# Patient Record
Sex: Female | Born: 1945 | Race: White | Hispanic: No | Marital: Single | Smoking: Former smoker
Health system: Southern US, Community
[De-identification: ages and names within clinical notes are randomized; demographics above are authoritative.]

## PROBLEM LIST (undated history)

## (undated) DIAGNOSIS — C569 Malignant neoplasm of unspecified ovary: Secondary | ICD-10-CM

## (undated) DIAGNOSIS — I82409 Acute embolism and thrombosis of unspecified deep veins of unspecified lower extremity: Secondary | ICD-10-CM

## (undated) DIAGNOSIS — N179 Acute kidney failure, unspecified: Secondary | ICD-10-CM

## (undated) HISTORY — PX: TONSILLECTOMY: SUR1361

## (undated) HISTORY — DX: Acute kidney failure, unspecified: N17.9

## (undated) HISTORY — PX: OTHER SURGICAL HISTORY: SHX169

## (undated) HISTORY — DX: Acute embolism and thrombosis of unspecified deep veins of unspecified lower extremity: I82.409

---

## 2004-12-30 HISTORY — PX: OTHER SURGICAL HISTORY: SHX169

## 2005-03-29 ENCOUNTER — Ambulatory Visit (HOSPITAL_BASED_OUTPATIENT_CLINIC_OR_DEPARTMENT_OTHER): Admission: RE | Admit: 2005-03-29 | Discharge: 2005-03-29 | Payer: Self-pay | Admitting: Orthopedic Surgery

## 2005-03-29 ENCOUNTER — Ambulatory Visit (HOSPITAL_COMMUNITY): Admission: RE | Admit: 2005-03-29 | Discharge: 2005-03-29 | Payer: Self-pay | Admitting: Orthopedic Surgery

## 2018-12-27 ENCOUNTER — Inpatient Hospital Stay (HOSPITAL_COMMUNITY)
Admission: EM | Admit: 2018-12-27 | Discharge: 2019-01-08 | DRG: 253 | Disposition: A | Discharge status: Skilled Nursing Facility | Payer: Medicare Other | Attending: Internal Medicine | Admitting: Internal Medicine

## 2018-12-27 ENCOUNTER — Emergency Department (HOSPITAL_BASED_OUTPATIENT_CLINIC_OR_DEPARTMENT_OTHER): Payer: Medicare Other

## 2018-12-27 ENCOUNTER — Other Ambulatory Visit: Payer: Self-pay

## 2018-12-27 ENCOUNTER — Emergency Department (HOSPITAL_COMMUNITY): Payer: Medicare Other

## 2018-12-27 ENCOUNTER — Encounter (HOSPITAL_COMMUNITY): Payer: Self-pay

## 2018-12-27 DIAGNOSIS — R180 Malignant ascites: Secondary | ICD-10-CM | POA: Diagnosis present

## 2018-12-27 DIAGNOSIS — D72829 Elevated white blood cell count, unspecified: Secondary | ICD-10-CM | POA: Diagnosis present

## 2018-12-27 DIAGNOSIS — I82409 Acute embolism and thrombosis of unspecified deep veins of unspecified lower extremity: Secondary | ICD-10-CM | POA: Diagnosis present

## 2018-12-27 DIAGNOSIS — R64 Cachexia: Secondary | ICD-10-CM | POA: Diagnosis present

## 2018-12-27 DIAGNOSIS — I82412 Acute embolism and thrombosis of left femoral vein: Secondary | ICD-10-CM | POA: Diagnosis present

## 2018-12-27 DIAGNOSIS — D638 Anemia in other chronic diseases classified elsewhere: Secondary | ICD-10-CM | POA: Diagnosis present

## 2018-12-27 DIAGNOSIS — I824Y9 Acute embolism and thrombosis of unspecified deep veins of unspecified proximal lower extremity: Secondary | ICD-10-CM

## 2018-12-27 DIAGNOSIS — Z23 Encounter for immunization: Secondary | ICD-10-CM | POA: Diagnosis not present

## 2018-12-27 DIAGNOSIS — R188 Other ascites: Secondary | ICD-10-CM

## 2018-12-27 DIAGNOSIS — R1907 Generalized intra-abdominal and pelvic swelling, mass and lump: Secondary | ICD-10-CM

## 2018-12-27 DIAGNOSIS — I824Y2 Acute embolism and thrombosis of unspecified deep veins of left proximal lower extremity: Secondary | ICD-10-CM

## 2018-12-27 DIAGNOSIS — R1900 Intra-abdominal and pelvic swelling, mass and lump, unspecified site: Secondary | ICD-10-CM

## 2018-12-27 DIAGNOSIS — K5900 Constipation, unspecified: Secondary | ICD-10-CM | POA: Diagnosis present

## 2018-12-27 DIAGNOSIS — E440 Moderate protein-calorie malnutrition: Secondary | ICD-10-CM | POA: Diagnosis present

## 2018-12-27 DIAGNOSIS — E860 Dehydration: Secondary | ICD-10-CM | POA: Diagnosis present

## 2018-12-27 DIAGNOSIS — Z87891 Personal history of nicotine dependence: Secondary | ICD-10-CM | POA: Diagnosis not present

## 2018-12-27 DIAGNOSIS — Z6827 Body mass index (BMI) 27.0-27.9, adult: Secondary | ICD-10-CM

## 2018-12-27 DIAGNOSIS — N838 Other noninflammatory disorders of ovary, fallopian tube and broad ligament: Secondary | ICD-10-CM

## 2018-12-27 DIAGNOSIS — C569 Malignant neoplasm of unspecified ovary: Secondary | ICD-10-CM | POA: Diagnosis present

## 2018-12-27 DIAGNOSIS — N179 Acute kidney failure, unspecified: Secondary | ICD-10-CM | POA: Diagnosis present

## 2018-12-27 DIAGNOSIS — R0602 Shortness of breath: Secondary | ICD-10-CM

## 2018-12-27 DIAGNOSIS — Z880 Allergy status to penicillin: Secondary | ICD-10-CM | POA: Diagnosis not present

## 2018-12-27 HISTORY — DX: Malignant neoplasm of unspecified ovary: C56.9

## 2018-12-27 LAB — CBC WITH DIFFERENTIAL/PLATELET
Abs Immature Granulocytes: 0.11 10*3/uL — ABNORMAL HIGH (ref 0.00–0.07)
Basophils Absolute: 0 10*3/uL (ref 0.0–0.1)
Basophils Relative: 0 %
Eosinophils Absolute: 0 10*3/uL (ref 0.0–0.5)
Eosinophils Relative: 0 %
HCT: 44.6 % (ref 36.0–46.0)
Hemoglobin: 14.2 g/dL (ref 12.0–15.0)
Immature Granulocytes: 1 %
LYMPHS ABS: 0.4 10*3/uL — AB (ref 0.7–4.0)
Lymphocytes Relative: 3 %
MCH: 29.1 pg (ref 26.0–34.0)
MCHC: 31.8 g/dL (ref 30.0–36.0)
MCV: 91.4 fL (ref 80.0–100.0)
Monocytes Absolute: 1.2 10*3/uL — ABNORMAL HIGH (ref 0.1–1.0)
Monocytes Relative: 9 %
NEUTROS PCT: 87 %
Neutro Abs: 11.2 10*3/uL — ABNORMAL HIGH (ref 1.7–7.7)
Platelets: 172 10*3/uL (ref 150–400)
RBC: 4.88 MIL/uL (ref 3.87–5.11)
RDW: 13.3 % (ref 11.5–15.5)
WBC: 13 10*3/uL — ABNORMAL HIGH (ref 4.0–10.5)
nRBC: 0 % (ref 0.0–0.2)

## 2018-12-27 LAB — COMPREHENSIVE METABOLIC PANEL
ALT: 21 U/L (ref 0–44)
AST: 32 U/L (ref 15–41)
Albumin: 3.6 g/dL (ref 3.5–5.0)
Alkaline Phosphatase: 84 U/L (ref 38–126)
Anion gap: 17 — ABNORMAL HIGH (ref 5–15)
BUN: 38 mg/dL — ABNORMAL HIGH (ref 8–23)
CO2: 20 mmol/L — AB (ref 22–32)
Calcium: 10.1 mg/dL (ref 8.9–10.3)
Chloride: 101 mmol/L (ref 98–111)
Creatinine, Ser: 1.87 mg/dL — ABNORMAL HIGH (ref 0.44–1.00)
GFR calc Af Amer: 30 mL/min — ABNORMAL LOW (ref >60)
GFR calc non Af Amer: 26 mL/min — ABNORMAL LOW (ref >60)
GLUCOSE: 133 mg/dL — AB (ref 70–99)
Potassium: 4.7 mmol/L (ref 3.5–5.1)
SODIUM: 138 mmol/L (ref 135–145)
Total Bilirubin: 0.9 mg/dL (ref 0.3–1.2)
Total Protein: 9.2 g/dL — ABNORMAL HIGH (ref 6.5–8.1)

## 2018-12-27 LAB — PROTIME-INR
INR: 1.22
Prothrombin Time: 15.3 seconds — ABNORMAL HIGH (ref 11.4–15.2)

## 2018-12-27 LAB — LIPASE, BLOOD: Lipase: 34 U/L (ref 11–51)

## 2018-12-27 MED ORDER — ONDANSETRON HCL 4 MG/2ML IJ SOLN
4.0000 mg | Freq: Four times a day (QID) | INTRAMUSCULAR | Status: DC | PRN
  Administered 2018-12-27 – 2018-12-28 (×2): 4 mg via INTRAVENOUS
  Filled 2018-12-27 (×2): qty 2

## 2018-12-27 MED ORDER — HEPARIN (PORCINE) 25000 UT/250ML-% IV SOLN
1150.0000 [IU]/h | INTRAVENOUS | Status: DC
  Administered 2018-12-27: 1150 [IU]/h via INTRAVENOUS
  Filled 2018-12-27: qty 250

## 2018-12-27 MED ORDER — PNEUMOCOCCAL VAC POLYVALENT 25 MCG/0.5ML IJ INJ
0.5000 mL | INJECTION | INTRAMUSCULAR | Status: DC
  Filled 2018-12-27: qty 0.5

## 2018-12-27 MED ORDER — HYDRALAZINE HCL 20 MG/ML IJ SOLN
10.0000 mg | Freq: Four times a day (QID) | INTRAMUSCULAR | Status: DC | PRN
  Administered 2018-12-27: 10 mg via INTRAVENOUS
  Filled 2018-12-27: qty 1

## 2018-12-27 MED ORDER — HEPARIN BOLUS VIA INFUSION
2000.0000 [IU] | Freq: Once | INTRAVENOUS | Status: AC
  Administered 2018-12-27: 2000 [IU] via INTRAVENOUS
  Filled 2018-12-27: qty 2000

## 2018-12-27 MED ORDER — SODIUM CHLORIDE 0.9 % IV SOLN
INTRAVENOUS | Status: DC
  Administered 2018-12-27 – 2018-12-31 (×9): via INTRAVENOUS

## 2018-12-27 MED ORDER — BISMUTH SUBSALICYLATE 262 MG/15ML PO SUSP: 30.0000 mL | ORAL | Status: DC | PRN

## 2018-12-27 MED ORDER — ACETAMINOPHEN 650 MG RE SUPP: 650.0000 mg | Freq: Four times a day (QID) | RECTAL | Status: DC | PRN

## 2018-12-27 MED ORDER — ONDANSETRON HCL 4 MG/2ML IJ SOLN
4.0000 mg | Freq: Once | INTRAMUSCULAR | Status: DC
  Filled 2018-12-27: qty 2

## 2018-12-27 MED ORDER — ONDANSETRON HCL 4 MG PO TABS: 4.0000 mg | ORAL_TABLET | Freq: Four times a day (QID) | ORAL | Status: DC | PRN

## 2018-12-27 MED ORDER — ACETAMINOPHEN 325 MG PO TABS
650.0000 mg | ORAL_TABLET | Freq: Four times a day (QID) | ORAL | Status: DC | PRN
  Filled 2018-12-27: qty 2

## 2018-12-27 NOTE — ED Notes (Signed)
Patient transported to CT 

## 2018-12-27 NOTE — Consult Note (Signed)
Reason for Consult: Newly diagnosed abdominal/pelvic mass Referring Physician: Lennice Sites, DO  Haley Palmer is an 72 y.o. female.  HPI:  She presented to the ED with increasing abdominal girth and LLE swelling for several months. She has associated DOE and decreased appetite. She endorses difficulty with AOD. She denies N/V, early satiety, bloating, weight loss, C/P.   Labs are suggestive of renal insufficiency--possible AKI/prerenal.  A CT scan showed a large abdominal/pelvic mass, 29 cm--felt to originate possibly from the right adnexa with large volume ascites, no lymphadenopathy.  Multiple pulmonary nodules were noted and were not felt to represent metastatic disease.  Duplex doppler study of the LE revealed a LLE DVT.  There is a family h/o colon and breast malignancies. History reviewed. No pertinent past medical history.  Past Surgical History:  Procedure Laterality Date  . CESAREAN SECTION    . Cyst removal on Left Wrist Left   . Surgery on Right Wrist Right 2006    No family history on file.  Social History:  reports that she has quit smoking. Her smoking use included cigarettes. She smoked 1.00 pack per day. She has never used smokeless tobacco. She reports that she does not drink alcohol or use drugs.  Allergies:  Allergies  Allergen Reactions  . Penicillins Rash    DID THE REACTION INVOLVE: Swelling of the face/tongue/throat, SOB, or low BP? No Sudden or severe rash/hives, skin peeling, or the inside of the mouth or nose? No Did it require medical treatment? No When did it last happen?childhood allergy If all above answers are "NO", may proceed with cephalosporin use.     Medications: I have reviewed the patient's current medications.  Results for orders placed or performed during the hospital encounter of 12/27/18 (from the past 48 hour(s))  Comprehensive metabolic panel     Status: Abnormal   Collection Time: 12/27/18 11:08 AM  Result Value Ref Range    Sodium 138 135 - 145 mmol/L   Potassium 4.7 3.5 - 5.1 mmol/L   Chloride 101 98 - 111 mmol/L   CO2 20 (L) 22 - 32 mmol/L   Glucose, Bld 133 (H) 70 - 99 mg/dL   BUN 38 (H) 8 - 23 mg/dL   Creatinine, Ser 1.87 (H) 0.44 - 1.00 mg/dL   Calcium 10.1 8.9 - 10.3 mg/dL   Total Protein 9.2 (H) 6.5 - 8.1 g/dL   Albumin 3.6 3.5 - 5.0 g/dL   AST 32 15 - 41 U/L   ALT 21 0 - 44 U/L   Alkaline Phosphatase 84 38 - 126 U/L   Total Bilirubin 0.9 0.3 - 1.2 mg/dL   GFR calc non Af Amer 26 (L) >60 mL/min   GFR calc Af Amer 30 (L) >60 mL/min   Anion gap 17 (H) 5 - 15    Comment: Performed at [facility], [place] [address], [place], [place] [address]  Lipase, blood     Status: None   Collection Time: 12/27/18 11:08 AM  Result Value Ref Range   Lipase 34 11 - 51 U/L    Comment: Performed at [facility], [place] [address] [place], [place] [address]  CBC with Diff     Status: Abnormal   Collection Time: 12/27/18 11:08 AM  Result Value Ref Range   WBC 13.0 (H) 4.0 - 10.5 K/uL   RBC 4.88 3.87 - 5.11 MIL/uL   Hemoglobin 14.2 12.0 - 15.0 g/dL   HCT 44.6 36.0 - 46.0 %  MCV 91.4 80.0 - 100.0 fL   MCH 29.1 26.0 - 34.0 pg   MCHC 31.8 30.0 - 36.0 g/dL   RDW 13.3 11.5 - 15.5 %   Platelets 172 150 - 400 K/uL   nRBC 0.0 0.0 - 0.2 %   Neutrophils Relative % 87 %   Neutro Abs 11.2 (H) 1.7 - 7.7 K/uL   Lymphocytes Relative 3 %   Lymphs Abs 0.4 (L) 0.7 - 4.0 K/uL   Monocytes Relative 9 %   Monocytes Absolute 1.2 (H) 0.1 - 1.0 K/uL   Eosinophils Relative 0 %   Eosinophils Absolute 0.0 0.0 - 0.5 K/uL   Basophils Relative 0 %   Basophils Absolute 0.0 0.0 - 0.1 K/uL   Immature Granulocytes 1 %   Abs Immature Granulocytes 0.11 (H) 0.00 - 0.07 K/uL    Comment: Performed at [facility], [place] [address], [place], [place] [address]  Protime-INR     Status: Abnormal   Collection Time: 12/27/18 11:08 AM  Result Value Ref Range   Prothrombin Time 15.3 (H) 11.4 -  15.2 seconds   INR 1.22     Comment: Performed at [facility], [place] [address], [place], [place] [address]    Ct Abdomen Pelvis Wo Contrast  Result Date: 12/27/2018 CLINICAL DATA:  72 year old female with history of abdominal distension. Evaluate for potential mass. EXAM: CT CHEST, ABDOMEN AND PELVIS WITHOUT CONTRAST TECHNIQUE: Multidetector CT imaging of the chest, abdomen and pelvis was performed following the standard protocol without IV contrast. COMPARISON:  None. FINDINGS: CT CHEST FINDINGS Cardiovascular: Heart size is normal. There is no significant pericardial fluid, thickening or pericardial calcification. Atherosclerotic calcifications in the left anterior descending coronary artery. Mediastinum/Nodes: No pathologically enlarged mediastinal or hilar lymph nodes. Moderate-sized hiatal hernia. No axillary lymphadenopathy. Lungs/Pleura: Mild diffuse bronchial wall thickening with mild centrilobular and paraseptal emphysema. Innumerable tiny 1-3 mm pulmonary nodules scattered throughout the periphery of the lungs bilaterally, nonspecific. No larger more suspicious appearing pulmonary nodules or masses are noted. No acute consolidative airspace disease. No pleural effusions. Areas of linear scarring noted in the right middle lobe, inferior segment of the lingula and anterior aspect of the left lower lobe. Musculoskeletal: There are no aggressive appearing lytic or blastic lesions noted in the visualized portions of the skeleton. CT ABDOMEN PELVIS FINDINGS Hepatobiliary: No definite cystic or solid hepatic lesions are confidently identified on today's noncontrast CT examination. Gallbladder is not confidently identified may be surgically absent, or may be obscured by adjacent ascites. Pancreas: No definite pancreatic mass noted on today's noncontrast CT examination. No definite surrounding peripancreatic inflammatory changes. Spleen: Unremarkable. Adrenals/Urinary Tract: Unenhanced  appearance of the kidneys and bilateral adrenal glands is normal. No hydroureteronephrosis. Urinary bladder is poorly demonstrated, but unremarkable in appearance. Stomach/Bowel: Normal appearance of the stomach. No pathologic dilatation of small bowel or colon. Appendix is not confidently identified. Vascular/Lymphatic: Aortic atherosclerosis. No lymphadenopathy identified in the abdomen or pelvis on today's noncontrast CT examination. Reproductive: Unenhanced appearance of the uterus is unremarkable. In the central abdomen and pelvis there is a very large centrally low-attenuation rim enhancing mass (axial image 85 of series 2 and sagittal image 92 of series 7) measuring 28.7 x 25.6 x 25.6 cm. This appears potentially related to the right adnexal region, although the origin of this lesion is uncertain. Left ovary is not confidently identified. Other: Large volume of ascites.  No pneumoperitoneum. Musculoskeletal: There are no aggressive appearing lytic or blastic lesions noted in  the visualized portions of the skeleton. IMPRESSION: 1. 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 This is associated with a very large volume of (presumably malignant) ascites. 2. Multiple tiny 1-3 mm pulmonary nodules scattered throughout the lungs bilaterally. These are nonspecific, but favored to represent benign areas of mucoid impaction within terminal bronchioles. However, close attention on follow-up studies is recommended to ensure the stability or resolution of this finding, as the possibility of metastatic disease is not excluded. 3. Aortic atherosclerosis, in addition to left anterior descending coronary artery disease. Assessment for potential risk factor modification, dietary therapy or pharmacologic therapy may be warranted, if clinically indicated. Electronically Signed   By: Vinnie Langton M.D.   On: 12/27/2018 13:41    Ct Chest Wo Contrast  Result Date: 12/27/2018 CLINICAL DATA:  72 year old female with history of abdominal distension. Evaluate for potential mass. EXAM: CT CHEST, ABDOMEN AND PELVIS WITHOUT CONTRAST TECHNIQUE: Multidetector CT imaging of the chest, abdomen and pelvis was performed following the standard protocol without IV contrast. COMPARISON:  None. FINDINGS: CT CHEST FINDINGS Cardiovascular: Heart size is normal. There is no significant pericardial fluid, thickening or pericardial calcification. Atherosclerotic calcifications in the left anterior descending coronary artery. Mediastinum/Nodes: No pathologically enlarged mediastinal or hilar lymph nodes. Moderate-sized hiatal hernia. No axillary lymphadenopathy. Lungs/Pleura: Mild diffuse bronchial wall thickening with mild centrilobular and paraseptal emphysema. Innumerable tiny 1-3 mm pulmonary nodules scattered throughout the periphery of the lungs bilaterally, nonspecific. No larger more suspicious appearing pulmonary nodules or masses are noted. No acute consolidative airspace disease. No pleural effusions. Areas of linear scarring noted in the right middle lobe, inferior segment of the lingula and anterior aspect of the left lower lobe. Musculoskeletal: There are no aggressive appearing lytic or blastic lesions noted in the visualized portions of the skeleton. CT ABDOMEN PELVIS FINDINGS Hepatobiliary: No definite cystic or solid hepatic lesions are confidently identified on today's noncontrast CT examination. Gallbladder is not confidently identified may be surgically absent, or may be obscured by adjacent ascites. Pancreas: No definite pancreatic mass noted on today's noncontrast CT examination. No definite surrounding peripancreatic inflammatory changes. Spleen: Unremarkable. Adrenals/Urinary Tract: Unenhanced appearance of the kidneys and bilateral adrenal glands is normal. No hydroureteronephrosis. Urinary bladder is poorly demonstrated, but  unremarkable in appearance. Stomach/Bowel: Normal appearance of the stomach. No pathologic dilatation of small bowel or colon. Appendix is not confidently identified. Vascular/Lymphatic: Aortic atherosclerosis. No lymphadenopathy identified in the abdomen or pelvis on today's noncontrast CT examination. Reproductive: Unenhanced appearance of the uterus is unremarkable. In the central abdomen and pelvis there is a very large centrally low-attenuation rim enhancing mass (axial image 85 of series 2 and sagittal image 92 of series 7) measuring 28.7 x 25.6 x 25.6 cm. This appears potentially related to the right adnexal region, although the origin of this lesion is uncertain. Left ovary is not confidently identified. Other: Large volume of ascites.  No pneumoperitoneum. Musculoskeletal: There are no aggressive appearing lytic or blastic lesions noted in the visualized portions of the skeleton. IMPRESSION: 1. 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 This is associated with a very large volume of (presumably malignant) ascites. 2. Multiple tiny 1-3 mm pulmonary nodules scattered throughout the lungs bilaterally. These are nonspecific, but favored to represent benign areas of mucoid impaction within terminal bronchioles.  However, close attention on follow-up studies is recommended to ensure the stability or resolution of this finding, as the possibility of metastatic disease is not excluded. 3. Aortic atherosclerosis, in addition to left anterior descending coronary artery disease. Assessment for potential risk factor modification, dietary therapy or pharmacologic therapy may be warranted, if clinically indicated. Electronically Signed   By: Vinnie Langton M.D.   On: 12/27/2018 13:41   Dg Chest Portable 1 View  Result Date: 12/27/2018 CLINICAL DATA:  Abdominal distension and leg swelling EXAM: PORTABLE CHEST 1 VIEW  COMPARISON:  None. FINDINGS: Monitoring leads overlie the patient. Normal cardiac and mediastinal contours. No consolidative pulmonary opacities. No pleural effusion or pneumothorax. Thoracic spine degenerative changes. IMPRESSION: No active disease. Electronically Signed   By: Lovey Newcomer M.D.   On: 12/27/2018 11:48   Vas [place] Lower Extremity Venous (dvt) (only [facility])  Result Date: 12/27/2018  Lower Venous Study Indications: Pain, Swelling, and Edema.  Limitations: Body habitus and tightenedness due to edema. Performing Technologist: Lorina Rabon  Examination Guidelines: A complete evaluation includes B-mode imaging, spectral Doppler, color Doppler, and power Doppler as needed of all accessible portions of each vessel. Bilateral testing is considered an integral part of a complete examination. Limited examinations for reoccurring indications may be performed as noted.  Right Venous Findings: +---+---------------+---------+-----------+----------+-------+    CompressibilityPhasicitySpontaneityPropertiesSummary +---+---------------+---------+-----------+----------+-------+ CFVFull           Yes      Yes                          +---+---------------+---------+-----------+----------+-------+  Left Venous Findings: +---------+---------------+---------+-----------+----------+-----------------+          CompressibilityPhasicitySpontaneityPropertiesSummary           +---------+---------------+---------+-----------+----------+-----------------+ CFV      None           No       No                   Acute             +---------+---------------+---------+-----------+----------+-----------------+ SFJ      None                                         Acute             +---------+---------------+---------+-----------+----------+-----------------+ FV Prox  None                                         Acute              +---------+---------------+---------+-----------+----------+-----------------+ FV Mid   None                                         Acute             +---------+---------------+---------+-----------+----------+-----------------+ FV DistalNone                                         Acute             +---------+---------------+---------+-----------+----------+-----------------+ PFV  None                                         Age Indeterminate +---------+---------------+---------+-----------+----------+-----------------+ POP      None                                         Age Indeterminate +---------+---------------+---------+-----------+----------+-----------------+ PTV      None                                         Age Indeterminate +---------+---------------+---------+-----------+----------+-----------------+ PERO     None                                         Age Indeterminate +---------+---------------+---------+-----------+----------+-----------------+ GSV      None                                         Acute             +---------+---------------+---------+-----------+----------+-----------------+    Summary: Right: No evidence of common femoral vein obstruction. Left: Findings consistent with acute deep vein thrombosis involving the left common femoral vein, and left femoral vein. Findings consistent with acute superficial vein thrombosis involving the left great saphenous vein. Findings consistent with age indeterminate deep vein thrombosis involving the left proximal profunda vein, left popliteal vein, left posterior tibial vein, and left peroneal vein. Notice: Left external iliac vein also thrombosed . IVC cannot well visualized. No cystic structure found in the popliteal fossa.  *See table(s) above for measurements and observations.    Preliminary     Review of Systems  Constitutional: Positive for malaise/fatigue. Negative for weight  loss.  Respiratory: Positive for shortness of breath. Negative for cough and hemoptysis.   Cardiovascular: Negative for chest pain.  Gastrointestinal: Positive for abdominal pain. Negative for constipation, nausea and vomiting.  Musculoskeletal:       LLE swelling   Blood pressure (!) 181/81, pulse (!) 108, temperature (!) 97.3 F (36.3 C), temperature source Oral, resp. rate (!) 22, height 5\' 7"  (1.702 m), weight 79.4 kg, SpO2 99 %. Physical Exam  Constitutional: She is oriented to person, place, and time. No distress.  Thin  HENT:  Head: Normocephalic and atraumatic.  Neck: Neck supple.  Cardiovascular: Regular rhythm.  Respiratory: Breath sounds normal.  GI: There is no abdominal tenderness.  Marked distension  Musculoskeletal:     Comments: Marked LLE edema pedal--thigh  Lymphadenopathy:       Right: No inguinal adenopathy present.       Left: No inguinal adenopathy present.  Neurological: She is alert and oriented to person, place, and time.    Assessment/Plan: Constellation of signs, symptoms and imaging worrisome for an EOC w/a secondary LLE DVT.  AKI. Tachycardic--?2/2 relative intravascular volume depletion; consider a possible PE in light of the DVT  >Admit, supportive care >Pharmacy has been consulted for anticoagulation therapy/dosing in light of the renal insufficiency.  (UFH) is preferred, although some experts administer renally-dosed enoxaparin- with  monitoring of antifactor Xa levels.  >recommend paracentesis by IR--therapeutic/diagnostic for cytologic examination > Likely a candidate for either up-front primary cytoreductive surgery with placement of an IVC filter prior to surgery to prevent PE or ii) neoadjuvant chemotherapy followed by interval debulking surgery.  The approach will be determined by our team >consider a nutritionist consult >review the CA-125 result >Will follow with you  Lahoma Crocker 12/27/2018, 4:12 PM

## 2018-12-27 NOTE — ED Notes (Signed)
ED TO INPATIENT HANDOFF REPORT  Name/Age/Gender Haley Palmer 72 y.o. female  Code Status   Home/SNF/Other Home  Chief Complaint Abdominal Pain; Left Leg Adema  Level of Care/Admitting Diagnosis ED Disposition    ED Disposition Condition Comment   Admit  Hospital Area: [facility] [[id number]]  Level of Care: Med-Surg [16]  Diagnosis: Acute DVT (deep venous thrombosis) [facility]) [[id number]]  Admitting Physician: Patrecia Pour [phone]  Attending Physician: Patrecia Pour [phone]  Estimated length of stay: past midnight tomorrow  Certification:: I certify this patient will need inpatient services for at least 2 midnights  PT Class (Do Not Modify): Inpatient [101]  PT Acc Code (Do Not Modify): Private [1]       Medical History History reviewed. No pertinent past medical history.  Allergies Allergies  Allergen Reactions  . Penicillins Rash    DID THE REACTION INVOLVE: Swelling of the face/tongue/throat, SOB, or low BP? No Sudden or severe rash/hives, skin peeling, or the inside of the mouth or nose? No Did it require medical treatment? No When did it last happen?childhood allergy If all above answers are "NO", may proceed with cephalosporin use.     IV Location/Drains/Wounds Patient Lines/Drains/Airways Status   Active Line/Drains/Airways    Name:   Placement date:   Placement time:   Site:   Days:   Peripheral IV 12/27/18 Left Antecubital   12/27/18    1137    Antecubital   less than 1          Labs/Imaging Results for orders placed or performed during the hospital encounter of 12/27/18 (from the past 48 hour(s))  Comprehensive metabolic panel     Status: Abnormal   Collection Time: 12/27/18 11:08 AM  Result Value Ref Range   Sodium 138 135 - 145 mmol/L   Potassium 4.7 3.5 - 5.1 mmol/L   Chloride 101 98 - 111 mmol/L   CO2 20 (L) 22 - 32 mmol/L   Glucose, Bld 133 (H) 70 - 99 mg/dL   BUN 38 (H) 8 - 23 mg/dL   Creatinine, Ser 1.87 (H) 0.44 -  1.00 mg/dL   Calcium 10.1 8.9 - 10.3 mg/dL   Total Protein 9.2 (H) 6.5 - 8.1 g/dL   Albumin 3.6 3.5 - 5.0 g/dL   AST 32 15 - 41 U/L   ALT 21 0 - 44 U/L   Alkaline Phosphatase 84 38 - 126 U/L   Total Bilirubin 0.9 0.3 - 1.2 mg/dL   GFR calc non Af Amer 26 (L) >60 mL/min   GFR calc Af Amer 30 (L) >60 mL/min   Anion gap 17 (H) 5 - 15    Comment: Performed at [facility], [place] [address] [place], [place] [address]  Lipase, blood     Status: None   Collection Time: 12/27/18 11:08 AM  Result Value Ref Range   Lipase 34 11 - 51 U/L    Comment: Performed at [facility], [place] [address] [place], [place] [address]  CBC with Diff     Status: Abnormal   Collection Time: 12/27/18 11:08 AM  Result Value Ref Range   WBC 13.0 (H) 4.0 - 10.5 K/uL   RBC 4.88 3.87 - 5.11 MIL/uL   Hemoglobin 14.2 12.0 - 15.0 g/dL   HCT 44.6 36.0 - 46.0 %   MCV 91.4 80.0 - 100.0 fL   MCH 29.1 26.0 - 34.0 pg   MCHC 31.8 30.0 - 36.0 g/dL  RDW 13.3 11.5 - 15.5 %   Platelets 172 150 - 400 K/uL   nRBC 0.0 0.0 - 0.2 %   Neutrophils Relative % 87 %   Neutro Abs 11.2 (H) 1.7 - 7.7 K/uL   Lymphocytes Relative 3 %   Lymphs Abs 0.4 (L) 0.7 - 4.0 K/uL   Monocytes Relative 9 %   Monocytes Absolute 1.2 (H) 0.1 - 1.0 K/uL   Eosinophils Relative 0 %   Eosinophils Absolute 0.0 0.0 - 0.5 K/uL   Basophils Relative 0 %   Basophils Absolute 0.0 0.0 - 0.1 K/uL   Immature Granulocytes 1 %   Abs Immature Granulocytes 0.11 (H) 0.00 - 0.07 K/uL    Comment: Performed at [facility], [place] [address], [place], [place] [address]  Protime-INR     Status: Abnormal   Collection Time: 12/27/18 11:08 AM  Result Value Ref Range   Prothrombin Time 15.3 (H) 11.4 - 15.2 seconds   INR 1.22     Comment: Performed at [facility], [place] [address], [place], [place] [address]   Ct Abdomen Pelvis Wo Contrast  Result Date: 12/27/2018 CLINICAL DATA:  72 year old female  with history of abdominal distension. Evaluate for potential mass. EXAM: CT CHEST, ABDOMEN AND PELVIS WITHOUT CONTRAST TECHNIQUE: Multidetector CT imaging of the chest, abdomen and pelvis was performed following the standard protocol without IV contrast. COMPARISON:  None. FINDINGS: CT CHEST FINDINGS Cardiovascular: Heart size is normal. There is no significant pericardial fluid, thickening or pericardial calcification. Atherosclerotic calcifications in the left anterior descending coronary artery. Mediastinum/Nodes: No pathologically enlarged mediastinal or hilar lymph nodes. Moderate-sized hiatal hernia. No axillary lymphadenopathy. Lungs/Pleura: Mild diffuse bronchial wall thickening with mild centrilobular and paraseptal emphysema. Innumerable tiny 1-3 mm pulmonary nodules scattered throughout the periphery of the lungs bilaterally, nonspecific. No larger more suspicious appearing pulmonary nodules or masses are noted. No acute consolidative airspace disease. No pleural effusions. Areas of linear scarring noted in the right middle lobe, inferior segment of the lingula and anterior aspect of the left lower lobe. Musculoskeletal: There are no aggressive appearing lytic or blastic lesions noted in the visualized portions of the skeleton. CT ABDOMEN PELVIS FINDINGS Hepatobiliary: No definite cystic or solid hepatic lesions are confidently identified on today's noncontrast CT examination. Gallbladder is not confidently identified may be surgically absent, or may be obscured by adjacent ascites. Pancreas: No definite pancreatic mass noted on today's noncontrast CT examination. No definite surrounding peripancreatic inflammatory changes. Spleen: Unremarkable. Adrenals/Urinary Tract: Unenhanced appearance of the kidneys and bilateral adrenal glands is normal. No hydroureteronephrosis. Urinary bladder is poorly demonstrated, but unremarkable in appearance. Stomach/Bowel: Normal appearance of the stomach. No pathologic  dilatation of small bowel or colon. Appendix is not confidently identified. Vascular/Lymphatic: Aortic atherosclerosis. No lymphadenopathy identified in the abdomen or pelvis on today's noncontrast CT examination. Reproductive: Unenhanced appearance of the uterus is unremarkable. In the central abdomen and pelvis there is a very large centrally low-attenuation rim enhancing mass (axial image 85 of series 2 and sagittal image 92 of series 7) measuring 28.7 x 25.6 x 25.6 cm. This appears potentially related to the right adnexal region, although the origin of this lesion is uncertain. Left ovary is not confidently identified. Other: Large volume of ascites.  No pneumoperitoneum. Musculoskeletal: There are no aggressive appearing lytic or blastic lesions noted in the visualized portions of the skeleton. IMPRESSION: 1. 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 This is associated with a very large volume of (presumably malignant) ascites. 2. Multiple tiny 1-3 mm pulmonary nodules scattered throughout the lungs bilaterally. These are nonspecific, but favored to represent benign areas of mucoid impaction within terminal bronchioles. However, close attention on follow-up studies is recommended to ensure the stability or resolution of this finding, as the possibility of metastatic disease is not excluded. 3. Aortic atherosclerosis, in addition to left anterior descending coronary artery disease. Assessment for potential risk factor modification, dietary therapy or pharmacologic therapy may be warranted, if clinically indicated. Electronically Signed   By: Vinnie Langton M.D.   On: 12/27/2018 13:41   Ct Chest Wo Contrast  Result Date: 12/27/2018 CLINICAL DATA:  72 year old female with history of abdominal distension. Evaluate for potential mass. EXAM: CT CHEST, ABDOMEN AND PELVIS WITHOUT CONTRAST TECHNIQUE:  Multidetector CT imaging of the chest, abdomen and pelvis was performed following the standard protocol without IV contrast. COMPARISON:  None. FINDINGS: CT CHEST FINDINGS Cardiovascular: Heart size is normal. There is no significant pericardial fluid, thickening or pericardial calcification. Atherosclerotic calcifications in the left anterior descending coronary artery. Mediastinum/Nodes: No pathologically enlarged mediastinal or hilar lymph nodes. Moderate-sized hiatal hernia. No axillary lymphadenopathy. Lungs/Pleura: Mild diffuse bronchial wall thickening with mild centrilobular and paraseptal emphysema. Innumerable tiny 1-3 mm pulmonary nodules scattered throughout the periphery of the lungs bilaterally, nonspecific. No larger more suspicious appearing pulmonary nodules or masses are noted. No acute consolidative airspace disease. No pleural effusions. Areas of linear scarring noted in the right middle lobe, inferior segment of the lingula and anterior aspect of the left lower lobe. Musculoskeletal: There are no aggressive appearing lytic or blastic lesions noted in the visualized portions of the skeleton. CT ABDOMEN PELVIS FINDINGS Hepatobiliary: No definite cystic or solid hepatic lesions are confidently identified on today's noncontrast CT examination. Gallbladder is not confidently identified may be surgically absent, or may be obscured by adjacent ascites. Pancreas: No definite pancreatic mass noted on today's noncontrast CT examination. No definite surrounding peripancreatic inflammatory changes. Spleen: Unremarkable. Adrenals/Urinary Tract: Unenhanced appearance of the kidneys and bilateral adrenal glands is normal. No hydroureteronephrosis. Urinary bladder is poorly demonstrated, but unremarkable in appearance. Stomach/Bowel: Normal appearance of the stomach. No pathologic dilatation of small bowel or colon. Appendix is not confidently identified. Vascular/Lymphatic: Aortic atherosclerosis. No  lymphadenopathy identified in the abdomen or pelvis on today's noncontrast CT examination. Reproductive: Unenhanced appearance of the uterus is unremarkable. In the central abdomen and pelvis there is a very large centrally low-attenuation rim enhancing mass (axial image 85 of series 2 and sagittal image 92 of series 7) measuring 28.7 x 25.6 x 25.6 cm. This appears potentially related to the right adnexal region, although the origin of this lesion is uncertain. Left ovary is not confidently identified. Other: Large volume of ascites.  No pneumoperitoneum. Musculoskeletal: There are no aggressive appearing lytic or blastic lesions noted in the visualized portions of the skeleton. IMPRESSION: 1. 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 This is associated with a very large volume of (presumably malignant) ascites. 2. Multiple tiny 1-3 mm pulmonary nodules scattered throughout the lungs bilaterally. These are nonspecific, but favored to represent benign areas of mucoid impaction within terminal bronchioles. However, close attention on follow-up studies is recommended to ensure the stability or resolution of this finding, as the possibility of metastatic disease is not  excluded. 3. Aortic atherosclerosis, in addition to left anterior descending coronary artery disease. Assessment for potential risk factor modification, dietary therapy or pharmacologic therapy may be warranted, if clinically indicated. Electronically Signed   By: Vinnie Langton M.D.   On: 12/27/2018 13:41   Dg Chest Portable 1 View  Result Date: 12/27/2018 CLINICAL DATA:  Abdominal distension and leg swelling EXAM: PORTABLE CHEST 1 VIEW COMPARISON:  None. FINDINGS: Monitoring leads overlie the patient. Normal cardiac and mediastinal contours. No consolidative pulmonary opacities. No pleural effusion or pneumothorax. Thoracic spine degenerative  changes. IMPRESSION: No active disease. Electronically Signed   By: Lovey Newcomer M.D.   On: 12/27/2018 11:48   Vas [place] Lower Extremity Venous (dvt) (only [facility])  Result Date: 12/27/2018  Lower Venous Study Indications: Pain, Swelling, and Edema.  Limitations: Body habitus and tightenedness due to edema. Performing Technologist: Lorina Rabon  Examination Guidelines: A complete evaluation includes B-mode imaging, spectral Doppler, color Doppler, and power Doppler as needed of all accessible portions of each vessel. Bilateral testing is considered an integral part of a complete examination. Limited examinations for reoccurring indications may be performed as noted.  Right Venous Findings: +---+---------------+---------+-----------+----------+-------+    CompressibilityPhasicitySpontaneityPropertiesSummary +---+---------------+---------+-----------+----------+-------+ CFVFull           Yes      Yes                          +---+---------------+---------+-----------+----------+-------+  Left Venous Findings: +---------+---------------+---------+-----------+----------+-----------------+          CompressibilityPhasicitySpontaneityPropertiesSummary           +---------+---------------+---------+-----------+----------+-----------------+ CFV      None           No       No                   Acute             +---------+---------------+---------+-----------+----------+-----------------+ SFJ      None                                         Acute             +---------+---------------+---------+-----------+----------+-----------------+ FV Prox  None                                         Acute             +---------+---------------+---------+-----------+----------+-----------------+ FV Mid   None                                         Acute             +---------+---------------+---------+-----------+----------+-----------------+ FV DistalNone                                          Acute             +---------+---------------+---------+-----------+----------+-----------------+ PFV      None  Age Indeterminate +---------+---------------+---------+-----------+----------+-----------------+ POP      None                                         Age Indeterminate +---------+---------------+---------+-----------+----------+-----------------+ PTV      None                                         Age Indeterminate +---------+---------------+---------+-----------+----------+-----------------+ PERO     None                                         Age Indeterminate +---------+---------------+---------+-----------+----------+-----------------+ GSV      None                                         Acute             +---------+---------------+---------+-----------+----------+-----------------+    Summary: Right: No evidence of common femoral vein obstruction. Left: Findings consistent with acute deep vein thrombosis involving the left common femoral vein, and left femoral vein. Findings consistent with acute superficial vein thrombosis involving the left great saphenous vein. Findings consistent with age indeterminate deep vein thrombosis involving the left proximal profunda vein, left popliteal vein, left posterior tibial vein, and left peroneal vein. Notice: Left external iliac vein also thrombosed . IVC cannot well visualized. No cystic structure found in the popliteal fossa.  *See table(s) above for measurements and observations.    Preliminary    None  Pending Labs Unresulted Labs (From admission, onward)    Start     Ordered   12/28/18 0500  CBC  Daily,   R     12/27/18 1522   12/28/18 0000  Heparin level (unfractionated)  Once-Timed,   R     12/27/18 1522   12/27/18 1402  CA 125  Once,   R     12/27/18 1401   12/27/18 1108  Urinalysis, Routine w reflex microscopic  ONCE - STAT,   STAT      12/27/18 1109          Vitals/Pain Today's Vitals   12/27/18 1454 12/27/18 1500 12/27/18 1530 12/27/18 1533  BP: 140/74 (!) 152/93 (!) 157/101 (!) 157/101  Pulse: (!) 115 (!) 110 (!) 113 (!) 115  Resp: 18 (!) 23 15 18   Temp:      TempSrc:      SpO2: 97% 98% 96% 98%  Weight:      Height:      PainSc:        Isolation Precautions No active isolations  Medications Medications  ondansetron (ZOFRAN) injection 4 mg (has no administration in time range)  heparin bolus via infusion 2,000 Units (has no administration in time range)  heparin ADULT infusion 100 units/mL (25000 units/273mL sodium chloride 0.45%) (has no administration in time range)    Mobility walks

## 2018-12-27 NOTE — ED Notes (Signed)
Pt cannot use restroom at this time, aware urine specimen is needed.  

## 2018-12-27 NOTE — ED Notes (Signed)
Hospitalist at bedside with patient.

## 2018-12-27 NOTE — ED Notes (Signed)
Report given to [facility] for [name], Room [phone].

## 2018-12-27 NOTE — ED Provider Notes (Signed)
[facility] DEPT Provider Note   CSN: [id number] Arrival date & time: 12/27/18  1035     History   Chief Complaint Chief Complaint  Patient presents with  . Abdominal Distention    HPI Haley Palmer is a 72 y.o. female.  The history is provided by the patient.  Abdominal Cramping  This is a new problem. The current episode started more than 1 week ago. The problem occurs daily. The problem has been gradually worsening. Pertinent negatives include no chest pain, no abdominal pain, no headaches and no shortness of breath. Associated symptoms comments: Patient with increasing abdominal distension and LLE swelling for the last several months. Nothing aggravates the symptoms. Nothing relieves the symptoms. She has tried nothing for the symptoms. The treatment provided no relief.    History reviewed. No pertinent past medical history.  Patient Active Problem List   Diagnosis Date Noted  . Acute DVT (deep venous thrombosis) ([place]) 12/27/2018    Past Surgical History:  Procedure Laterality Date  . CESAREAN SECTION    . Cyst removal on Left Wrist Left   . Surgery on Right Wrist Right 2006     OB History   No obstetric history on file.      Home Medications    Prior to Admission medications   Medication Sig Start Date End Date Taking? Authorizing Provider  bismuth subsalicylate (PEPTO BISMOL) 262 MG/15ML suspension Take 30 mLs by mouth every 4 (four) hours as needed for indigestion.   Yes [provider]    Family History No family history on file.  Social History Social History   Tobacco Use  . Smoking status: Former Smoker    Packs/day: 1.00    Types: Cigarettes  . Smokeless tobacco: Never Used  . Tobacco comment: Has not smoked in about 1 week as of 2018/12/27  Substance Use Topics  . Alcohol use: Never    Frequency: Never  . Drug use: Never     Allergies   Penicillins   Review of Systems Review of Systems    Constitutional: Negative for chills and fever.  HENT: Negative for ear pain and sore throat.   Eyes: Negative for pain and visual disturbance.  Respiratory: Negative for cough and shortness of breath.   Cardiovascular: Positive for leg swelling. Negative for chest pain and palpitations.  Gastrointestinal: Positive for abdominal distention. Negative for abdominal pain, anal bleeding, blood in stool, constipation, diarrhea, nausea, rectal pain and vomiting.  Genitourinary: Negative for decreased urine volume, dysuria, hematuria, pelvic pain, urgency, vaginal bleeding, vaginal discharge and vaginal pain.  Musculoskeletal: Negative for arthralgias and back pain.  Skin: Negative for color change and rash.  Neurological: Negative for seizures, syncope and headaches.  All other systems reviewed and are negative.    Physical Exam Updated Vital Signs  ED Triage Vitals  Enc Vitals Group     BP 12/27/18 1046 (!) 135/107     Pulse Rate 12/27/18 1046 (!) 114     Resp 12/27/18 1046 18     Temp 12/27/18 1048 (!) 97.3 F (36.3 C)     Temp Source 12/27/18 1048 Oral     SpO2 12/27/18 1046 99 %     Weight 12/27/18 1050 175 lb (79.4 kg)     Height 12/27/18 1050 5\' 7"  (1.702 m)     Head Circumference --      Peak Flow --      Pain Score 12/27/18 1050 0     Pain  Loc --      Pain Edu? --      Excl. in [place]? --     Physical Exam Vitals signs and nursing note reviewed.  Constitutional:      General: She is not in acute distress.    Appearance: She is well-developed. She is not ill-appearing.  HENT:     Head: Normocephalic and atraumatic.     Right Ear: Tympanic membrane normal.     Nose: Nose normal.     Mouth/Throat:     Mouth: Mucous membranes are moist.  Eyes:     Extraocular Movements: Extraocular movements intact.     Conjunctiva/sclera: Conjunctivae normal.     Pupils: Pupils are equal, round, and reactive to light.  Neck:     Musculoskeletal: Neck supple.  Cardiovascular:     Rate  and Rhythm: Normal rate and regular rhythm.     Pulses: Normal pulses.     Heart sounds: Normal heart sounds. No murmur.  Pulmonary:     Effort: Pulmonary effort is normal. No respiratory distress.     Breath sounds: Normal breath sounds.  Abdominal:     General: There is distension (extreme swelling and distension to abdomen).     Palpations: Abdomen is soft. There is no mass.     Tenderness: There is no abdominal tenderness. There is no guarding or rebound.  Musculoskeletal:     Right lower leg: No edema.     Left lower leg: Edema present.  Skin:    General: Skin is warm and dry.     Capillary Refill: Capillary refill takes less than 2 seconds.  Neurological:     Mental Status: She is alert.  Psychiatric:        Mood and Affect: Mood normal.      ED Treatments / Results  Labs (all labs ordered are listed, but only abnormal results are displayed) Labs Reviewed  COMPREHENSIVE METABOLIC PANEL - Abnormal; Notable for the following components:      Result Value   CO2 20 (*)    Glucose, Bld 133 (*)    BUN 38 (*)    Creatinine, Ser 1.87 (*)    Total Protein 9.2 (*)    GFR calc non Af Amer 26 (*)    GFR calc Af Amer 30 (*)    Anion gap 17 (*)    All other components within normal limits  CBC WITH DIFFERENTIAL/PLATELET - Abnormal; Notable for the following components:   WBC 13.0 (*)    Neutro Abs 11.2 (*)    Lymphs Abs 0.4 (*)    Monocytes Absolute 1.2 (*)    Abs Immature Granulocytes 0.11 (*)    All other components within normal limits  PROTIME-INR - Abnormal; Notable for the following components:   Prothrombin Time 15.3 (*)    All other components within normal limits  LIPASE, BLOOD  URINALYSIS, ROUTINE W REFLEX MICROSCOPIC  CA 125  HEPARIN LEVEL (UNFRACTIONATED)    EKG None  Radiology Ct Abdomen Pelvis Wo Contrast  Result Date: 12/27/2018 CLINICAL DATA:  72 year old female with history of abdominal distension. Evaluate for potential mass. EXAM: CT CHEST,  ABDOMEN AND PELVIS WITHOUT CONTRAST TECHNIQUE: Multidetector CT imaging of the chest, abdomen and pelvis was performed following the standard protocol without IV contrast. COMPARISON:  None. FINDINGS: CT CHEST FINDINGS Cardiovascular: Heart size is normal. There is no significant pericardial fluid, thickening or pericardial calcification. Atherosclerotic calcifications in the left anterior descending coronary artery. Mediastinum/Nodes: No pathologically  enlarged mediastinal or hilar lymph nodes. Moderate-sized hiatal hernia. No axillary lymphadenopathy. Lungs/Pleura: Mild diffuse bronchial wall thickening with mild centrilobular and paraseptal emphysema. Innumerable tiny 1-3 mm pulmonary nodules scattered throughout the periphery of the lungs bilaterally, nonspecific. No larger more suspicious appearing pulmonary nodules or masses are noted. No acute consolidative airspace disease. No pleural effusions. Areas of linear scarring noted in the right middle lobe, inferior segment of the lingula and anterior aspect of the left lower lobe. Musculoskeletal: There are no aggressive appearing lytic or blastic lesions noted in the visualized portions of the skeleton. CT ABDOMEN PELVIS FINDINGS Hepatobiliary: No definite cystic or solid hepatic lesions are confidently identified on today's noncontrast CT examination. Gallbladder is not confidently identified may be surgically absent, or may be obscured by adjacent ascites. Pancreas: No definite pancreatic mass noted on today's noncontrast CT examination. No definite surrounding peripancreatic inflammatory changes. Spleen: Unremarkable. Adrenals/Urinary Tract: Unenhanced appearance of the kidneys and bilateral adrenal glands is normal. No hydroureteronephrosis. Urinary bladder is poorly demonstrated, but unremarkable in appearance. Stomach/Bowel: Normal appearance of the stomach. No pathologic dilatation of small bowel or colon. Appendix is not confidently identified.  Vascular/Lymphatic: Aortic atherosclerosis. No lymphadenopathy identified in the abdomen or pelvis on today's noncontrast CT examination. Reproductive: Unenhanced appearance of the uterus is unremarkable. In the central abdomen and pelvis there is a very large centrally low-attenuation rim enhancing mass (axial image 85 of series 2 and sagittal image 92 of series 7) measuring 28.7 x 25.6 x 25.6 cm. This appears potentially related to the right adnexal region, although the origin of this lesion is uncertain. Left ovary is not confidently identified. Other: Large volume of ascites.  No pneumoperitoneum. Musculoskeletal: There are no aggressive appearing lytic or blastic lesions noted in the visualized portions of the skeleton. IMPRESSION: 1. 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 This is associated with a very large volume of (presumably malignant) ascites. 2. Multiple tiny 1-3 mm pulmonary nodules scattered throughout the lungs bilaterally. These are nonspecific, but favored to represent benign areas of mucoid impaction within terminal bronchioles. However, close attention on follow-up studies is recommended to ensure the stability or resolution of this finding, as the possibility of metastatic disease is not excluded. 3. Aortic atherosclerosis, in addition to left anterior descending coronary artery disease. Assessment for potential risk factor modification, dietary therapy or pharmacologic therapy may be warranted, if clinically indicated. Electronically Signed   By: Vinnie Langton M.D.   On: 12/27/2018 13:41   Ct Chest Wo Contrast  Result Date: 12/27/2018 CLINICAL DATA:  72 year old female with history of abdominal distension. Evaluate for potential mass. EXAM: CT CHEST, ABDOMEN AND PELVIS WITHOUT CONTRAST TECHNIQUE: Multidetector CT imaging of the chest, abdomen and pelvis was performed following the  standard protocol without IV contrast. COMPARISON:  None. FINDINGS: CT CHEST FINDINGS Cardiovascular: Heart size is normal. There is no significant pericardial fluid, thickening or pericardial calcification. Atherosclerotic calcifications in the left anterior descending coronary artery. Mediastinum/Nodes: No pathologically enlarged mediastinal or hilar lymph nodes. Moderate-sized hiatal hernia. No axillary lymphadenopathy. Lungs/Pleura: Mild diffuse bronchial wall thickening with mild centrilobular and paraseptal emphysema. Innumerable tiny 1-3 mm pulmonary nodules scattered throughout the periphery of the lungs bilaterally, nonspecific. No larger more suspicious appearing pulmonary nodules or masses are noted. No acute consolidative airspace disease. No pleural effusions. Areas of linear scarring noted in the right middle lobe, inferior segment of the  lingula and anterior aspect of the left lower lobe. Musculoskeletal: There are no aggressive appearing lytic or blastic lesions noted in the visualized portions of the skeleton. CT ABDOMEN PELVIS FINDINGS Hepatobiliary: No definite cystic or solid hepatic lesions are confidently identified on today's noncontrast CT examination. Gallbladder is not confidently identified may be surgically absent, or may be obscured by adjacent ascites. Pancreas: No definite pancreatic mass noted on today's noncontrast CT examination. No definite surrounding peripancreatic inflammatory changes. Spleen: Unremarkable. Adrenals/Urinary Tract: Unenhanced appearance of the kidneys and bilateral adrenal glands is normal. No hydroureteronephrosis. Urinary bladder is poorly demonstrated, but unremarkable in appearance. Stomach/Bowel: Normal appearance of the stomach. No pathologic dilatation of small bowel or colon. Appendix is not confidently identified. Vascular/Lymphatic: Aortic atherosclerosis. No lymphadenopathy identified in the abdomen or pelvis on today's noncontrast CT examination.  Reproductive: Unenhanced appearance of the uterus is unremarkable. In the central abdomen and pelvis there is a very large centrally low-attenuation rim enhancing mass (axial image 85 of series 2 and sagittal image 92 of series 7) measuring 28.7 x 25.6 x 25.6 cm. This appears potentially related to the right adnexal region, although the origin of this lesion is uncertain. Left ovary is not confidently identified. Other: Large volume of ascites.  No pneumoperitoneum. Musculoskeletal: There are no aggressive appearing lytic or blastic lesions noted in the visualized portions of the skeleton. IMPRESSION: 1. 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 This is associated with a very large volume of (presumably malignant) ascites. 2. Multiple tiny 1-3 mm pulmonary nodules scattered throughout the lungs bilaterally. These are nonspecific, but favored to represent benign areas of mucoid impaction within terminal bronchioles. However, close attention on follow-up studies is recommended to ensure the stability or resolution of this finding, as the possibility of metastatic disease is not excluded. 3. Aortic atherosclerosis, in addition to left anterior descending coronary artery disease. Assessment for potential risk factor modification, dietary therapy or pharmacologic therapy may be warranted, if clinically indicated. Electronically Signed   By: Vinnie Langton M.D.   On: 12/27/2018 13:41   Dg Chest Portable 1 View  Result Date: 12/27/2018 CLINICAL DATA:  Abdominal distension and leg swelling EXAM: PORTABLE CHEST 1 VIEW COMPARISON:  None. FINDINGS: Monitoring leads overlie the patient. Normal cardiac and mediastinal contours. No consolidative pulmonary opacities. No pleural effusion or pneumothorax. Thoracic spine degenerative changes. IMPRESSION: No active disease. Electronically Signed   By: Lovey Newcomer M.D.   On:  12/27/2018 11:48   Vas [place] Lower Extremity Venous (dvt) (only [facility])  Result Date: 12/27/2018  Lower Venous Study Indications: Pain, Swelling, and Edema.  Limitations: Body habitus and tightenedness due to edema. Performing Technologist: Lorina Rabon  Examination Guidelines: A complete evaluation includes B-mode imaging, spectral Doppler, color Doppler, and power Doppler as needed of all accessible portions of each vessel. Bilateral testing is considered an integral part of a complete examination. Limited examinations for reoccurring indications may be performed as noted.  Right Venous Findings: +---+---------------+---------+-----------+----------+-------+    CompressibilityPhasicitySpontaneityPropertiesSummary +---+---------------+---------+-----------+----------+-------+ CFVFull           Yes      Yes                          +---+---------------+---------+-----------+----------+-------+  Left Venous Findings: +---------+---------------+---------+-----------+----------+-----------------+          CompressibilityPhasicitySpontaneityPropertiesSummary           +---------+---------------+---------+-----------+----------+-----------------+  CFV      None           No       No                   Acute             +---------+---------------+---------+-----------+----------+-----------------+ SFJ      None                                         Acute             +---------+---------------+---------+-----------+----------+-----------------+ FV Prox  None                                         Acute             +---------+---------------+---------+-----------+----------+-----------------+ FV Mid   None                                         Acute             +---------+---------------+---------+-----------+----------+-----------------+ FV DistalNone                                         Acute              +---------+---------------+---------+-----------+----------+-----------------+ PFV      None                                         Age Indeterminate +---------+---------------+---------+-----------+----------+-----------------+ POP      None                                         Age Indeterminate +---------+---------------+---------+-----------+----------+-----------------+ PTV      None                                         Age Indeterminate +---------+---------------+---------+-----------+----------+-----------------+ PERO     None                                         Age Indeterminate +---------+---------------+---------+-----------+----------+-----------------+ GSV      None                                         Acute             +---------+---------------+---------+-----------+----------+-----------------+    Summary: Right: No evidence of common femoral vein obstruction. Left: Findings consistent with acute deep vein thrombosis involving the left common femoral vein, and left femoral vein. Findings consistent with acute superficial vein thrombosis involving the left great saphenous vein. Findings consistent with age indeterminate  deep vein thrombosis involving the left proximal profunda vein, left popliteal vein, left posterior tibial vein, and left peroneal vein. Notice: Left external iliac vein also thrombosed . IVC cannot well visualized. No cystic structure found in the popliteal fossa.  *See table(s) above for measurements and observations.    Preliminary     Procedures .Critical Care Performed by: Lennice Sites, DO Authorized by: Lennice Sites, DO   Critical care provider statement:    Critical care time (minutes):  45   Critical care was necessary to treat or prevent imminent or life-threatening deterioration of the following conditions:  Circulatory failure (DVT )   Critical care was time spent personally by me on the following activities:   Discussions with consultants, discussions with primary provider, development of treatment plan with patient or surrogate, examination of patient, obtaining history from patient or surrogate, ordering and performing treatments and interventions, ordering and review of laboratory studies, ordering and review of radiographic studies, pulse oximetry, re-evaluation of patient's condition and review of old charts   I assumed direction of critical care for this patient from another provider in my specialty: no     (including critical care time)  Medications Ordered in ED Medications  ondansetron (ZOFRAN) injection 4 mg (has no administration in time range)  heparin bolus via infusion 2,000 Units (has no administration in time range)  heparin ADULT infusion 100 units/mL (25000 units/249mL sodium chloride 0.45%) (has no administration in time range)     Initial Impression / Assessment and Plan / ED Course  I have reviewed the triage vital signs and the nursing notes.  Pertinent labs & imaging results that were available during my care of the patient were reviewed by me and considered in my medical decision making (see chart for details).     Memori Sammon is a 72 year old female with no significant medical history who presents to the ED with abdominal distention.  Patient with mild tachycardia upon arrival.  Otherwise normal vitals.  No fever.  Patient with abdominal swelling for the last several months.  It is gotten to the point where she is unable to function at home.  Unable to walk around without getting short of breath.  Unable to take care of herself.  Patient lives at home alone.  She has significant swelling to the left side of her leg as well.  Overall concern for abdominal mass.  Patient does not abuse alcohol.  No jaundice.  Suspect likely ovarian mass.  Will get CT scan.  We will get lab work, urinalysis.  Patient given IV Zofran.  Patient with 28 x 25 x 28 cm mass in the central abdomen  and pelvis likely concerning for ovarian cancer.  There is likely malignant ascites as well.  Pulmonary nodules as well.  Patient had DVT study ordered as well that showed extensive left lower extremity DVT including the femoral vein, iliac.  Gynecology oncology Dr. Laurance Flatten was consulted and states that she will see the patient in their office tomorrow or tomorrow in the hospital if she is admitted.  Talked with Dr. Donnetta Hutching with vascular surgery and no need for any thrombolytics or lysis at this time.  After discussion we will start the patient on IV heparin instead.  Patient with discomfort, unable to take care of herself at home with multiple medical problems.  To admit the patient to hospitalist for extensive DVT and IV heparin.  This will allow her to get oncology plan, likely paracentesis with IR.  Remained hemodynamically  stable throughout my care.  Patient does have acute kidney injury as well.  May benefit from fluids as well as she is likely dehydrated.  Patient remained hemodynamically stable throughout my care and was admitted to hospitalist service in stable condition.  This chart was dictated using voice recognition software.  Despite best efforts to proofread,  errors can occur which can change the documentation meaning.   Final Clinical Impressions(s) / ED Diagnoses   Final diagnoses:  Generalized abdominal mass  Acute deep vein thrombosis (DVT) of femoral vein of left lower extremity (HCC)  AKI (acute kidney injury) [facility])    ED Discharge Orders    None       Lennice Sites, DO 12/27/18 1553

## 2018-12-27 NOTE — H&P (Signed)
History and Physical   Haley Palmer FGH:[id number] DOB: [birth year] DOA: 12/27/2018  Referring MD/NP/PA: Dr. Ronnald Nian, [place] PCP: None Outpatient Specialists: None  Patient coming from: Home  Chief Complaint: Abdominal swelling, left leg swelling and pain  HPI: Haley Palmer is a 72 y.o. female who presented to the ED with several months of abdominal enlargement/unintentional weight gain and 2 days of painful left leg swelling. She began to be unable to fit into jeans a couple months ago due to abdominal enlargement which has remained constant, worsening despite a low carb/fat, and high protein diet. This has made her feel more weak, but she only sought medical attention now because she woke up with a painful swollen left leg 2 days ago and her friend who lives in [place] called and told her to go to the ED.   ED Course: Distended abdomen, afebrile with mild leukocytosis. CT abdomen demonstrates an abdominal mass ~25 x 25 28cm and large ascites. U/S of the left leg demonstrates extensive DVT.  Review of Systems: Denies fever, chills, weight loss, changes in vision or hearing, headache, cough, sore throat, chest pain, palpitations, shortness of breath, vomiting, changes in bowel habits, blood in stool, change in bladder habits, myalgias, arthralgias, and rash, and per HPI. All others reviewed and are negative.   History reviewed. No pertinent past medical history. Past Surgical History:  Procedure Laterality Date  . CESAREAN SECTION    . Cyst removal on Left Wrist Left   . Surgery on Right Wrist Right 2006   - Remote former smoker, no EtOH, lives alone.  reports that she has quit smoking. Her smoking use included cigarettes. She smoked 1.00 pack per day. She has never used smokeless tobacco. She reports that she does not drink alcohol or use drugs. Allergies  Allergen Reactions  . Penicillins Rash    DID THE REACTION INVOLVE: Swelling of the face/tongue/throat, SOB, or low BP?  No Sudden or severe rash/hives, skin peeling, or the inside of the mouth or nose? No Did it require medical treatment? No When did it last happen?childhood allergy If all above answers are "NO", may proceed with cephalosporin use.    No family history on file. - Family history otherwise reviewed and not pertinent.  Prior to Admission medications   Medication Sig Start Date End Date Taking? Authorizing Provider  bismuth subsalicylate (PEPTO BISMOL) 262 MG/15ML suspension Take 30 mLs by mouth every 4 (four) hours as needed for indigestion.   Yes [provider]    Physical Exam: Vitals:   12/27/18 1530 12/27/18 1533 12/27/18 1607 12/27/18 1642  BP: (!) 157/101 (!) 157/101 (!) 181/81 (!) 179/91  Pulse: (!) 113 (!) 115 (!) 108 (!) 118  Resp: 15 18 (!) 22 20  Temp:    97.6 F (36.4 C)  TempSrc:      SpO2: 96% 98% 99% 99%  Weight:      Height:       Constitutional: Pleasant female in no distress, calm demeanor Eyes: Lids and conjunctivae normal, PERRL ENMT: Mucous membranes are moist. Posterior pharynx clear of any exudate or lesions. Fair dentition.  Neck: normal, supple, no masses, no thyromegaly Respiratory: Non-labored breathing room air, mildly tachypneic without accessory muscle use. Clear breath sounds to auscultation bilaterally Cardiovascular: Regular tachycardia, no murmurs, rubs, or gallops. No carotid bruits. No JVD. 3+ tender LLE edema. Palpable pedal pulses. Abdomen: Distended, tense, minimally diffusely tender  GU: No indwelling catheter Musculoskeletal: No clubbing / cyanosis. No joint deformity upper and  lower extremities. Good ROM, no contractures. Normal muscle tone.  Skin: Warm, dry. No jaundice, rashes, wounds, ulcers. Neurologic: CN II-XII grossly intact. No asterixis. Speech normal. No focal deficits in motor strength or sensation in all extremities.  Psychiatric: Alert and oriented x3. Normal judgment and insight. Mood euthymic with congruent  affect.   Labs on Admission: I have personally reviewed following labs and imaging studies  CBC: Recent Labs  Lab 12/27/18 1108  WBC 13.0*  NEUTROABS 11.2*  HGB 14.2  HCT 44.6  MCV 91.4  PLT 425   Basic Metabolic Panel: Recent Labs  Lab 12/27/18 1108  NA 138  K 4.7  CL 101  CO2 20*  GLUCOSE 133*  BUN 38*  CREATININE 1.87*  CALCIUM 10.1   GFR: Estimated Creatinine Clearance: 29.1 mL/min (A) (by C-G formula based on SCr of 1.87 mg/dL (H)). Liver Function Tests: Recent Labs  Lab 12/27/18 1108  AST 32  ALT 21  ALKPHOS 84  BILITOT 0.9  PROT 9.2*  ALBUMIN 3.6   Recent Labs  Lab 12/27/18 1108  LIPASE 34   Coagulation Profile: Recent Labs  Lab 12/27/18 1108  INR 1.22   Radiological Exams on Admission: Ct Abdomen Pelvis Wo Contrast  Result Date: 12/27/2018 CLINICAL DATA:  72 year old female with history of abdominal distension. Evaluate for potential mass. EXAM: CT CHEST, ABDOMEN AND PELVIS WITHOUT CONTRAST TECHNIQUE: Multidetector CT imaging of the chest, abdomen and pelvis was performed following the standard protocol without IV contrast. COMPARISON:  None. FINDINGS: CT CHEST FINDINGS Cardiovascular: Heart size is normal. There is no significant pericardial fluid, thickening or pericardial calcification. Atherosclerotic calcifications in the left anterior descending coronary artery. Mediastinum/Nodes: No pathologically enlarged mediastinal or hilar lymph nodes. Moderate-sized hiatal hernia. No axillary lymphadenopathy. Lungs/Pleura: Mild diffuse bronchial wall thickening with mild centrilobular and paraseptal emphysema. Innumerable tiny 1-3 mm pulmonary nodules scattered throughout the periphery of the lungs bilaterally, nonspecific. No larger more suspicious appearing pulmonary nodules or masses are noted. No acute consolidative airspace disease. No pleural effusions. Areas of linear scarring noted in the right middle lobe, inferior segment of the lingula and  anterior aspect of the left lower lobe. Musculoskeletal: There are no aggressive appearing lytic or blastic lesions noted in the visualized portions of the skeleton. CT ABDOMEN PELVIS FINDINGS Hepatobiliary: No definite cystic or solid hepatic lesions are confidently identified on today's noncontrast CT examination. Gallbladder is not confidently identified may be surgically absent, or may be obscured by adjacent ascites. Pancreas: No definite pancreatic mass noted on today's noncontrast CT examination. No definite surrounding peripancreatic inflammatory changes. Spleen: Unremarkable. Adrenals/Urinary Tract: Unenhanced appearance of the kidneys and bilateral adrenal glands is normal. No hydroureteronephrosis. Urinary bladder is poorly demonstrated, but unremarkable in appearance. Stomach/Bowel: Normal appearance of the stomach. No pathologic dilatation of small bowel or colon. Appendix is not confidently identified. Vascular/Lymphatic: Aortic atherosclerosis. No lymphadenopathy identified in the abdomen or pelvis on today's noncontrast CT examination. Reproductive: Unenhanced appearance of the uterus is unremarkable. In the central abdomen and pelvis there is a very large centrally low-attenuation rim enhancing mass (axial image 85 of series 2 and sagittal image 92 of series 7) measuring 28.7 x 25.6 x 25.6 cm. This appears potentially related to the right adnexal region, although the origin of this lesion is uncertain. Left ovary is not confidently identified. Other: Large volume of ascites.  No pneumoperitoneum. Musculoskeletal: There are no aggressive appearing lytic or blastic lesions noted in the visualized portions of the skeleton. IMPRESSION: 1. 28.7 x  25.6 x 25.6 cm mass in the central abdomen and pelvis, strongly favored to be of ovarian origin, highly concerning for ovarian neoplasm. This is associated with a very large volume of (presumably malignant) ascites. 2. Multiple tiny 1-3 mm pulmonary nodules  scattered throughout the lungs bilaterally. These are nonspecific, but favored to represent benign areas of mucoid impaction within terminal bronchioles. However, close attention on follow-up studies is recommended to ensure the stability or resolution of this finding, as the possibility of metastatic disease is not excluded. 3. Aortic atherosclerosis, in addition to left anterior descending coronary artery disease. Assessment for potential risk factor modification, dietary therapy or pharmacologic therapy may be warranted, if clinically indicated. Electronically Signed   By: Vinnie Langton M.D.   On: 12/27/2018 13:41   Ct Chest Wo Contrast  Result Date: 12/27/2018 CLINICAL DATA:  72 year old female with history of abdominal distension. Evaluate for potential mass. EXAM: CT CHEST, ABDOMEN AND PELVIS WITHOUT CONTRAST TECHNIQUE: Multidetector CT imaging of the chest, abdomen and pelvis was performed following the standard protocol without IV contrast. COMPARISON:  None. FINDINGS: CT CHEST FINDINGS Cardiovascular: Heart size is normal. There is no significant pericardial fluid, thickening or pericardial calcification. Atherosclerotic calcifications in the left anterior descending coronary artery. Mediastinum/Nodes: No pathologically enlarged mediastinal or hilar lymph nodes. Moderate-sized hiatal hernia. No axillary lymphadenopathy. Lungs/Pleura: Mild diffuse bronchial wall thickening with mild centrilobular and paraseptal emphysema. Innumerable tiny 1-3 mm pulmonary nodules scattered throughout the periphery of the lungs bilaterally, nonspecific. No larger more suspicious appearing pulmonary nodules or masses are noted. No acute consolidative airspace disease. No pleural effusions. Areas of linear scarring noted in the right middle lobe, inferior segment of the lingula and anterior aspect of the left lower lobe. Musculoskeletal: There are no aggressive appearing lytic or blastic lesions noted in the visualized  portions of the skeleton. CT ABDOMEN PELVIS FINDINGS Hepatobiliary: No definite cystic or solid hepatic lesions are confidently identified on today's noncontrast CT examination. Gallbladder is not confidently identified may be surgically absent, or may be obscured by adjacent ascites. Pancreas: No definite pancreatic mass noted on today's noncontrast CT examination. No definite surrounding peripancreatic inflammatory changes. Spleen: Unremarkable. Adrenals/Urinary Tract: Unenhanced appearance of the kidneys and bilateral adrenal glands is normal. No hydroureteronephrosis. Urinary bladder is poorly demonstrated, but unremarkable in appearance. Stomach/Bowel: Normal appearance of the stomach. No pathologic dilatation of small bowel or colon. Appendix is not confidently identified. Vascular/Lymphatic: Aortic atherosclerosis. No lymphadenopathy identified in the abdomen or pelvis on today's noncontrast CT examination. Reproductive: Unenhanced appearance of the uterus is unremarkable. In the central abdomen and pelvis there is a very large centrally low-attenuation rim enhancing mass (axial image 85 of series 2 and sagittal image 92 of series 7) measuring 28.7 x 25.6 x 25.6 cm. This appears potentially related to the right adnexal region, although the origin of this lesion is uncertain. Left ovary is not confidently identified. Other: Large volume of ascites.  No pneumoperitoneum. Musculoskeletal: There are no aggressive appearing lytic or blastic lesions noted in the visualized portions of the skeleton. IMPRESSION: 1. 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 This is associated with a very large volume of (presumably malignant) ascites. 2. Multiple tiny 1-3 mm pulmonary nodules scattered throughout the lungs bilaterally. These are nonspecific, but favored to represent benign areas of mucoid impaction within terminal  bronchioles. However, close attention on follow-up studies is recommended to  ensure the stability or resolution of this finding, as the possibility of metastatic disease is not excluded. 3. Aortic atherosclerosis, in addition to left anterior descending coronary artery disease. Assessment for potential risk factor modification, dietary therapy or pharmacologic therapy may be warranted, if clinically indicated. Electronically Signed   By: Vinnie Langton M.D.   On: 12/27/2018 13:41   Dg Chest Portable 1 View  Result Date: 12/27/2018 CLINICAL DATA:  Abdominal distension and leg swelling EXAM: PORTABLE CHEST 1 VIEW COMPARISON:  None. FINDINGS: Monitoring leads overlie the patient. Normal cardiac and mediastinal contours. No consolidative pulmonary opacities. No pleural effusion or pneumothorax. Thoracic spine degenerative changes. IMPRESSION: No active disease. Electronically Signed   By: Lovey Newcomer M.D.   On: 12/27/2018 11:48   Vas [place] Lower Extremity Venous (dvt) (only [facility])  Result Date: 12/27/2018  Lower Venous Study Indications: Pain, Swelling, and Edema.  Limitations: Body habitus and tightenedness due to edema. Performing Technologist: Lorina Rabon  Examination Guidelines: A complete evaluation includes B-mode imaging, spectral Doppler, color Doppler, and power Doppler as needed of all accessible portions of each vessel. Bilateral testing is considered an integral part of a complete examination. Limited examinations for reoccurring indications may be performed as noted.  Right Venous Findings: +---+---------------+---------+-----------+----------+-------+    CompressibilityPhasicitySpontaneityPropertiesSummary +---+---------------+---------+-----------+----------+-------+ CFVFull           Yes      Yes                          +---+---------------+---------+-----------+----------+-------+  Left Venous Findings:  +---------+---------------+---------+-----------+----------+-----------------+          CompressibilityPhasicitySpontaneityPropertiesSummary           +---------+---------------+---------+-----------+----------+-----------------+ CFV      None           No       No                   Acute             +---------+---------------+---------+-----------+----------+-----------------+ SFJ      None                                         Acute             +---------+---------------+---------+-----------+----------+-----------------+ FV Prox  None                                         Acute             +---------+---------------+---------+-----------+----------+-----------------+ FV Mid   None                                         Acute             +---------+---------------+---------+-----------+----------+-----------------+ FV DistalNone                                         Acute             +---------+---------------+---------+-----------+----------+-----------------+ PFV      None  Age Indeterminate +---------+---------------+---------+-----------+----------+-----------------+ POP      None                                         Age Indeterminate +---------+---------------+---------+-----------+----------+-----------------+ PTV      None                                         Age Indeterminate +---------+---------------+---------+-----------+----------+-----------------+ PERO     None                                         Age Indeterminate +---------+---------------+---------+-----------+----------+-----------------+ GSV      None                                         Acute             +---------+---------------+---------+-----------+----------+-----------------+    Summary: Right: No evidence of common femoral vein obstruction. Left: Findings consistent with acute deep vein thrombosis  involving the left common femoral vein, and left femoral vein. Findings consistent with acute superficial vein thrombosis involving the left great saphenous vein. Findings consistent with age indeterminate deep vein thrombosis involving the left proximal profunda vein, left popliteal vein, left posterior tibial vein, and left peroneal vein. Notice: Left external iliac vein also thrombosed . IVC cannot well visualized. No cystic structure found in the popliteal fossa.  *See table(s) above for measurements and observations.    Preliminary     Assessment/Plan Active Problems:   Acute DVT (deep venous thrombosis) (HCC)   Acute left leg DVT: U/S showed findings consistent with acute DVT in left CFV, femoral vein, SVT in saphenous vein, and age indeterminate thrombus in proximal profunda, popliteal, posterior tibial and peroneal veins. - Heparin gtt - Vascular surgery, Dr. Donnetta Hutching, consulted by EDP.   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 Delsa Sale consulted - Check cancer antigen  Weakness:  - PT/OT evaluations, pt may not be able to return home due to weakness.   Leukocytosis: Possibly due to DVT and/or malignancy.  - Urinalysis pending  Dehydration, suspected AKI (though not certain of baseline): Elevated anion gap and mildly decreased bicarbonate consistent with mild lactate elevation. - IV fluids to continue. Albumin within normal limits.  DVT prophylaxis: Heparin gtt  Code Status: Full  Family Communication: None at bedside Disposition Plan: Uncertain Consults called: EDP called vascular surgery, Dr. Donnetta Hutching. I spoke with Dr. Delsa Sale, GynOnc who will evaluate the patient either today or tomorrow.  Admission status: Inpatient. Due to severity of symptoms, degree of weakness and need for further work up, it is reasonable to expect an inpatient stay of 2 midnights.    Patrecia Pour, MD [facility] [email or web address] Password [email or web address] 12/27/2018, 5:50 PM

## 2018-12-27 NOTE — Progress Notes (Signed)
ANTICOAGULATION CONSULT NOTE - Initial Consult  Pharmacy Consult for IV heparin Indication: DVT  Allergies  Allergen Reactions  . Penicillins Rash    DID THE REACTION INVOLVE: Swelling of the face/tongue/throat, SOB, or low BP? No Sudden or severe rash/hives, skin peeling, or the inside of the mouth or nose? No Did it require medical treatment? No When did it last happen?childhood allergy If all above answers are "NO", may proceed with cephalosporin use.     Patient Measurements: Height: 5\' 7"  (170.2 cm) Weight: 175 lb (79.4 kg) IBW/kg (Calculated) : 61.6 Heparin Dosing Weight: 77  Vital Signs: Temp: 97.3 F (36.3 C) (12/29 1048) Temp Source: Oral (12/29 1048) BP: 152/93 (12/29 1500) Pulse Rate: 110 (12/29 1500)  Labs: Recent Labs    12/27/18 1108  HGB 14.2  HCT 44.6  PLT 172  LABPROT 15.3*  INR 1.22  CREATININE 1.87*    Estimated Creatinine Clearance: 29.1 mL/min (A) (by C-G formula based on SCr of 1.87 mg/dL (H)).   Medical History: History reviewed. No pertinent past medical history.  Medications:  Scheduled:  . ondansetron (ZOFRAN) IV  4 mg Intravenous Once   Infusions:    Assessment: 72 yo female with DVT to start IV heparin per Rx dosing. Baseline labs drawn.   Goal of Therapy:  Heparin level 0.3-0.7 units/ml Monitor platelets by anticoagulation protocol: Yes   Plan:  1) IV heparin 2000 unit bolus then 2) IV heparin 1150 units/hr 3) Check heparin level 8 hours after start of IV heparin 4) Daily heparin level and CBC   Kara Mead 12/27/2018,3:19 PM

## 2018-12-27 NOTE — ED Notes (Signed)
ED Provider at bedside. 

## 2018-12-27 NOTE — ED Triage Notes (Signed)
Patient comes from home. Patient arrived via [facility]. Patient called 911 due to increased Abdominal Distention and left leg swelling. Patient is in no pain and has no other complaints. Patient is AOx4 and Ambulatory.

## 2018-12-27 NOTE — Progress Notes (Addendum)
Left lower extremity venous duplex exam completed.     Positive for DVT .Findings consistent with Acute deep vein thrombosis involving the left common femoral vein, and left femoral vein. Findings consistent with acute superficial vein thrombosis involving the left great saphenous vein. Findings consistent with age indeterminate deep vein thrombosis involving the left proximal profunda vein, left popliteal vein, left posterior tibial vein, and left peroneal vein.     Notice: Left external iliac vein also thrombosed . IVC cannot well visualized.  Result discussed with Dr. Ronnald Nian in ED.  Please see preliminary notes on CV PROC under chart review. Haley Palmer H Haley Palmer(RDMS RVT) 12/27/18 3:27 PM

## 2018-12-27 NOTE — ED Notes (Signed)
Bed: WA20 Expected date:  Expected time:  Means of arrival:  Comments: abd distention

## 2018-12-28 ENCOUNTER — Inpatient Hospital Stay (HOSPITAL_COMMUNITY): Payer: Medicare Other

## 2018-12-28 DIAGNOSIS — R188 Other ascites: Secondary | ICD-10-CM

## 2018-12-28 DIAGNOSIS — I82412 Acute embolism and thrombosis of left femoral vein: Principal | ICD-10-CM

## 2018-12-28 DIAGNOSIS — N179 Acute kidney failure, unspecified: Secondary | ICD-10-CM

## 2018-12-28 DIAGNOSIS — R1907 Generalized intra-abdominal and pelvic swelling, mass and lump: Secondary | ICD-10-CM

## 2018-12-28 LAB — BASIC METABOLIC PANEL
Anion gap: 15 (ref 5–15)
BUN: 49 mg/dL — ABNORMAL HIGH (ref 8–23)
CO2: 18 mmol/L — ABNORMAL LOW (ref 22–32)
Calcium: 9.2 mg/dL (ref 8.9–10.3)
Chloride: 104 mmol/L (ref 98–111)
Creatinine, Ser: 2.38 mg/dL — ABNORMAL HIGH (ref 0.44–1.00)
GFR calc Af Amer: 23 mL/min — ABNORMAL LOW (ref >60)
GFR calc non Af Amer: 20 mL/min — ABNORMAL LOW (ref >60)
Glucose, Bld: 123 mg/dL — ABNORMAL HIGH (ref 70–99)
Potassium: 4.7 mmol/L (ref 3.5–5.1)
Sodium: 137 mmol/L (ref 135–145)

## 2018-12-28 LAB — URINALYSIS, ROUTINE W REFLEX MICROSCOPIC
Bilirubin Urine: NEGATIVE
Glucose, UA: NEGATIVE mg/dL
Ketones, ur: 5 mg/dL — AB
Nitrite: NEGATIVE
Protein, ur: 30 mg/dL — AB
Specific Gravity, Urine: 1.019 (ref 1.005–1.030)
pH: 5 (ref 5.0–8.0)

## 2018-12-28 LAB — BODY FLUID CELL COUNT WITH DIFFERENTIAL
Lymphs, Fluid: 14 %
Monocyte-Macrophage-Serous Fluid: 82 % (ref 50–90)
Neutrophil Count, Fluid: 4 % (ref 0–25)
Total Nucleated Cell Count, Fluid: 359 cu mm (ref 0–1000)

## 2018-12-28 LAB — CBC
HCT: 40.4 % (ref 36.0–46.0)
HEMOGLOBIN: 12.9 g/dL (ref 12.0–15.0)
MCH: 29.1 pg (ref 26.0–34.0)
MCHC: 31.9 g/dL (ref 30.0–36.0)
MCV: 91.2 fL (ref 80.0–100.0)
Platelets: 186 10*3/uL (ref 150–400)
RBC: 4.43 MIL/uL (ref 3.87–5.11)
RDW: 13.2 % (ref 11.5–15.5)
WBC: 16.8 10*3/uL — ABNORMAL HIGH (ref 4.0–10.5)
nRBC: 0 % (ref 0.0–0.2)

## 2018-12-28 LAB — HEPARIN LEVEL (UNFRACTIONATED)
HEPARIN UNFRACTIONATED: 0.26 [IU]/mL — AB (ref 0.30–0.70)
Heparin Unfractionated: 0.75 IU/mL — ABNORMAL HIGH (ref 0.30–0.70)
Heparin Unfractionated: 0.67 IU/mL (ref 0.30–0.70)

## 2018-12-28 LAB — ALBUMIN, PLEURAL OR PERITONEAL FLUID: Albumin, Fluid: 2 g/dL

## 2018-12-28 LAB — LACTATE DEHYDROGENASE, PLEURAL OR PERITONEAL FLUID: LD, Fluid: 710 U/L — ABNORMAL HIGH (ref 3–23)

## 2018-12-28 LAB — TSH: TSH: 1.162 u[IU]/mL (ref 0.350–4.500)

## 2018-12-28 MED ORDER — HEPARIN (PORCINE) 25000 UT/250ML-% IV SOLN
1150.0000 [IU]/h | INTRAVENOUS | Status: DC
  Administered 2018-12-29 – 2018-12-31 (×3): 1150 [IU]/h via INTRAVENOUS
  Filled 2018-12-28 (×4): qty 250

## 2018-12-28 MED ORDER — HEPARIN (PORCINE) 25000 UT/250ML-% IV SOLN
1050.0000 [IU]/h | INTRAVENOUS | Status: DC
  Administered 2018-12-28 (×2): 1050 [IU]/h via INTRAVENOUS
  Filled 2018-12-28 (×3): qty 250

## 2018-12-28 MED ORDER — LIDOCAINE HCL 1 % IJ SOLN
INTRAMUSCULAR | Status: AC
  Filled 2018-12-28: qty 10

## 2018-12-28 NOTE — Progress Notes (Signed)
ANTICOAGULATION CONSULT NOTE -  Consult  Pharmacy Consult for IV heparin Indication: DVT  Allergies  Allergen Reactions  . Penicillins Rash    DID THE REACTION INVOLVE: Swelling of the face/tongue/throat, SOB, or low BP? No Sudden or severe rash/hives, skin peeling, or the inside of the mouth or nose? No Did it require medical treatment? No When did it last happen?childhood allergy If all above answers are "NO", may proceed with cephalosporin use.    Patient Measurements: Height: 5\' 7"  (170.2 cm) Weight: 175 lb (79.4 kg) IBW/kg (Calculated) : 61.6 Heparin Dosing Weight: 77  Vital Signs: Temp: 98 F (36.7 C) (12/30 1340) Temp Source: Oral (12/30 1340) BP: 127/70 (12/30 1527) Pulse Rate: 99 (12/30 1340)  Labs: Recent Labs    12/27/18 1108 12/28/18 0206 12/28/18 1101 12/28/18 1902  HGB 14.2 12.9  --   --   HCT 44.6 40.4  --   --   PLT 172 186  --   --   LABPROT 15.3*  --   --   --   INR 1.22  --   --   --   HEPARINUNFRC  --  0.75* 0.67 0.26*  CREATININE 1.87* 2.38*  --   --     Estimated Creatinine Clearance: 22.8 mL/min (A) (by C-G formula based on SCr of 2.38 mg/dL (H)).   Medical History: History reviewed. No pertinent past medical history.  Assessment: 72 yo female with acute extensive left DVT to start IV heparin per Rx dosing. Baseline labs drawn.  GYN-[facility] rec IR for  Paracentesis and cytology of ascites.  Today, 12/30 0206 HL= 0.75 units/ml slightly above goal, no infusion issues and x1 episode of dark emesis per RN > decreased Heparin level to 1050 units/hr  H/H=12.9/40.4, plts = 186, Scr 1.87>2.38 1100 HL = 0.67 - therapeutic after heparin drip rate decreased to 1050 units/hr. No more reports of dark emesis. > Heparin rate was continued at 1050 units/hr 1900 HL = 0.26 units/ml, subtherapeutic. No problems with infusion per RN.  Previous levels may have been higher due to bolus and poor renal function. No emesis reported  Goal of Therapy:   Heparin level 0.3-0.7 units/ml Monitor platelets by anticoagulation protocol: Yes   Plan:   Increase Heparin rate to 1200 units/hr  Next Heparin level in am, Daily level  Daily Heparin level  Minda Ditto PharmD Pager [phone] 12/28/2018, 7:56 PM

## 2018-12-28 NOTE — Procedures (Signed)
Ultrasound-guided diagnostic and therapeutic paracentesis performed yielding 5 liters (maximum ordered) of slightly hazy, yellow fluid. No immediate complications.  A portion of the fluid was submitted to the lab for preordered studies. EBL none.

## 2018-12-28 NOTE — Progress Notes (Signed)
Patient has not voided today; NT bladder scanned patient and result was 28ml. Dr Lonny Prude on unit was was made aware. Donne Hazel, RN

## 2018-12-28 NOTE — Progress Notes (Signed)
ANTICOAGULATION CONSULT NOTE -  Consult  Pharmacy Consult for IV heparin Indication: DVT  Allergies  Allergen Reactions  . Penicillins Rash    DID THE REACTION INVOLVE: Swelling of the face/tongue/throat, SOB, or low BP? No Sudden or severe rash/hives, skin peeling, or the inside of the mouth or nose? No Did it require medical treatment? No When did it last happen?childhood allergy If all above answers are "NO", may proceed with cephalosporin use.     Patient Measurements: Height: 5\' 7"  (170.2 cm) Weight: 175 lb (79.4 kg) IBW/kg (Calculated) : 61.6 Heparin Dosing Weight: 77  Vital Signs: Temp: 98.1 F (36.7 C) (12/29 2113) Temp Source: Oral (12/29 2113) BP: 163/71 (12/29 2113) Pulse Rate: 112 (12/29 2113)  Labs: Recent Labs    12/27/18 1108 12/28/18 0206  HGB 14.2 12.9  HCT 44.6 40.4  PLT 172 186  LABPROT 15.3*  --   INR 1.22  --   HEPARINUNFRC  --  0.75*  CREATININE 1.87* 2.38*    Estimated Creatinine Clearance: 22.8 mL/min (A) (by C-G formula based on SCr of 2.38 mg/dL (H)).   Medical History: History reviewed. No pertinent past medical history.  Medications:  Scheduled:  . ondansetron (ZOFRAN) IV  4 mg Intravenous Once  . pneumococcal 23 valent vaccine  0.5 mL Intramuscular Tomorrow-1000   Infusions:  . sodium chloride Stopped (12/27/18 1840)  . heparin      Assessment: 72 yo female with DVT to start IV heparin per Rx dosing. Baseline labs drawn.   Today, 12/30 0215 HL= 0.75 units/ml slightly above goal, no infusion issues and x1 episode of dark emesis per RN. H/H=12.9/40.4, plts = 186 Scr 1.87>2.38  Goal of Therapy:  Heparin level 0.3-0.7 units/ml Monitor platelets by anticoagulation protocol: Yes   Plan:  Will decrease heparin drip to 1050 units/hr Recheck HL in 8 hours Daily HL/CBC  Lawana Pai R 12/28/2018,2:59 AM

## 2018-12-28 NOTE — Progress Notes (Signed)
Brief review of chart/discussion with Dr. Delsa Sale.  Would recommend IR for paracentesis and cytology on ascites. Continue to workup AKI.  Will see patient at end of today.  Precious Haws, MD Gyn Onc

## 2018-12-28 NOTE — Progress Notes (Signed)
PROGRESS NOTE  Haley Palmer RFF:[id number] DOB: 1946/01/08 DOA: 12/27/2018 PCP: Patient, No Pcp Per  HPI/Brief Narrative  Haley Palmer is a 72 y.o. year old female with no significant medical history who presented on 12/27/2018 with unintentional weight gain, enlarging abdomen, dyspnea on exertion for several months and new left leg swelling and pain over 2 days and was found to have ovarian mass with associated ascites and acute left leg DVT.  Subjective Denies any abdominal pain, no cough, no chest pain.  Assessment/Plan:  #Acute left leg DVT.  DVT ultrasound confirmed thrombosis of left common femoral vein and left femoral vein.  Currently on IV heparin drip, continue to monitor CBC.  Could be related to likely malignancy or decreased mobilization related to significant ascites  #Ascites, concern for malignant.  Status post 5 L removed by paracentesis.  Pending diagnostic ascitic fluid analysis.  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  Awaiting GYN oncology recommendations, pending CA-125 result  #Leukocytosis.  Remains afebrile, no localizing signs or symptoms of infection.  May be stress-induced related to significant ascites/DVT/possible malignancy.  Low threshold to initiate antibiotics, obtain cultures if has fever.  #AKI, initially suspected prerenal given diminished p.o. intake, buthas not improved with IV fluids.  Possible obstruction/hydronephrosis also possible, CT abdomen showed no hydronephrosis.  Patient has had minimal output despite IV fluids.  UA shows many bacteria but trace leukocytes and 0-5 WBC with no urinary symptoms.  Will insert urine catheter and monitor output, serial BMP, avoid nephrotoxins  #Scattered pulmonary nodules throughout lungs bilaterally.  Nonspecific, could be benign, will need follow-up imaging to ensure not metastatic disease   Cultures:  None    DVT  prophylaxis: Consultants: Gyne/onc, IR  Procedures:  Ultrasound-guided diagnostic and therapeutic paracentesis, 12/30, 5 L removed  Antimicrobials:  Code Status: Full code  Family Communication: No family at bedside  Disposition Plan: Monitor on IV heparin, further management of ovarian mass/diagnostic/treatment, pending recommendations, monitoring kidney function.        Objective: Vitals:   12/28/18 1439 12/28/18 1500 12/28/18 1513 12/28/18 1527  BP: 139/79 (!) 146/69 104/61 127/70  Pulse:      Resp:      Temp:      TempSrc:      SpO2:      Weight:      Height:        Intake/Output Summary (Last 24 hours) at 12/28/2018 1758 Last data filed at 12/28/2018 1756 Gross per 24 hour  Intake 2246.09 ml  Output 80 ml  Net 2166.09 ml   Filed Weights   12/27/18 1050  Weight: 79.4 kg    Exam:  Constitutional: Thin in upper extremities, elderly female in no distress Eyes: EOMI, anicteric, normal conjunctivae ENMT: Oropharynx with moist mucous membranes, Neck: FROM Cardiovascular: RRR no MRGs, with 2+ pitting edema of entire left leg Respiratory: Normal respiratory effort on room air, clear breath sounds  Abdomen: Taut, nontender to palpation, minimal bowel sounds, no fluid wave, significantly distended abdomen Skin: No rash ulcers, or lesions. Without skin tenting  Neurologic: Grossly no focal neuro deficit. Psychiatric:Appropriate affect, and mood. Mental status AAOx3  Data Reviewed: CBC: Recent Labs  Lab 12/27/18 1108 12/28/18 0206  WBC 13.0* 16.8*  NEUTROABS 11.2*  --   HGB 14.2 12.9  HCT 44.6 40.4  MCV 91.4 91.2  PLT 172 357   Basic Metabolic Panel: Recent Labs  Lab 12/27/18 1108 12/28/18 0206  NA  138 137  K 4.7 4.7  CL 101 104  CO2 20* 18*  GLUCOSE 133* 123*  BUN 38* 49*  CREATININE 1.87* 2.38*  CALCIUM 10.1 9.2   GFR: Estimated Creatinine Clearance: 22.8 mL/min (A) (by C-G formula based on SCr of 2.38 mg/dL (H)). Liver Function  Tests: Recent Labs  Lab 12/27/18 1108  AST 32  ALT 21  ALKPHOS 84  BILITOT 0.9  PROT 9.2*  ALBUMIN 3.6   Recent Labs  Lab 12/27/18 1108  LIPASE 34   No results for input(s): AMMONIA in the last 168 hours. Coagulation Profile: Recent Labs  Lab 12/27/18 1108  INR 1.22   Cardiac Enzymes: No results for input(s): CKTOTAL, CKMB, CKMBINDEX, TROPONINI in the last 168 hours. BNP (last 3 results) No results for input(s): PROBNP in the last 8760 hours. HbA1C: No results for input(s): HGBA1C in the last 72 hours. CBG: No results for input(s): GLUCAP in the last 168 hours. Lipid Profile: No results for input(s): CHOL, HDL, LDLCALC, TRIG, CHOLHDL, LDLDIRECT in the last 72 hours. Thyroid Function Tests: Recent Labs    12/28/18 0206  TSH 1.162   Anemia Panel: No results for input(s): VITAMINB12, FOLATE, FERRITIN, TIBC, IRON, RETICCTPCT in the last 72 hours. Urine analysis:    Component Value Date/Time   COLORURINE AMBER (A) 12/28/2018 0527   APPEARANCEUR CLOUDY (A) 12/28/2018 0527   LABSPEC 1.019 12/28/2018 0527   PHURINE 5.0 12/28/2018 0527   GLUCOSEU NEGATIVE 12/28/2018 0527   HGBUR MODERATE (A) 12/28/2018 0527   BILIRUBINUR NEGATIVE 12/28/2018 0527   KETONESUR 5 (A) 12/28/2018 0527   PROTEINUR 30 (A) 12/28/2018 0527   NITRITE NEGATIVE 12/28/2018 0527   LEUKOCYTESUR TRACE (A) 12/28/2018 0527   Sepsis Labs: @LABRCNTIP (procalcitonin:4,lacticidven:4)  )No results found for this or any previous visit (from the past 240 hour(s)).    Studies: US Paracentesis  Result Date: 12/28/2018 INDICATION: Patient with history of lower extremity DVT, abdomino-pelvic mass, renal insufficiency, ascites ; request made for diagnostic and therapeutic paracentesis up to 5 liters. EXAM: ULTRASOUND GUIDED DIAGNOSTIC AND THERAPEUTIC PARACENTESIS MEDICATIONS: None COMPLICATIONS: None immediate. PROCEDURE: Informed written consent was obtained from the patient after a discussion of the risks,  benefits and alternatives to treatment. A timeout was performed prior to the initiation of the procedure. Initial ultrasound scanning demonstrates a large amount of ascites within the left mid to lower abdominal quadrant. The left mid to lower abdomen was prepped and draped in the usual sterile fashion. 1% lidocaine was used for local anesthesia. Following this, a 19 gauge, 7-cm, Yueh catheter was introduced. An ultrasound image was saved for documentation purposes. The paracentesis was performed. The catheter was removed and a dressing was applied. The patient tolerated the procedure well without immediate post procedural complication. FINDINGS: A total of approximately 5 liters of slightly hazy, yellow fluid was removed. Samples were sent to the laboratory as requested by the clinical team. IMPRESSION: Successful ultrasound-guided diagnostic and therapeutic paracentesis yielding 5 liters of peritoneal fluid. Read by: Rowe Robert, PA-C Electronically Signed   By: Sandi Mariscal M.D.   On: 12/28/2018 15:45    Scheduled Meds: . lidocaine      . ondansetron (ZOFRAN) IV  4 mg Intravenous Once  . pneumococcal 23 valent vaccine  0.5 mL Intramuscular Tomorrow-1000    Continuous Infusions: . sodium chloride 100 mL/hr at 12/28/18 1717  . heparin 1,050 Units/hr (12/28/18 1716)     LOS: 1 day     Desiree Hane, MD [facility]  Pager [phone]  If 7PM-7AM, please contact night-coverage [email or web address] Password [facility] 12/28/2018, 5:58 PM

## 2018-12-28 NOTE — Progress Notes (Signed)
OT Cancellation Note  Patient Details Name: Haley Palmer MRN: [id number] DOB: June 03, 1946   Cancelled Treatment:    Reason Eval/Treat Not Completed: Patient not medically ready. Pt with new LE DVT and started Heparin 12/29 @ 1756. Will await clearance from MD prior to mobilizing. Thanks.   Ramond Dial, OT/L   Acute OT Clinical Specialist Acute Rehabilitation Services Pager [phone] Office [phone]  12/28/2018, 8:53 AM

## 2018-12-28 NOTE — Progress Notes (Signed)
ANTICOAGULATION CONSULT NOTE -  Consult  Pharmacy Consult for IV heparin Indication: DVT  Allergies  Allergen Reactions  . Penicillins Rash    DID THE REACTION INVOLVE: Swelling of the face/tongue/throat, SOB, or low BP? No Sudden or severe rash/hives, skin peeling, or the inside of the mouth or nose? No Did it require medical treatment? No When did it last happen?childhood allergy If all above answers are "NO", may proceed with cephalosporin use.     Patient Measurements: Height: 5\' 7"  (170.2 cm) Weight: 175 lb (79.4 kg) IBW/kg (Calculated) : 61.6 Heparin Dosing Weight: 77  Vital Signs: Temp: 97.7 F (36.5 C) (12/30 0528) Temp Source: Oral (12/30 0528) BP: 154/84 (12/30 0528) Pulse Rate: 111 (12/30 0528)  Labs: Recent Labs    12/27/18 1108 12/28/18 0206 12/28/18 1101  HGB 14.2 12.9  --   HCT 44.6 40.4  --   PLT 172 186  --   LABPROT 15.3*  --   --   INR 1.22  --   --   HEPARINUNFRC  --  0.75* 0.67  CREATININE 1.87* 2.38*  --     Estimated Creatinine Clearance: 22.8 mL/min (A) (by C-G formula based on SCr of 2.38 mg/dL (H)).   Medical History: History reviewed. No pertinent past medical history.  Assessment: 72 yo female with acute extensive left DVT to start IV heparin per Rx dosing. Baseline labs drawn.   Today, 12/30 0215 HL= 0.75 units/ml slightly above goal, no infusion issues and x1 episode of dark emesis per RN. H/H=12.9/40.4, plts = 186 Scr 1.87>2.38 11 am HL = 0.67 - therapeutic after heparin drip rate decreased to 1050 units/hr. No more reports of dark emesis. GYN-ONC rec IR for  Paracentesis and cytology of ascites.    Goal of Therapy:  Heparin level 0.3-0.7 units/ml Monitor platelets by anticoagulation protocol: Yes   Plan:  continue heparin drip at 1050 units/hr confirmatory HL in 8 hours Daily HL/CBC  Eudelia Bunch, Pharm.D [phone] 12/28/2018 11:39 AM

## 2018-12-28 NOTE — Progress Notes (Signed)
PT Cancellation Note  Patient Details Name: Haley Palmer MRN: [id number] DOB: [birth year]   Cancelled Treatment:    Reason Eval/Treat Not Completed: Medical issues which prohibited therapy, new DVT, Heparin started. Eval 12/31.   Claretha Cooper 12/28/2018, 9:02 AM [place] Pager [phone] Office [phone]

## 2018-12-29 LAB — CBC
HCT: 33.5 % — ABNORMAL LOW (ref 36.0–46.0)
Hemoglobin: 10.9 g/dL — ABNORMAL LOW (ref 12.0–15.0)
MCH: 29.4 pg (ref 26.0–34.0)
MCHC: 32.5 g/dL (ref 30.0–36.0)
MCV: 90.3 fL (ref 80.0–100.0)
PLATELETS: 184 10*3/uL (ref 150–400)
RBC: 3.71 MIL/uL — ABNORMAL LOW (ref 3.87–5.11)
RDW: 13.3 % (ref 11.5–15.5)
WBC: 13.2 10*3/uL — ABNORMAL HIGH (ref 4.0–10.5)
nRBC: 0 % (ref 0.0–0.2)

## 2018-12-29 LAB — BASIC METABOLIC PANEL
Anion gap: 8 (ref 5–15)
BUN: 60 mg/dL — ABNORMAL HIGH (ref 8–23)
CO2: 21 mmol/L — ABNORMAL LOW (ref 22–32)
Calcium: 8.8 mg/dL — ABNORMAL LOW (ref 8.9–10.3)
Chloride: 108 mmol/L (ref 98–111)
Creatinine, Ser: 2.11 mg/dL — ABNORMAL HIGH (ref 0.44–1.00)
GFR calc Af Amer: 26 mL/min — ABNORMAL LOW (ref >60)
GFR, EST NON AFRICAN AMERICAN: 23 mL/min — AB (ref >60)
Glucose, Bld: 111 mg/dL — ABNORMAL HIGH (ref 70–99)
POTASSIUM: 4.3 mmol/L (ref 3.5–5.1)
Sodium: 137 mmol/L (ref 135–145)

## 2018-12-29 LAB — PREALBUMIN: Prealbumin: 9.4 mg/dL — ABNORMAL LOW (ref 18–38)

## 2018-12-29 LAB — HEPARIN LEVEL (UNFRACTIONATED)
Heparin Unfractionated: 0.69 IU/mL (ref 0.30–0.70)
Heparin Unfractionated: 0.54 IU/mL (ref 0.30–0.70)

## 2018-12-29 LAB — GRAM STAIN

## 2018-12-29 LAB — CA 125
CANCER ANTIGEN (CA) 125: 40.2 U/mL — AB (ref 0.0–38.1)
Cancer Antigen (CA) 125: 31.9 U/mL (ref 0.0–38.1)

## 2018-12-29 NOTE — Progress Notes (Signed)
ANTICOAGULATION CONSULT NOTE - Follow Up Consult  Pharmacy Consult for Heparin Indication: DVT  Allergies  Allergen Reactions  . Penicillins Rash    DID THE REACTION INVOLVE: Swelling of the face/tongue/throat, SOB, or low BP? No Sudden or severe rash/hives, skin peeling, or the inside of the mouth or nose? No Did it require medical treatment? No When did it last happen?childhood allergy If all above answers are "NO", may proceed with cephalosporin use.     Patient Measurements: Height: 5\' 7"  (170.2 cm) Weight: 175 lb (79.4 kg) IBW/kg (Calculated) : 61.6 Heparin Dosing Weight:   Vital Signs: Temp: 98.4 F (36.9 C) (12/30 2141) Temp Source: Oral (12/30 2141) BP: 138/68 (12/30 2141) Pulse Rate: 95 (12/30 2141)  Labs: Recent Labs    12/27/18 1108  12/28/18 0206 12/28/18 1101 12/28/18 1902 12/29/18 0417  HGB 14.2  --  12.9  --   --  10.9*  HCT 44.6  --  40.4  --   --  33.5*  PLT 172  --  186  --   --  184  LABPROT 15.3*  --   --   --   --   --   INR 1.22  --   --   --   --   --   HEPARINUNFRC  --    < > 0.75* 0.67 0.26* 0.69  CREATININE 1.87*  --  2.38*  --   --   --    < > = values in this interval not displayed.    Estimated Creatinine Clearance: 22.8 mL/min (A) (by C-G formula based on SCr of 2.38 mg/dL (H)).   Medications:  Infusions:  . sodium chloride 100 mL/hr at 12/29/18 0153  . heparin 1,200 Units/hr (12/28/18 2057)    Assessment: Patient with heparin level at goal.  No heparin issues per RN.  While level is at goal, it is at highest level and will therefore reduce the drip to maintain within goal range.  Goal of Therapy:  Heparin level 0.3-0.7 units/ml Monitor platelets by anticoagulation protocol: Yes   Plan:  Decrease heparin to 1150 units/hr Recheck level at [address] [place] 12/29/2018,4:58 AM

## 2018-12-29 NOTE — Evaluation (Signed)
Physical Therapy Evaluation Patient Details Name: Haley Palmer MRN: [id number] DOB: May 31, 1946 Today's Date: 12/29/2018   History of Present Illness  72 year old female was admitted for abdominal and LLE swelling and pain.  Found to have LLE DVT and ovarian mass  Clinical Impression  The patient reports being very  Uncomfortable in abdomen and LLE. Patient assisted to to recliner with 2 assist. Pt admitted with above diagnosis. Pt currently with functional limitations due to the deficits listed below (see PT Problem List).  Pt will benefit from skilled PT to increase their independence and safety with mobility to allow discharge to the venue listed below.         Follow Up Recommendations SNF    Equipment Recommendations  (TBD)    Recommendations for Other Services       Precautions / Restrictions Precautions Precautions: Fall Precaution Comments: distented abdomen; DVT LLE Restrictions Weight Bearing Restrictions: No      Mobility  Bed Mobility Overal bed mobility: Needs Assistance Bed Mobility: Rolling;Sidelying to Sit Rolling: Min assist Sidelying to sit: Mod assist;+2 for physical assistance       General bed mobility comments: cues for sequence; light assistance for legs; more assist for trunk  Transfers Overall transfer level: Needs assistance Equipment used: Rolling walker (2 wheeled) Transfers: Sit to/from [facility] Sit to Stand: Min assist;+2 safety/equipment;+2 physical assistance;From elevated surface Stand pivot transfers: Min assist;+2 physical assistance;+2 safety/equipment       General transfer comment: cues for UE placement  Ambulation/Gait                Stairs            Wheelchair Mobility    Modified Rankin (Stroke Patients Only)       Balance Overall balance assessment: Needs assistance Sitting-balance support: Bilateral upper extremity supported Sitting balance-Leahy Scale: Fair Sitting  balance - Comments: initially leaning back due to abdomen, gradually  able to support self                                     Pertinent Vitals/Pain Pain Assessment: Faces Faces Pain Scale: Hurts little more Pain Location: L knee Pain Descriptors / Indicators: Aching Pain Intervention(s): Limited activity within patient's tolerance;Monitored during session;Repositioned(declines meds)    Home Living Family/patient expects to be discharged to:: Unsure Living Arrangements: Alone               Additional Comments: lives alone with cat.  Has standard toilet, tub-which she likes to get to bottom of    Prior Function Level of Independence: Independent               Hand Dominance        Extremity/Trunk Assessment   Upper Extremity Assessment Upper Extremity Assessment: Generalized weakness    Lower Extremity Assessment Lower Extremity Assessment: Generalized weakness       Communication   Communication: No difficulties  Cognition Arousal/Alertness: Awake/alert Behavior During Therapy: WFL for tasks assessed/performed Overall Cognitive Status: Within Functional Limits for tasks assessed                                        General Comments      Exercises     Assessment/Plan    PT Assessment Patient needs continued PT services  PT Problem List Decreased strength;Decreased activity tolerance;Decreased mobility;Decreased knowledge of precautions;Decreased safety awareness;Decreased knowledge of use of DME;Decreased cognition       PT Treatment Interventions DME instruction;Therapeutic exercise;Functional mobility training;Therapeutic activities;Patient/family education;Gait training    PT Goals (Current goals can be found in the Care Plan section)  Acute Rehab PT Goals Patient Stated Goal: return to independence; decreased pain/swelling PT Goal Formulation: With patient Time For Goal Achievement: 01/12/19 Potential to  Achieve Goals: Fair    Frequency Min 2X/week   Barriers to discharge Decreased caregiver support      Co-evaluation PT/OT/SLP Co-Evaluation/Treatment: Yes Reason for Co-Treatment: For patient/therapist safety PT goals addressed during session: Mobility/safety with mobility OT goals addressed during session: ADL's and self-care       AM-PAC PT "6 Clicks" Mobility  Outcome Measure Help needed turning from your back to your side while in a flat bed without using bedrails?: A Lot Help needed moving from lying on your back to sitting on the side of a flat bed without using bedrails?: A Lot Help needed moving to and from a bed to a chair (including a wheelchair)?: A Lot Help needed standing up from a chair using your arms (e.g., wheelchair or bedside chair)?: Total Help needed to walk in hospital room?: Total Help needed climbing 3-5 steps with a railing? : Total 6 Click Score: 9    End of Session   Activity Tolerance: Patient tolerated treatment well Patient left: in chair;with call bell/phone within reach Nurse Communication: Mobility status PT Visit Diagnosis: Unsteadiness on feet (R26.81)    Time: 0953-1010 PT Time Calculation (min) (ACUTE ONLY): 17 min   Charges:   PT Evaluation $PT Eval Low Complexity: 1 Low          [place] Pager [phone] Office [phone]   Claretha Cooper 12/29/2018, 1:53 PM

## 2018-12-29 NOTE — Progress Notes (Signed)
1528/1528-01  History reviewed. No pertinent past medical history.  HPI: She presented to the ED with increasing abdominal girth and LLE swelling for several months. She has associated DOE and decreased appetite. She endorses difficulty with AOD. She denies N/V, early satiety, bloating, weight loss, C/P.   Labs are suggestive of renal insufficiency--possible AKI/prerenal.  A CT scan showed a large abdominal/pelvic mass, 29 cm--felt to originate possibly from the right adnexa with large volume ascites, no lymphadenopathy.  Multiple pulmonary nodules were noted and were not felt to represent metastatic disease.  Duplex doppler study of the LE revealed a LLE DVT.  There is a family h/o colon and breast malignancies.  Has had no GYN care since birth of her last child. Denies bleeding.  Subjective: 12/28/18 - states feels much better post-paracentesis. Was able to eat more tonight for dinner. Has had no appetite lately. Has been eating small amounts only to avoid fullness. Intermittent episodes of emesis but with small meals she was able to control. 12/29/18 - eating more. Still feels relief post paracentesis. Having BM and flatus. No nausea.  Objective: Vital signs in last 24 hours: Temp:  [98.3 F (36.8 C)-98.6 F (37 C)] 98.6 F (37 C) (12/31 1338) Pulse Rate:  [74-80] 74 (12/31 1338) Resp:  [14-16] 16 (12/31 1338) BP: (122-127)/(63-69) 122/63 (12/31 1338) SpO2:  [96 %-99 %] 99 % (12/31 1338) Last BM Date: 12/25/18  Intake/Output from previous day: 12/30 0701 - 12/31 0700 In: 3424.2 [P.O.:1020; I.V.:2404.2] Out: 400 [Urine:400] No data found.   Physical Examination: General: alert, cooperative and no distress GI: soft, distended with mass. NT Extremities: edema left > right  Labs: WBC/Hgb/Hct/Plts:  13.2/10.9/33.5/184 (12/31 0417) BUN/Cr/glu/ALT/AST/amyl/lip:  60/2.11/--/--/--/--/-- (12/31 0417)  CBC Latest Ref Rng & Units 12/29/2018 12/28/2018 12/27/2018  WBC 4.0 -  10.5 K/uL 13.2(H) 16.8(H) 13.0(H)  Hemoglobin 12.0 - 15.0 g/dL 10.9(L) 12.9 14.2  Hematocrit 36.0 - 46.0 % 33.5(L) 40.4 44.6  Platelets 150 - 400 K/uL 184 186 172   BMP Latest Ref Rng & Units 12/29/2018 12/28/2018 12/27/2018  Glucose 70 - 99 mg/dL 111(H) 123(H) 133(H)  BUN 8 - 23 mg/dL 60(H) 49(H) 38(H)  Creatinine 0.44 - 1.00 mg/dL 2.11(H) 2.38(H) 1.87(H)  Sodium 135 - 145 mmol/L 137 137 138  Potassium 3.5 - 5.1 mmol/L 4.3 4.7 4.7  Chloride 98 - 111 mmol/L 108 104 101  CO2 22 - 32 mmol/L 21(L) 18(L) 20(L)  Calcium 8.9 - 10.3 mg/dL 8.8(L) 9.2 10.1      Assessment/Plan:  72 y.o. 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 At time of my visit with her today cytology was still pending. Now returns with adenoca. No immunostains to clarify further. 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 Will ultimately need laparotomy to resect and we'll need medical clearance prior IR has assessed for IVC filter  Pulmonary - lesions felt not to be c/w metastatic disease. Plan would be to monitor closely.  AKI - per primary team It would be ideal to see improvement in her renal function during this admission Fluctuations in creatinine. Agree with continued IVF hydration. Per primary team  ADL - She is cachectic appearing and I suspect malnourished, however albumin WNL. Will check prealbumin. I worry she is deconditioned and may do poorly with any large upfront debulking, however imaging does not show obvious extraovarian disease.  Plan for surgery would be mass excision in hopes this will improve her  performance status prior to likely chemo  DVT - per primary team Consider IVC filter if primary team thinks will be reasonable to plan surgery in the near future    LOS: 2 days    Isabel Caprice 12/29/2018, 10:06 PM

## 2018-12-29 NOTE — Consult Note (Signed)
Reason for Consult: Renal failure Referring Physician:  Dr. Oretha Milch  Chief Complaint:  Left leg swelling and pain  Assessment/Plan: 1. AKI on CKD? Unclear at this time given no labwork in 35 years and only time will tell where her true baseline is. Her urine microscopy is not significant for any active disease and by history she certainly has e/o volume depletion.  - Will check a UPC but not likely to be very high; if high then I will order a SPEP to evaluate for a paraprotein. - Renal function already slowly improving as a result of the isotonic fluid administration. There is no hydronephrosis or e/o obstruction on CT noncontrast. - Renal dose meds for a GFR 30-60 pending daily labs which are dynamic and already show signs of improvement. - Continue isotonic fluid at current rate; no e/o dyspnea. 2. 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 3. Acute left leg DVT on heparin gtt. 4. Ascites s/p 5L LVP and going again tomorrow.   HPI: Haley Palmer is an 72 y.o. female woman with a h/o enlarging abdomen which she think started before Thanksgiving. Before that she thought she was gaining weight and intentionally went on a high protein diet earlier in 2019. She thought her abd had increased in size bec she fell off the wagon with her diet and also noted that her clothes no longer fit. What brings her into the hospital is a painful swollen left leg which started 2 days prior to this current admission. She denies any long travel history recently, NSAID use, rashes, dysuria, decreased UOP, anorexia. She also denies any nephrolithiasis, frequent urinary tract infections or obstructive uropathy. She also denies f/c/n/v/dyspnea but has had mild more shallow breaths bec of the size of the abdomen. No oral ulcers, cough, photosensitivity, hemoptysis or blood tinged urine. She's never been told she has renal issues and has not had blood drawn since she gave  birth to her son 33 years ago.  ROS Pertinent items are noted in HPI.  Chemistry and CBC: Creatinine, Ser  Date/Time Value Ref Range Status  12/29/2018 04:17 AM 2.11 (H) 0.44 - 1.00 mg/dL Final  12/28/2018 02:06 AM 2.38 (H) 0.44 - 1.00 mg/dL Final  12/27/2018 11:08 AM 1.87 (H) 0.44 - 1.00 mg/dL Final   Recent Labs  Lab 12/27/18 1108 12/28/18 0206 12/29/18 0417  NA 138 137 137  K 4.7 4.7 4.3  CL 101 104 108  CO2 20* 18* 21*  GLUCOSE 133* 123* 111*  BUN 38* 49* 60*  CREATININE 1.87* 2.38* 2.11*  CALCIUM 10.1 9.2 8.8*   Recent Labs  Lab 12/27/18 1108 12/28/18 0206 12/29/18 0417  WBC 13.0* 16.8* 13.2*  NEUTROABS 11.2*  --   --   HGB 14.2 12.9 10.9*  HCT 44.6 40.4 33.5*  MCV 91.4 91.2 90.3  PLT 172 186 184   Liver Function Tests: Recent Labs  Lab 12/27/18 1108  AST 32  ALT 21  ALKPHOS 84  BILITOT 0.9  PROT 9.2*  ALBUMIN 3.6   Recent Labs  Lab 12/27/18 1108  LIPASE 34   No results for input(s): AMMONIA in the last 168 hours. Cardiac Enzymes: No results for input(s): CKTOTAL, CKMB, CKMBINDEX, TROPONINI in the last 168 hours. Iron Studies: No results for input(s): IRON, TIBC, TRANSFERRIN, FERRITIN in the last 72 hours. PT/INR: @LABRCNTIP (inr:5)  Xrays/Other Studies: ) Results for orders placed or performed during the hospital encounter of 12/27/18 (from the past 48 hour(s))  CBC  Status: Abnormal   Collection Time: 12/28/18  2:06 AM  Result Value Ref Range   WBC 16.8 (H) 4.0 - 10.5 K/uL   RBC 4.43 3.87 - 5.11 MIL/uL   Hemoglobin 12.9 12.0 - 15.0 g/dL   HCT 40.4 36.0 - 46.0 %   MCV 91.2 80.0 - 100.0 fL   MCH 29.1 26.0 - 34.0 pg   MCHC 31.9 30.0 - 36.0 g/dL   RDW 13.2 11.5 - 15.5 %   Platelets 186 150 - 400 K/uL   nRBC 0.0 0.0 - 0.2 %    Comment: Performed at [facility], [place] [address], [place], [place] [address]  Basic metabolic panel     Status: Abnormal   Collection Time: 12/28/18  2:06 AM  Result Value Ref Range    Sodium 137 135 - 145 mmol/L   Potassium 4.7 3.5 - 5.1 mmol/L   Chloride 104 98 - 111 mmol/L   CO2 18 (L) 22 - 32 mmol/L   Glucose, Bld 123 (H) 70 - 99 mg/dL   BUN 49 (H) 8 - 23 mg/dL   Creatinine, Ser 2.38 (H) 0.44 - 1.00 mg/dL   Calcium 9.2 8.9 - 10.3 mg/dL   GFR calc non Af Amer 20 (L) >60 mL/min   GFR calc Af Amer 23 (L) >60 mL/min   Anion gap 15 5 - 15    Comment: Performed at [place] [address] [place], [place] [address]  TSH     Status: None   Collection Time: 12/28/18  2:06 AM  Result Value Ref Range   TSH 1.162 0.350 - 4.500 uIU/mL    Comment: Performed by a 3rd Generation assay with a functional sensitivity of <=0.01 uIU/mL. Performed at [facility], [place] [address] [place], [place] [address]   CA 125     Status: None   Collection Time: 12/28/18  2:06 AM  Result Value Ref Range   Cancer Antigen (CA) 125 31.9 0.0 - 38.1 U/mL    Comment: (NOTE) Roche [employer]ostics Electrochemiluminescence Immunoassay (ECLIA) Values obtained with different assay methods or kits cannot be used interchangeably.  Results cannot be interpreted as absolute evidence of the presence or absence of malignant disease. Performed At: [facility] [place], [place] [id number] Rush Farmer MD AV:[id number]   Heparin level (unfractionated)     Status: Abnormal   Collection Time: 12/28/18  2:06 AM  Result Value Ref Range   Heparin Unfractionated 0.75 (H) 0.30 - 0.70 IU/mL    Comment: (NOTE) If heparin results are below expected values, and patient dosage has  been confirmed, suggest follow up testing of antithrombin III levels. Performed at [facility], [place] [address] [place], [place] [address]   Urinalysis, Routine w reflex microscopic     Status: Abnormal   Collection Time: 12/28/18  5:27 AM  Result Value Ref Range   Color, Urine AMBER (A) YELLOW    Comment: BIOCHEMICALS MAY BE AFFECTED BY COLOR    APPearance CLOUDY (A) CLEAR   Specific Gravity, Urine 1.019 1.005 - 1.030   pH 5.0 5.0 - 8.0   Glucose, UA NEGATIVE NEGATIVE mg/dL   Hgb urine dipstick MODERATE (A) NEGATIVE   Bilirubin Urine NEGATIVE NEGATIVE   Ketones, ur 5 (A) NEGATIVE mg/dL   Protein, ur 30 (A) NEGATIVE mg/dL   Nitrite NEGATIVE NEGATIVE   Leukocytes, UA TRACE (A) NEGATIVE   RBC / HPF 0-5 0 - 5 RBC/hpf  WBC, UA 0-5 0 - 5 WBC/hpf   Bacteria, UA MANY (A) NONE SEEN   Squamous Epithelial / LPF 0-5 0 - 5   Mucus PRESENT    Hyaline Casts, UA PRESENT     Comment: Performed at [facility], [place] [address], [place], [place] [address]  Heparin level (unfractionated)     Status: None   Collection Time: 12/28/18 11:01 AM  Result Value Ref Range   Heparin Unfractionated 0.67 0.30 - 0.70 IU/mL    Comment: (NOTE) If heparin results are below expected values, and patient dosage has  been confirmed, suggest follow up testing of antithrombin III levels. Performed at [facility], [place] [address] [place], [place] [address]   Lactate dehydrogenase (pleural or peritoneal fluid)     Status: Abnormal   Collection Time: 12/28/18  3:05 PM  Result Value Ref Range   LD, Fluid 710 (H) 3 - 23 U/L    Comment: (NOTE) Results should be evaluated in conjunction with serum values    Fluid Type-FLDH PERITONEAL     Comment: Performed at [facility], [place] [address], [place], [place] [address] CORRECTED ON 12/30 AT 1556: PREVIOUSLY REPORTED AS Peritoneal   Body fluid cell count with differential     Status: Abnormal   Collection Time: 12/28/18  3:05 PM  Result Value Ref Range   Fluid Type-FCT PERITONEAL     Comment: CORRECTED ON 12/30 AT 1556: PREVIOUSLY REPORTED AS Peritoneal   Color, Fluid YELLOW YELLOW   Appearance, Fluid CLEAR (A) CLEAR   WBC, Fluid 359 0 - 1,000 cu mm   Neutrophil Count, Fluid 4 0 - 25 %   Lymphs, Fluid 14 %   Monocyte-Macrophage-Serous Fluid 82 50 - 90 %    Other Cells, Fluid OTHER CELLS IDENTIFIED AS MESOTHELIAL CELLS %    Comment: CORRELATE WITH CYTOLOGY. Performed at [facility], [place] [address] [place], [place] [address]   Albumin, pleural or peritoneal fluid     Status: None   Collection Time: 12/28/18  3:05 PM  Result Value Ref Range   Albumin, Fluid 2.0 g/dL    Comment: (NOTE) No normal range established for this test Results should be evaluated in conjunction with serum values    Fluid Type-FALB PERITONEAL     Comment: Performed at [facility], [place] [address], [place], [place] [address] CORRECTED ON 12/30 AT 1556: PREVIOUSLY REPORTED AS Peritoneal   Culture, body fluid-bottle     Status: None (Preliminary result)   Collection Time: 12/28/18  3:05 PM  Result Value Ref Range   Specimen Description PERITONEAL    Special Requests NONE    Culture      NO GROWTH < 24 HOURS Performed at [facility], [place] [address] [place], [place] [address]    Report Status PENDING   Gram stain     Status: None   Collection Time: 12/28/18  3:05 PM  Result Value Ref Range   Specimen Description PERITONEAL    Special Requests NONE    Gram Stain      RARE WBC PRESENT, PREDOMINANTLY MONONUCLEAR NO ORGANISMS SEEN Performed at [facility], [place] [address], [place], [place] [address]    Report Status 12/29/2018 FINAL   Heparin level (unfractionated)     Status: Abnormal   Collection Time: 12/28/18  7:02 PM  Result Value Ref Range   Heparin Unfractionated 0.26 (L) 0.30 - 0.70 IU/mL  Comment: (NOTE) If heparin results are below expected values, and patient dosage has  been confirmed, suggest follow up testing of antithrombin III levels. Performed at [facility], [place] [address], [place], [place] [address]   CBC     Status: Abnormal   Collection Time: 12/29/18  4:17 AM  Result Value Ref Range   WBC 13.2 (H) 4.0 - 10.5 K/uL   RBC 3.71 (L) 3.87 - 5.11 MIL/uL    Hemoglobin 10.9 (L) 12.0 - 15.0 g/dL   HCT 33.5 (L) 36.0 - 46.0 %   MCV 90.3 80.0 - 100.0 fL   MCH 29.4 26.0 - 34.0 pg   MCHC 32.5 30.0 - 36.0 g/dL   RDW 13.3 11.5 - 15.5 %   Platelets 184 150 - 400 K/uL   nRBC 0.0 0.0 - 0.2 %    Comment: Performed at [facility], [place] [address] [place], [place] [address]  Basic metabolic panel     Status: Abnormal   Collection Time: 12/29/18  4:17 AM  Result Value Ref Range   Sodium 137 135 - 145 mmol/L   Potassium 4.3 3.5 - 5.1 mmol/L   Chloride 108 98 - 111 mmol/L   CO2 21 (L) 22 - 32 mmol/L   Glucose, Bld 111 (H) 70 - 99 mg/dL   BUN 60 (H) 8 - 23 mg/dL   Creatinine, Ser 2.11 (H) 0.44 - 1.00 mg/dL   Calcium 8.8 (L) 8.9 - 10.3 mg/dL   GFR calc non Af Amer 23 (L) >60 mL/min   GFR calc Af Amer 26 (L) >60 mL/min   Anion gap 8 5 - 15    Comment: Performed at [facility], [place] [address] [place], [place] [address]  Heparin level (unfractionated)     Status: None   Collection Time: 12/29/18  4:17 AM  Result Value Ref Range   Heparin Unfractionated 0.69 0.30 - 0.70 IU/mL    Comment: (NOTE) If heparin results are below expected values, and patient dosage has  been confirmed, suggest follow up testing of antithrombin III levels. Performed at [facility], [place] [address] [place], [place] [address]   Heparin level (unfractionated)     Status: None   Collection Time: 12/29/18  1:15 PM  Result Value Ref Range   Heparin Unfractionated 0.54 0.30 - 0.70 IU/mL    Comment: (NOTE) If heparin results are below expected values, and patient dosage has  been confirmed, suggest follow up testing of antithrombin III levels. Performed at [facility], [place] [address], [place], [place] [address]    US Paracentesis  Result Date: 12/28/2018 INDICATION: Patient with history of lower extremity DVT, abdomino-pelvic mass, renal insufficiency, ascites ; request made for diagnostic and  therapeutic paracentesis up to 5 liters. EXAM: ULTRASOUND GUIDED DIAGNOSTIC AND THERAPEUTIC PARACENTESIS MEDICATIONS: None COMPLICATIONS: None immediate. PROCEDURE: Informed written consent was obtained from the patient after a discussion of the risks, benefits and alternatives to treatment. A timeout was performed prior to the initiation of the procedure. Initial ultrasound scanning demonstrates a large amount of ascites within the left mid to lower abdominal quadrant. The left mid to lower abdomen was prepped and draped in the usual sterile fashion. 1% lidocaine was used for local anesthesia. Following this, a 19 gauge, 7-cm, Yueh catheter was introduced. An ultrasound image was saved for documentation purposes. The paracentesis was performed. The catheter was removed and a dressing was applied. The patient tolerated the procedure well  without immediate post procedural complication. FINDINGS: A total of approximately 5 liters of slightly hazy, yellow fluid was removed. Samples were sent to the laboratory as requested by the clinical team. IMPRESSION: Successful ultrasound-guided diagnostic and therapeutic paracentesis yielding 5 liters of peritoneal fluid. Read by: Rowe Robert, PA-C Electronically Signed   By: Sandi Mariscal M.D.   On: 12/28/2018 15:45    PMH:  History reviewed. No pertinent past medical history.  PSH:   Past Surgical History:  Procedure Laterality Date  . CESAREAN SECTION    . Cyst removal on Left Wrist Left   . Surgery on Right Wrist Right 2006    Allergies:  Allergies  Allergen Reactions  . Penicillins Rash    DID THE REACTION INVOLVE: Swelling of the face/tongue/throat, SOB, or low BP? No Sudden or severe rash/hives, skin peeling, or the inside of the mouth or nose? No Did it require medical treatment? No When did it last happen?childhood allergy If all above answers are "NO", may proceed with cephalosporin use.     Medications:   Prior to Admission medications    Medication Sig Start Date End Date Taking? Authorizing Provider  bismuth subsalicylate (PEPTO BISMOL) 262 MG/15ML suspension Take 30 mLs by mouth every 4 (four) hours as needed for indigestion.   Yes [provider]    Discontinued Meds:   Medications Discontinued During This Encounter  Medication Reason  . heparin ADULT infusion 100 units/mL (25000 units/211mL sodium chloride 0.45%)   . heparin ADULT infusion 100 units/mL (25000 units/224mL sodium chloride 0.45%)     Social History:  reports that she has quit smoking. Her smoking use included cigarettes. She smoked 1.00 pack per day. She has never used smokeless tobacco. She reports that she does not drink alcohol or use drugs.  Family History:  History reviewed. No pertinent family history.  Blood pressure 122/63, pulse 74, temperature 98.6 F (37 C), temperature source Oral, resp. rate 16, height 5\' 7"  (1.702 m), weight 79.4 kg, SpO2 99 %. General appearance: alert, cooperative and appears stated age Head: Normocephalic, without obvious abnormality, atraumatic Eyes: negative Neck: no adenopathy, no carotid bruit, supple, symmetrical, trachea midline and thyroid not enlarged, symmetric, no tenderness/mass/nodules Back: symmetric, no curvature. ROM normal. No CVA tenderness. Resp: clear to auscultation bilaterally Chest wall: no tenderness Cardio: regular rate and rhythm, S1, S2 normal, no murmur, click, rub or gallop GI: Massive ascites, firm esp on right side of median Extremities: edema left leg edematouos, no edema on right Pulses: 2+ and symmetric Skin: Skin color, texture, turgor normal. No rashes or lesions Lymph nodes: no cerv LAD Neurologic: Grossly normal       Octavie Westerhold, Hunt Oris, MD 12/29/2018, 3:29 PM

## 2018-12-29 NOTE — Consult Note (Signed)
Reason for Consult: Newly diagnosed abdominal/pelvic mass Referring Physician: Lennice Sites, DO  Haley Palmer is an 72 y.o. female.  HPI:  She presented to the ED with increasing abdominal girth and LLE swelling for several months. She has associated DOE and decreased appetite. She endorses difficulty with AOD. She denies N/V, early satiety, bloating, weight loss, C/P.   Labs are suggestive of renal insufficiency--possible AKI/prerenal.  A CT scan showed a large abdominal/pelvic mass, 29 cm--felt to originate possibly from the right adnexa with large volume ascites, no lymphadenopathy.  Multiple pulmonary nodules were noted and were not felt to represent metastatic disease.  Duplex doppler study of the LE revealed a LLE DVT.  There is a family h/o colon and breast malignancies.  Interval Hx 12/28/18 - states feels much better post-paracentesis. Was able to eat more tonight for dinner. Has had no appetite lately. Has been eating small amounts only to avoid fullness. Intermittent episodes of emesis but with small meals she was able to control.  Has had no GYN care since birth of her last child. Denies bleeding.  History reviewed. No pertinent past medical history.  Past Surgical History:  Procedure Laterality Date  . CESAREAN SECTION    . Cyst removal on Left Wrist Left   . Surgery on Right Wrist Right 2006    History reviewed. No pertinent family history.  Social History:  reports that she has quit smoking. Her smoking use included cigarettes. She smoked 1.00 pack per day. She has never used smokeless tobacco. She reports that she does not drink alcohol or use drugs.  Allergies:  Allergies  Allergen Reactions  . Penicillins Rash    DID THE REACTION INVOLVE: Swelling of the face/tongue/throat, SOB, or low BP? No Sudden or severe rash/hives, skin peeling, or the inside of the mouth or nose? No Did it require medical treatment? No When did it last happen?childhood allergy If  all above answers are "NO", may proceed with cephalosporin use.      Ct Abdomen Pelvis Wo Contrast  Result Date: 12/27/2018 CLINICAL DATA:  72 year old female with history of abdominal distension. Evaluate for potential mass. EXAM: CT CHEST, ABDOMEN AND PELVIS WITHOUT CONTRAST TECHNIQUE: Multidetector CT imaging of the chest, abdomen and pelvis was performed following the standard protocol without IV contrast. COMPARISON:  None. FINDINGS: CT CHEST FINDINGS Cardiovascular: Heart size is normal. There is no significant pericardial fluid, thickening or pericardial calcification. Atherosclerotic calcifications in the left anterior descending coronary artery. Mediastinum/Nodes: No pathologically enlarged mediastinal or hilar lymph nodes. Moderate-sized hiatal hernia. No axillary lymphadenopathy. Lungs/Pleura: Mild diffuse bronchial wall thickening with mild centrilobular and paraseptal emphysema. Innumerable tiny 1-3 mm pulmonary nodules scattered throughout the periphery of the lungs bilaterally, nonspecific. No larger more suspicious appearing pulmonary nodules or masses are noted. No acute consolidative airspace disease. No pleural effusions. Areas of linear scarring noted in the right middle lobe, inferior segment of the lingula and anterior aspect of the left lower lobe. Musculoskeletal: There are no aggressive appearing lytic or blastic lesions noted in the visualized portions of the skeleton. CT ABDOMEN PELVIS FINDINGS Hepatobiliary: No definite cystic or solid hepatic lesions are confidently identified on today's noncontrast CT examination. Gallbladder is not confidently identified may be surgically absent, or may be obscured by adjacent ascites. Pancreas: No definite pancreatic mass noted on today's noncontrast CT examination. No definite surrounding peripancreatic inflammatory changes. Spleen: Unremarkable. Adrenals/Urinary Tract: Unenhanced appearance of the kidneys and bilateral adrenal glands is  normal. No hydroureteronephrosis. Urinary bladder is  poorly demonstrated, but unremarkable in appearance. Stomach/Bowel: Normal appearance of the stomach. No pathologic dilatation of small bowel or colon. Appendix is not confidently identified. Vascular/Lymphatic: Aortic atherosclerosis. No lymphadenopathy identified in the abdomen or pelvis on today's noncontrast CT examination. Reproductive: Unenhanced appearance of the uterus is unremarkable. In the central abdomen and pelvis there is a very large centrally low-attenuation rim enhancing mass (axial image 85 of series 2 and sagittal image 92 of series 7) measuring 28.7 x 25.6 x 25.6 cm. This appears potentially related to the right adnexal region, although the origin of this lesion is uncertain. Left ovary is not confidently identified. Other: Large volume of ascites.  No pneumoperitoneum. Musculoskeletal: There are no aggressive appearing lytic or blastic lesions noted in the visualized portions of the skeleton. IMPRESSION: 1. 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 This is associated with a very large volume of (presumably malignant) ascites. 2. Multiple tiny 1-3 mm pulmonary nodules scattered throughout the lungs bilaterally. These are nonspecific, but favored to represent benign areas of mucoid impaction within terminal bronchioles. However, close attention on follow-up studies is recommended to ensure the stability or resolution of this finding, as the possibility of metastatic disease is not excluded. 3. Aortic atherosclerosis, in addition to left anterior descending coronary artery disease. Assessment for potential risk factor modification, dietary therapy or pharmacologic therapy may be warranted, if clinically indicated. Electronically Signed   By: Vinnie Langton M.D.   On: 12/27/2018 13:41   Ct Chest Wo Contrast  Result Date: 12/27/2018 CLINICAL  DATA:  72 year old female with history of abdominal distension. Evaluate for potential mass. EXAM: CT CHEST, ABDOMEN AND PELVIS WITHOUT CONTRAST TECHNIQUE: Multidetector CT imaging of the chest, abdomen and pelvis was performed following the standard protocol without IV contrast. COMPARISON:  None. FINDINGS: CT CHEST FINDINGS Cardiovascular: Heart size is normal. There is no significant pericardial fluid, thickening or pericardial calcification. Atherosclerotic calcifications in the left anterior descending coronary artery. Mediastinum/Nodes: No pathologically enlarged mediastinal or hilar lymph nodes. Moderate-sized hiatal hernia. No axillary lymphadenopathy. Lungs/Pleura: Mild diffuse bronchial wall thickening with mild centrilobular and paraseptal emphysema. Innumerable tiny 1-3 mm pulmonary nodules scattered throughout the periphery of the lungs bilaterally, nonspecific. No larger more suspicious appearing pulmonary nodules or masses are noted. No acute consolidative airspace disease. No pleural effusions. Areas of linear scarring noted in the right middle lobe, inferior segment of the lingula and anterior aspect of the left lower lobe. Musculoskeletal: There are no aggressive appearing lytic or blastic lesions noted in the visualized portions of the skeleton. CT ABDOMEN PELVIS FINDINGS Hepatobiliary: No definite cystic or solid hepatic lesions are confidently identified on today's noncontrast CT examination. Gallbladder is not confidently identified may be surgically absent, or may be obscured by adjacent ascites. Pancreas: No definite pancreatic mass noted on today's noncontrast CT examination. No definite surrounding peripancreatic inflammatory changes. Spleen: Unremarkable. Adrenals/Urinary Tract: Unenhanced appearance of the kidneys and bilateral adrenal glands is normal. No hydroureteronephrosis. Urinary bladder is poorly demonstrated, but unremarkable in appearance. Stomach/Bowel: Normal appearance of the  stomach. No pathologic dilatation of small bowel or colon. Appendix is not confidently identified. Vascular/Lymphatic: Aortic atherosclerosis. No lymphadenopathy identified in the abdomen or pelvis on today's noncontrast CT examination. Reproductive: Unenhanced appearance of the uterus is unremarkable. In the central abdomen and pelvis there is a very large centrally low-attenuation rim enhancing mass (axial image 85 of series 2 and  sagittal image 92 of series 7) measuring 28.7 x 25.6 x 25.6 cm. This appears potentially related to the right adnexal region, although the origin of this lesion is uncertain. Left ovary is not confidently identified. Other: Large volume of ascites.  No pneumoperitoneum. Musculoskeletal: There are no aggressive appearing lytic or blastic lesions noted in the visualized portions of the skeleton. IMPRESSION: 1. 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 This is associated with a very large volume of (presumably malignant) ascites. 2. Multiple tiny 1-3 mm pulmonary nodules scattered throughout the lungs bilaterally. These are nonspecific, but favored to represent benign areas of mucoid impaction within terminal bronchioles. However, close attention on follow-up studies is recommended to ensure the stability or resolution of this finding, as the possibility of metastatic disease is not excluded. 3. Aortic atherosclerosis, in addition to left anterior descending coronary artery disease. Assessment for potential risk factor modification, dietary therapy or pharmacologic therapy may be warranted, if clinically indicated. Electronically Signed   By: Vinnie Langton M.D.   On: 12/27/2018 13:41   US Paracentesis  Result Date: 12/28/2018 INDICATION: Patient with history of lower extremity DVT, abdomino-pelvic mass, renal insufficiency, ascites ; request made for diagnostic and therapeutic  paracentesis up to 5 liters. EXAM: ULTRASOUND GUIDED DIAGNOSTIC AND THERAPEUTIC PARACENTESIS MEDICATIONS: None COMPLICATIONS: None immediate. PROCEDURE: Informed written consent was obtained from the patient after a discussion of the risks, benefits and alternatives to treatment. A timeout was performed prior to the initiation of the procedure. Initial ultrasound scanning demonstrates a large amount of ascites within the left mid to lower abdominal quadrant. The left mid to lower abdomen was prepped and draped in the usual sterile fashion. 1% lidocaine was used for local anesthesia. Following this, a 19 gauge, 7-cm, Yueh catheter was introduced. An ultrasound image was saved for documentation purposes. The paracentesis was performed. The catheter was removed and a dressing was applied. The patient tolerated the procedure well without immediate post procedural complication. FINDINGS: A total of approximately 5 liters of slightly hazy, yellow fluid was removed. Samples were sent to the laboratory as requested by the clinical team. IMPRESSION: Successful ultrasound-guided diagnostic and therapeutic paracentesis yielding 5 liters of peritoneal fluid. Read by: Rowe Robert, PA-C Electronically Signed   By: Sandi Mariscal M.D.   On: 12/28/2018 15:45   Dg Chest Portable 1 View  Result Date: 12/27/2018 CLINICAL DATA:  Abdominal distension and leg swelling EXAM: PORTABLE CHEST 1 VIEW COMPARISON:  None. FINDINGS: Monitoring leads overlie the patient. Normal cardiac and mediastinal contours. No consolidative pulmonary opacities. No pleural effusion or pneumothorax. Thoracic spine degenerative changes. IMPRESSION: No active disease. Electronically Signed   By: Lovey Newcomer M.D.   On: 12/27/2018 11:48   Vas [place] Lower Extremity Venous (dvt) (only [facility])  Result Date: 12/27/2018  Lower Venous Study Indications: Pain, Swelling, and Edema.  Limitations: Body habitus and tightenedness due to edema. Performing Technologist:  Lorina Rabon  Examination Guidelines: A complete evaluation includes B-mode imaging, spectral Doppler, color Doppler, and power Doppler as needed of all accessible portions of each vessel. Bilateral testing is considered an integral part of a complete examination. Limited examinations for reoccurring indications may be performed as noted.  Right Venous Findings: +---+---------------+---------+-----------+----------+-------+    CompressibilityPhasicitySpontaneityPropertiesSummary +---+---------------+---------+-----------+----------+-------+ CFVFull           Yes      Yes                          +---+---------------+---------+-----------+----------+-------+  Left Venous Findings: +---------+---------------+---------+-----------+----------+-----------------+          CompressibilityPhasicitySpontaneityPropertiesSummary           +---------+---------------+---------+-----------+----------+-----------------+ CFV      None           No       No                   Acute             +---------+---------------+---------+-----------+----------+-----------------+ SFJ      None                                         Acute             +---------+---------------+---------+-----------+----------+-----------------+ FV Prox  None                                         Acute             +---------+---------------+---------+-----------+----------+-----------------+ FV Mid   None                                         Acute             +---------+---------------+---------+-----------+----------+-----------------+ FV DistalNone                                         Acute             +---------+---------------+---------+-----------+----------+-----------------+ PFV      None                                         Age Indeterminate +---------+---------------+---------+-----------+----------+-----------------+ POP      None                                          Age Indeterminate +---------+---------------+---------+-----------+----------+-----------------+ PTV      None                                         Age Indeterminate +---------+---------------+---------+-----------+----------+-----------------+ PERO     None                                         Age Indeterminate +---------+---------------+---------+-----------+----------+-----------------+ GSV      None                                         Acute             +---------+---------------+---------+-----------+----------+-----------------+    Summary: Right: No evidence of common femoral vein obstruction. Left: Findings consistent with acute deep vein thrombosis involving the left  common femoral vein, and left femoral vein. Findings consistent with acute superficial vein thrombosis involving the left great saphenous vein. Findings consistent with age indeterminate deep vein thrombosis involving the left proximal profunda vein, left popliteal vein, left posterior tibial vein, and left peroneal vein. Notice: Left external iliac vein also thrombosed . IVC cannot well visualized. No cystic structure found in the popliteal fossa.  *See table(s) above for measurements and observations.    Preliminary     Review of Systems  Constitutional: Positive for malaise/fatigue. Negative for weight loss.  Respiratory: Positive for shortness of breath. Negative for cough and hemoptysis.   Cardiovascular: Negative for chest pain.  Gastrointestinal: Positive for abdominal pain. Negative for constipation, nausea and vomiting.  Musculoskeletal:       LLE swelling   Blood pressure 127/69, pulse 80, temperature 98.3 F (36.8 C), temperature source Oral, resp. rate 14, height 5\' 7"  (1.702 m), weight 175 lb (79.4 kg), SpO2 96 %. Physical Exam  Constitutional: She is oriented to person, place, and time. No distress.  Thin  HENT:  Head: Normocephalic and atraumatic.  Neck: Neck supple.   Cardiovascular: Regular rhythm.  Respiratory: Breath sounds normal.  GI: There is no abdominal tenderness.  Marked distension  Musculoskeletal:     Comments: Marked LLE edema pedal--thigh  Lymphadenopathy:       Right: No inguinal adenopathy present.       Left: No inguinal adenopathy present.  Neurological: She is alert and oriented to person, place, and time.    Assessment/Plan: Pelvic mass - CA125 minimally elevated. Await cytology. Will ultimately need laparotomy to resect and we'll need medical clearance prior Explore benefit of IVC filter with pulmonary?  AKI - per primary team It would be ideal to see improvement in her renal function during this admission  ADL - She is cachectic appearing and I suspect malnourished. She would likely do poorly with any large upfront debulking, however imaging does not show obvious extraovarian disease. Will await cytology  DVT - per primary team Consider IVC filter if we plan surgery in the near future; request medicine or pulmonary to weigh in.  (Patient seen ~7pm 12/28/18) Haley Palmer 12/29/2018, 6:17 AM

## 2018-12-29 NOTE — Progress Notes (Signed)
Referring Physician(s): Nettey,S  Supervising Physician: Markus Daft  Patient Status:  [facility] - In-pt  Chief Complaint:  Abdominal distention, metastatic adenocarcinoma, left leg DVT  Subjective: Patient familiar to IR service from 5 liter paracentesis yesterday revealing adenocarcinoma.  She was recently admitted on 12/29 with abdominal distention, left lower extremity swelling, dyspnea and diminished appetite.  Subsequently found to have large central abdominal /pelvic mass of probable ovarian origin with associated ascites.  She has also been diagnosed with acute left lower extremity DVT and is on IV heparin at this time.  Cytoreductive surgery tentatively planned and request now received for IVC filter placement.  Current labs include WBC 13.2, hemoglobin 10.9, platelets 184k, creatinine 2.11.  Currently denies fever, headache, chest pain, nausea, vomiting or bleeding.  She does complain of some left knee cap discomfort.  History reviewed. No pertinent past medical history. Past Surgical History:  Procedure Laterality Date  . CESAREAN SECTION    . Cyst removal on Left Wrist Left   . Surgery on Right Wrist Right 2006      Allergies: Penicillins  Medications: Prior to Admission medications   Medication Sig Start Date End Date Taking? Authorizing Provider  bismuth subsalicylate (PEPTO BISMOL) 262 MG/15ML suspension Take 30 mLs by mouth every 4 (four) hours as needed for indigestion.   Yes [provider]     Vital Signs: BP 122/63 (BP Location: Right Arm)   Pulse 74   Temp 98.6 F (37 C) (Oral)   Resp 16   Ht 5\' 7"  (1.702 m)   Wt 175 lb (79.4 kg)   SpO2 99%   BMI 27.41 kg/m   Physical Exam awake, alert.  Chest clear to auscultation bilaterally anteriorly.  Heart with regular rate and rhythm.  Abdomen markedly distended, +BS, not sig tender; significant left lower extremity edema, no significant right lower extremity edema.  Imaging: Ct Abdomen Pelvis Wo  Contrast  Result Date: 12/27/2018 CLINICAL DATA:  72 year old female with history of abdominal distension. Evaluate for potential mass. EXAM: CT CHEST, ABDOMEN AND PELVIS WITHOUT CONTRAST TECHNIQUE: Multidetector CT imaging of the chest, abdomen and pelvis was performed following the standard protocol without IV contrast. COMPARISON:  None. FINDINGS: CT CHEST FINDINGS Cardiovascular: Heart size is normal. There is no significant pericardial fluid, thickening or pericardial calcification. Atherosclerotic calcifications in the left anterior descending coronary artery. Mediastinum/Nodes: No pathologically enlarged mediastinal or hilar lymph nodes. Moderate-sized hiatal hernia. No axillary lymphadenopathy. Lungs/Pleura: Mild diffuse bronchial wall thickening with mild centrilobular and paraseptal emphysema. Innumerable tiny 1-3 mm pulmonary nodules scattered throughout the periphery of the lungs bilaterally, nonspecific. No larger more suspicious appearing pulmonary nodules or masses are noted. No acute consolidative airspace disease. No pleural effusions. Areas of linear scarring noted in the right middle lobe, inferior segment of the lingula and anterior aspect of the left lower lobe. Musculoskeletal: There are no aggressive appearing lytic or blastic lesions noted in the visualized portions of the skeleton. CT ABDOMEN PELVIS FINDINGS Hepatobiliary: No definite cystic or solid hepatic lesions are confidently identified on today's noncontrast CT examination. Gallbladder is not confidently identified may be surgically absent, or may be obscured by adjacent ascites. Pancreas: No definite pancreatic mass noted on today's noncontrast CT examination. No definite surrounding peripancreatic inflammatory changes. Spleen: Unremarkable. Adrenals/Urinary Tract: Unenhanced appearance of the kidneys and bilateral adrenal glands is normal. No hydroureteronephrosis. Urinary bladder is poorly demonstrated, but unremarkable in  appearance. Stomach/Bowel: Normal appearance of the stomach. No pathologic dilatation of small bowel  or colon. Appendix is not confidently identified. Vascular/Lymphatic: Aortic atherosclerosis. No lymphadenopathy identified in the abdomen or pelvis on today's noncontrast CT examination. Reproductive: Unenhanced appearance of the uterus is unremarkable. In the central abdomen and pelvis there is a very large centrally low-attenuation rim enhancing mass (axial image 85 of series 2 and sagittal image 92 of series 7) measuring 28.7 x 25.6 x 25.6 cm. This appears potentially related to the right adnexal region, although the origin of this lesion is uncertain. Left ovary is not confidently identified. Other: Large volume of ascites.  No pneumoperitoneum. Musculoskeletal: There are no aggressive appearing lytic or blastic lesions noted in the visualized portions of the skeleton. IMPRESSION: 1. 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 This is associated with a very large volume of (presumably malignant) ascites. 2. Multiple tiny 1-3 mm pulmonary nodules scattered throughout the lungs bilaterally. These are nonspecific, but favored to represent benign areas of mucoid impaction within terminal bronchioles. However, close attention on follow-up studies is recommended to ensure the stability or resolution of this finding, as the possibility of metastatic disease is not excluded. 3. Aortic atherosclerosis, in addition to left anterior descending coronary artery disease. Assessment for potential risk factor modification, dietary therapy or pharmacologic therapy may be warranted, if clinically indicated. Electronically Signed   By: Vinnie Langton M.D.   On: 12/27/2018 13:41   Ct Chest Wo Contrast  Result Date: 12/27/2018 CLINICAL DATA:  72 year old female with history of abdominal distension. Evaluate for potential mass.  EXAM: CT CHEST, ABDOMEN AND PELVIS WITHOUT CONTRAST TECHNIQUE: Multidetector CT imaging of the chest, abdomen and pelvis was performed following the standard protocol without IV contrast. COMPARISON:  None. FINDINGS: CT CHEST FINDINGS Cardiovascular: Heart size is normal. There is no significant pericardial fluid, thickening or pericardial calcification. Atherosclerotic calcifications in the left anterior descending coronary artery. Mediastinum/Nodes: No pathologically enlarged mediastinal or hilar lymph nodes. Moderate-sized hiatal hernia. No axillary lymphadenopathy. Lungs/Pleura: Mild diffuse bronchial wall thickening with mild centrilobular and paraseptal emphysema. Innumerable tiny 1-3 mm pulmonary nodules scattered throughout the periphery of the lungs bilaterally, nonspecific. No larger more suspicious appearing pulmonary nodules or masses are noted. No acute consolidative airspace disease. No pleural effusions. Areas of linear scarring noted in the right middle lobe, inferior segment of the lingula and anterior aspect of the left lower lobe. Musculoskeletal: There are no aggressive appearing lytic or blastic lesions noted in the visualized portions of the skeleton. CT ABDOMEN PELVIS FINDINGS Hepatobiliary: No definite cystic or solid hepatic lesions are confidently identified on today's noncontrast CT examination. Gallbladder is not confidently identified may be surgically absent, or may be obscured by adjacent ascites. Pancreas: No definite pancreatic mass noted on today's noncontrast CT examination. No definite surrounding peripancreatic inflammatory changes. Spleen: Unremarkable. Adrenals/Urinary Tract: Unenhanced appearance of the kidneys and bilateral adrenal glands is normal. No hydroureteronephrosis. Urinary bladder is poorly demonstrated, but unremarkable in appearance. Stomach/Bowel: Normal appearance of the stomach. No pathologic dilatation of small bowel or colon. Appendix is not confidently  identified. Vascular/Lymphatic: Aortic atherosclerosis. No lymphadenopathy identified in the abdomen or pelvis on today's noncontrast CT examination. Reproductive: Unenhanced appearance of the uterus is unremarkable. In the central abdomen and pelvis there is a very large centrally low-attenuation rim enhancing mass (axial image 85 of series 2 and sagittal image 92 of series 7) measuring 28.7 x 25.6 x 25.6 cm. This appears potentially related to  the right adnexal region, although the origin of this lesion is uncertain. Left ovary is not confidently identified. Other: Large volume of ascites.  No pneumoperitoneum. Musculoskeletal: There are no aggressive appearing lytic or blastic lesions noted in the visualized portions of the skeleton. IMPRESSION: 1. 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 This is associated with a very large volume of (presumably malignant) ascites. 2. Multiple tiny 1-3 mm pulmonary nodules scattered throughout the lungs bilaterally. These are nonspecific, but favored to represent benign areas of mucoid impaction within terminal bronchioles. However, close attention on follow-up studies is recommended to ensure the stability or resolution of this finding, as the possibility of metastatic disease is not excluded. 3. Aortic atherosclerosis, in addition to left anterior descending coronary artery disease. Assessment for potential risk factor modification, dietary therapy or pharmacologic therapy may be warranted, if clinically indicated. Electronically Signed   By: Vinnie Langton M.D.   On: 12/27/2018 13:41   US Paracentesis  Result Date: 12/28/2018 INDICATION: Patient with history of lower extremity DVT, abdomino-pelvic mass, renal insufficiency, ascites ; request made for diagnostic and therapeutic paracentesis up to 5 liters. EXAM: ULTRASOUND GUIDED DIAGNOSTIC AND THERAPEUTIC PARACENTESIS  MEDICATIONS: None COMPLICATIONS: None immediate. PROCEDURE: Informed written consent was obtained from the patient after a discussion of the risks, benefits and alternatives to treatment. A timeout was performed prior to the initiation of the procedure. Initial ultrasound scanning demonstrates a large amount of ascites within the left mid to lower abdominal quadrant. The left mid to lower abdomen was prepped and draped in the usual sterile fashion. 1% lidocaine was used for local anesthesia. Following this, a 19 gauge, 7-cm, Yueh catheter was introduced. An ultrasound image was saved for documentation purposes. The paracentesis was performed. The catheter was removed and a dressing was applied. The patient tolerated the procedure well without immediate post procedural complication. FINDINGS: A total of approximately 5 liters of slightly hazy, yellow fluid was removed. Samples were sent to the laboratory as requested by the clinical team. IMPRESSION: Successful ultrasound-guided diagnostic and therapeutic paracentesis yielding 5 liters of peritoneal fluid. Read by: Rowe Robert, PA-C Electronically Signed   By: Sandi Mariscal M.D.   On: 12/28/2018 15:45   Dg Chest Portable 1 View  Result Date: 12/27/2018 CLINICAL DATA:  Abdominal distension and leg swelling EXAM: PORTABLE CHEST 1 VIEW COMPARISON:  None. FINDINGS: Monitoring leads overlie the patient. Normal cardiac and mediastinal contours. No consolidative pulmonary opacities. No pleural effusion or pneumothorax. Thoracic spine degenerative changes. IMPRESSION: No active disease. Electronically Signed   By: Lovey Newcomer M.D.   On: 12/27/2018 11:48   Vas [place] Lower Extremity Venous (dvt) (only [facility])  Result Date: 12/29/2018  Lower Venous Study Indications: Pain, Swelling, and Edema.  Limitations: Body habitus and tightenedness due to edema. Comparison Study: No prior study available. Performing Technologist: Lorina Rabon  Examination Guidelines: A complete  evaluation includes B-mode imaging, spectral Doppler, color Doppler, and power Doppler as needed of all accessible portions of each vessel. Bilateral testing is considered an integral part of a complete examination. Limited examinations for reoccurring indications may be performed as noted.  Right Venous Findings: +---+---------------+---------+-----------+----------+-------+    CompressibilityPhasicitySpontaneityPropertiesSummary +---+---------------+---------+-----------+----------+-------+ CFVFull           Yes      Yes                          +---+---------------+---------+-----------+----------+-------+  Left Venous Findings: +---------+---------------+---------+-----------+----------+-----------------+          CompressibilityPhasicitySpontaneityPropertiesSummary           +---------+---------------+---------+-----------+----------+-----------------+ CFV      None           No       No                   Acute             +---------+---------------+---------+-----------+----------+-----------------+ SFJ      None                                         Acute             +---------+---------------+---------+-----------+----------+-----------------+ FV Prox  None                                         Acute             +---------+---------------+---------+-----------+----------+-----------------+ FV Mid   None                                         Acute             +---------+---------------+---------+-----------+----------+-----------------+ FV DistalNone                                         Acute             +---------+---------------+---------+-----------+----------+-----------------+ PFV      None                                         Age Indeterminate +---------+---------------+---------+-----------+----------+-----------------+ POP      None                                         Age Indeterminate  +---------+---------------+---------+-----------+----------+-----------------+ PTV      None                                         Age Indeterminate +---------+---------------+---------+-----------+----------+-----------------+ PERO     None                                         Age Indeterminate +---------+---------------+---------+-----------+----------+-----------------+ GSV      None                                         Acute             +---------+---------------+---------+-----------+----------+-----------------+ EIV      None           No  Acute             +---------+---------------+---------+-----------+----------+-----------------+  Summary: Right: No evidence of common femoral vein obstruction. Left: Findings consistent with acute deep vein thrombosis involving the left common femoral vein, and left femoral vein. Findings consistent with acute superficial vein thrombosis involving the left great saphenous vein. Findings consistent with age indeterminate deep vein thrombosis involving the left proximal profunda vein, left popliteal vein, left posterior tibial vein, and left peroneal vein. Left external iliac vein appears thrombosied. IVC was not viualized. No cystic structure found in the popliteal fossa.  *See table(s) above for measurements and observations. Electronically signed by Deitra Mayo MD on 12/29/2018 at 6:38:59 AM.    Final     Labs:  CBC: Recent Labs    12/27/18 1108 12/28/18 0206 12/29/18 0417  WBC 13.0* 16.8* 13.2*  HGB 14.2 12.9 10.9*  HCT 44.6 40.4 33.5*  PLT 172 186 184    COAGS: Recent Labs    12/27/18 1108  INR 1.22    BMP: Recent Labs    12/27/18 1108 12/28/18 0206 12/29/18 0417  NA 138 137 137  K 4.7 4.7 4.3  CL 101 104 108  CO2 20* 18* 21*  GLUCOSE 133* 123* 111*  BUN 38* 49* 60*  CALCIUM 10.1 9.2 8.8*  CREATININE 1.87* 2.38* 2.11*  GFRNONAA 26* 20* 23*  GFRAA 30* 23* 26*     LIVER FUNCTION TESTS: Recent Labs    12/27/18 1108  BILITOT 0.9  AST 32  ALT 21  ALKPHOS 84  PROT 9.2*  ALBUMIN 3.6    Assessment and Plan: Patient with history of abdominal distention, left lower extremity swelling and subsequent finding of large abdominal /pelvic mass with associated ascites; recent lower extremity venous Doppler reveals acute left lower extremity DVT and cytology from paracentesis reveals non-small cell carcinoma, favoring adenocarcinoma,?ovarian origin.  Patient currently on IV heparin.  Plans noted for possible cytoreductive abdominal surgery pend pulm/medical clearance . Request received for IVC filter placement in interim.  Case has been reviewed by Dr. Vernard Gambles.Risks and benefits discussed with the patient including, but not limited to bleeding, infection, contrast induced renal failure, filter fracture or migration which can lead to emergency surgery or even death, strut penetration with damage or irritation to adjacent structures and caval thrombosis. Due to renal insufficiency CO2 should be utilized for venacavagram.  All of the patient's questions were answered, patient is agreeable to proceed. Consent signed and in chart.  Will tent plan case for 1/2; await f/u notes from gyn-onc   Electronically Signed: D. Rowe Robert, PA-C 12/29/2018, 4:46 PM   I spent a total of 25 minutes at the the patient's bedside AND on the patient's [facility] floor or unit, greater than 50% of which was counseling/coordinating care for IVC filter placement    Patient ID: Haley Palmer, female   DOB: Aug 07, 1946, 72 y.o.   MRN: [id number]

## 2018-12-29 NOTE — Evaluation (Signed)
Occupational Therapy Evaluation Patient Details Name: Haley Palmer MRN: [id number] DOB: 09-03-1946 Today's Date: 12/29/2018    History of Present Illness 72 year old female was admitted for abdominal and LLE swelling and pain.  Found to have LLE DVT and ovarian mass   Clinical Impression   Pt was admitted for the above. At baseline, she lives alone and is independent with adls/iadls.  Pt was limited by ascites and IVs in LUE. This was pt's first time OOB since admission; +2 needed for bed mobility and for safety with transfer.   Will follow in acute setting with supervision to min A level goals.    Follow Up Recommendations  SNF    Equipment Recommendations  3 in 1 bedside commode    Recommendations for Other Services       Precautions / Restrictions Precautions Precautions: Fall Precaution Comments: distented abdomen; DVT LLE Restrictions Weight Bearing Restrictions: No      Mobility Bed Mobility Overal bed mobility: Needs Assistance Bed Mobility: Rolling;Sidelying to Sit Rolling: Min assist Sidelying to sit: Mod assist;+2 for physical assistance       General bed mobility comments: cues for sequence; light assistance for legs; more assist for trunk  Transfers Overall transfer level: Needs assistance Equipment used: Rolling walker (2 wheeled) Transfers: Sit to/from [facility] Sit to Stand: Min assist;+2 safety/equipment;+2 physical assistance;From elevated surface Stand pivot transfers: Min assist;+2 physical assistance;+2 safety/equipment       General transfer comment: cues for UE placement    Balance                                           ADL either performed or assessed with clinical judgement   ADL Overall ADL's : Needs assistance/impaired Eating/Feeding: Independent   Grooming: Minimal assistance   Upper Body Bathing: Minimal assistance   Lower Body Bathing: Maximal assistance;Sit to/from stand;+2 for  safety/equipment   Upper Body Dressing : Moderate assistance   Lower Body Dressing: Total assistance;Sit to/from stand   Toilet Transfer: Minimal assistance;+2 for safety/equipment;+2 for physical assistance;Stand-pivot;BSC;RW   Toileting- Clothing Manipulation and Hygiene: Moderate assistance;Sit to/from stand         General ADL Comments: limited ADLs/ascites.  Pt also has IVs in LUE and she limits use for comfort     Vision         Perception     Praxis      Pertinent Vitals/Pain Pain Assessment: Faces Faces Pain Scale: Hurts little more Pain Location: L knee Pain Descriptors / Indicators: Aching Pain Intervention(s): Limited activity within patient's tolerance;Monitored during session;Repositioned(declines meds)     Hand Dominance     Extremity/Trunk Assessment Upper Extremity Assessment Upper Extremity Assessment: Generalized weakness(2 ivs in LUE)           Communication Communication Communication: No difficulties   Cognition Arousal/Alertness: Awake/alert Behavior During Therapy: WFL for tasks assessed/performed Overall Cognitive Status: Within Functional Limits for tasks assessed                                     General Comments       Exercises     Shoulder Instructions      Home Living Family/patient expects to be discharged to:: Unsure Living Arrangements: Alone  Additional Comments: lives alone with cat.  Has standard toilet, tub-which she likes to get to bottom of      Prior Functioning/Environment Level of Independence: Independent                 OT Problem List: Decreased strength;Decreased activity tolerance;Impaired balance (sitting and/or standing);Pain;Decreased knowledge of use of DME or AE      OT Treatment/Interventions: Self-care/ADL training;Energy conservation;DME and/or AE instruction;Patient/family education;Balance training;Therapeutic activities     OT Goals(Current goals can be found in the care plan section) Acute Rehab OT Goals Patient Stated Goal: return to independence; decreased pain/swelling OT Goal Formulation: With patient Time For Goal Achievement: 01/12/19 Potential to Achieve Goals: Good ADL Goals Pt Will Perform Grooming: with supervision;standing Pt Will Perform Lower Body Bathing: with supervision;sit to/from stand;with adaptive equipment Pt Will Perform Lower Body Dressing: with supervision;sit to/from stand;with adaptive equipment Pt Will Transfer to Toilet: with min guard assist;bedside commode;ambulating Pt Will Perform Toileting - Clothing Manipulation and hygiene: with supervision;sit to/from stand Additional ADL Goal #1: pt will perform UB adls with set up sitting  OT Frequency: Min 2X/week   Barriers to D/C:            Co-evaluation PT/OT/SLP Co-Evaluation/Treatment: Yes Reason for Co-Treatment: For patient/therapist safety PT goals addressed during session: Mobility/safety with mobility OT goals addressed during session: ADL's and self-care      AM-PAC OT "6 Clicks" Daily Activity     Outcome Measure Help from another person eating meals?: None Help from another person taking care of personal grooming?: A Little Help from another person toileting, which includes using toliet, bedpan, or urinal?: A Lot Help from another person bathing (including washing, rinsing, drying)?: A Lot Help from another person to put on and taking off regular upper body clothing?: A Lot Help from another person to put on and taking off regular lower body clothing?: Total 6 Click Score: 14   End of Session Nurse Communication: Mobility status  Activity Tolerance: Patient tolerated treatment well Patient left: in chair;with call bell/phone within reach;with chair alarm set  OT Visit Diagnosis: Unsteadiness on feet (R26.81);Muscle weakness (generalized) (M62.81)                Time: 9604-5409 OT Time Calculation (min):  25 min Charges:  OT General Charges $OT Visit: 1 Visit OT Evaluation $OT Eval Low Complexity: [place], OTR/L Acute Rehabilitation Services [phone] [facility] pager [phone] office 12/29/2018  [place] 12/29/2018, 11:41 AM

## 2018-12-29 NOTE — Progress Notes (Signed)
ANTICOAGULATION CONSULT NOTE -  Consult  Pharmacy Consult for IV heparin Indication: DVT  Allergies  Allergen Reactions  . Penicillins Rash    DID THE REACTION INVOLVE: Swelling of the face/tongue/throat, SOB, or low BP? No Sudden or severe rash/hives, skin peeling, or the inside of the mouth or nose? No Did it require medical treatment? No When did it last happen?childhood allergy If all above answers are "NO", may proceed with cephalosporin use.    Patient Measurements: Height: 5\' 7"  (170.2 cm) Weight: 175 lb (79.4 kg) IBW/kg (Calculated) : 61.6 Heparin Dosing Weight: 77  Vital Signs: Temp: 98.6 F (37 C) (12/31 1338) Temp Source: Oral (12/31 1338) BP: 122/63 (12/31 1338) Pulse Rate: 74 (12/31 1338)  Labs: Recent Labs    12/27/18 1108 12/28/18 0206  12/28/18 1902 12/29/18 0417 12/29/18 1315  HGB 14.2 12.9  --   --  10.9*  --   HCT 44.6 40.4  --   --  33.5*  --   PLT 172 186  --   --  184  --   LABPROT 15.3*  --   --   --   --   --   INR 1.22  --   --   --   --   --   HEPARINUNFRC  --  0.75*   < > 0.26* 0.69 0.54  CREATININE 1.87* 2.38*  --   --  2.11*  --    < > = values in this interval not displayed.    Estimated Creatinine Clearance: 25.8 mL/min (A) (by C-G formula based on SCr of 2.11 mg/dL (H)).   Medical History: History reviewed. No pertinent past medical history.  Assessment: 72 yo female with acute extensive left DVT to start IV heparin per Rx dosing. Baseline labs drawn.  GYN-[facility] rec IR for  Paracentesis and cytology of ascites.  12/29/2018 Heparin level therapeutic at 0.54 after rate decreased to 1150 units/hr. Hg 14.2>12.9>10.9. PLTC WNL, s/p paracentesis 12/30.     Goal of Therapy:  Heparin level 0.3-0.7 units/ml Monitor platelets by anticoagulation protocol: Yes   Plan:   Continue Heparin at 1150 units/hr  daily Heparin level and daily CBC  Eudelia Bunch, Pharm.D [phone] 12/29/2018 2:01 PM

## 2018-12-29 NOTE — Progress Notes (Signed)
PROGRESS NOTE  Haley Palmer TKZ:[id number] DOB: 1946/06/25 DOA: 12/27/2018 PCP: Patient, No Pcp Per  HPI/Brief Narrative  Haley Palmer is a 72 y.o. year old female with no significant medical history who presented on 12/27/2018 with unintentional weight gain, enlarging abdomen, dyspnea on exertion for several months and new left leg swelling and pain over 2 days and was found to have ovarian mass with associated ascites and acute left leg DVT.  Subjective Feels much better after paracentesis Eating ok  Assessment/Plan:  #AcuteLeft leg DVT.  DVT ultrasound confirmed acute thrombosis of of left common femoral vein and left femoral vein as well as age indeterminate DVT of left  Proximal profunda and left external iliac vein.  Currently on IV heparin drip, continue to monitor CBC.  Could be related to likely malignancy or decreased mobilization related to significant ascites. IR consultation placed for IVC filter placement  #Ascites, concern for malignant, improving slightly.  Status post 5 L removed by paracentesis on 12/30.  Diagnostic ascitic fluid analysis negative for SBP.  Nontoxic abdomen on exam. 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  Awaiting GYN oncology recommends eventual surgical management pending medical clearance, pending IR evaluation for IVC filter  #Leukocytosis.  Remains afebrile, no localizing signs or symptoms of infection.  May be stress-induced related to significant ascites/DVT/possible malignancy.  Low threshold to initiate antibiotics, obtain cultures if has fever.  #AKI, improving. Seems prerenal given diminished p.o. intake and slow improvement with IVF. No obstruction on CT abdomen. Has had minimal output on foley so will watch closely. Nephrology will also obtain UPC . It is possible she has had CKD ( unclear as no prior labwork due to no medical  care in several years). Avoid nephrotoxins, renally dose medications.  UA shows many bacteria but trace leukocytes and 0-5 WBC with no urinary symptoms.    #Scattered pulmonary nodules throughout lungs bilaterally.  Nonspecific, could be benign, will need follow-up imaging to ensure not metastatic disease   Cultures:  None    DVT prophylaxis: Consultants: Gyne/onc, IR  Procedures:  Ultrasound-guided diagnostic and therapeutic paracentesis, 12/30, 5 L removed  Antimicrobials:  Code Status: Full code  Family Communication: No family at bedside  Disposition Plan: Monitor on IV heparin, further management of ovarian mass/diagnostic/treatment, pending recommendations, monitoring kidney function.        Objective: Vitals:   12/28/18 1527 12/28/18 2141 12/29/18 0539 12/29/18 1338  BP: 127/70 138/68 127/69 122/63  Pulse:  95 80 74  Resp:  16 14 16   Temp:  98.4 F (36.9 C) 98.3 F (36.8 C) 98.6 F (37 C)  TempSrc:  Oral Oral Oral  SpO2:  98% 96% 99%  Weight:      Height:        Intake/Output Summary (Last 24 hours) at 12/29/2018 1546 Last data filed at 12/29/2018 1302 Gross per 24 hour  Intake 3342.6 ml  Output 800 ml  Net 2542.6 ml   Filed Weights   12/27/18 1050  Weight: 79.4 kg    Exam:  Constitutional: Thin in upper extremities, elderly female in no distress Eyes: EOMI, anicteric, normal conjunctivae ENMT: Oropharynx with moist mucous membranes, Neck: FROM Cardiovascular: RRR no MRGs, with 2+ pitting edema of entire left lower extremity( from foot to upper thigh) Respiratory: Normal respiratory effort on room air, clear breath sounds  Abdomen: Taut, nontender to palpation, minimal bowel sounds, no  fluid wave, significantly distended abdomen(slightly improved from prior) Skin: No rash ulcers, or lesions. Without skin tenting  Neurologic: Grossly no focal neuro deficit. Psychiatric:Appropriate affect, and mood. Mental status AAOx3  Data  Reviewed: CBC: Recent Labs  Lab 12/27/18 1108 12/28/18 0206 12/29/18 0417  WBC 13.0* 16.8* 13.2*  NEUTROABS 11.2*  --   --   HGB 14.2 12.9 10.9*  HCT 44.6 40.4 33.5*  MCV 91.4 91.2 90.3  PLT 172 186 979   Basic Metabolic Panel: Recent Labs  Lab 12/27/18 1108 12/28/18 0206 12/29/18 0417  NA 138 137 137  K 4.7 4.7 4.3  CL 101 104 108  CO2 20* 18* 21*  GLUCOSE 133* 123* 111*  BUN 38* 49* 60*  CREATININE 1.87* 2.38* 2.11*  CALCIUM 10.1 9.2 8.8*   GFR: Estimated Creatinine Clearance: 25.8 mL/min (A) (by C-G formula based on SCr of 2.11 mg/dL (H)). Liver Function Tests: Recent Labs  Lab 12/27/18 1108  AST 32  ALT 21  ALKPHOS 84  BILITOT 0.9  PROT 9.2*  ALBUMIN 3.6   Recent Labs  Lab 12/27/18 1108  LIPASE 34   No results for input(s): AMMONIA in the last 168 hours. Coagulation Profile: Recent Labs  Lab 12/27/18 1108  INR 1.22   Cardiac Enzymes: No results for input(s): CKTOTAL, CKMB, CKMBINDEX, TROPONINI in the last 168 hours. BNP (last 3 results) No results for input(s): PROBNP in the last 8760 hours. HbA1C: No results for input(s): HGBA1C in the last 72 hours. CBG: No results for input(s): GLUCAP in the last 168 hours. Lipid Profile: No results for input(s): CHOL, HDL, LDLCALC, TRIG, CHOLHDL, LDLDIRECT in the last 72 hours. Thyroid Function Tests: Recent Labs    12/28/18 0206  TSH 1.162   Anemia Panel: No results for input(s): VITAMINB12, FOLATE, FERRITIN, TIBC, IRON, RETICCTPCT in the last 72 hours. Urine analysis:    Component Value Date/Time   COLORURINE AMBER (A) 12/28/2018 0527   APPEARANCEUR CLOUDY (A) 12/28/2018 0527   LABSPEC 1.019 12/28/2018 0527   PHURINE 5.0 12/28/2018 0527   GLUCOSEU NEGATIVE 12/28/2018 0527   HGBUR MODERATE (A) 12/28/2018 0527   BILIRUBINUR NEGATIVE 12/28/2018 0527   KETONESUR 5 (A) 12/28/2018 0527   PROTEINUR 30 (A) 12/28/2018 0527   NITRITE NEGATIVE 12/28/2018 0527   LEUKOCYTESUR TRACE (A) 12/28/2018 0527    Sepsis Labs: @LABRCNTIP (procalcitonin:4,lacticidven:4)  ) Recent Results (from the past 240 hour(s))  Culture, body fluid-bottle     Status: None (Preliminary result)   Collection Time: 12/28/18  3:05 PM  Result Value Ref Range Status   Specimen Description PERITONEAL  Final   Special Requests NONE  Final   Culture   Final    NO GROWTH < 24 HOURS Performed at [facility], [place] [address], [place], [place] [address]    Report Status PENDING  Incomplete  Gram stain     Status: None   Collection Time: 12/28/18  3:05 PM  Result Value Ref Range Status   Specimen Description PERITONEAL  Final   Special Requests NONE  Final   Gram Stain   Final    RARE WBC PRESENT, PREDOMINANTLY MONONUCLEAR NO ORGANISMS SEEN Performed at [facility], [address] [place], [place] [address]    Report Status 12/29/2018 FINAL  Final      Studies: US Paracentesis  Result Date: 12/28/2018 INDICATION: Patient with history of lower extremity DVT, abdomino-pelvic mass, renal insufficiency, ascites ; request made for diagnostic and therapeutic paracentesis up to 5  liters. EXAM: ULTRASOUND GUIDED DIAGNOSTIC AND THERAPEUTIC PARACENTESIS MEDICATIONS: None COMPLICATIONS: None immediate. PROCEDURE: Informed written consent was obtained from the patient after a discussion of the risks, benefits and alternatives to treatment. A timeout was performed prior to the initiation of the procedure. Initial ultrasound scanning demonstrates a large amount of ascites within the left mid to lower abdominal quadrant. The left mid to lower abdomen was prepped and draped in the usual sterile fashion. 1% lidocaine was used for local anesthesia. Following this, a 19 gauge, 7-cm, Yueh catheter was introduced. An ultrasound image was saved for documentation purposes. The paracentesis was performed. The catheter was removed and a dressing was applied. The patient tolerated the procedure well without immediate post  procedural complication. FINDINGS: A total of approximately 5 liters of slightly hazy, yellow fluid was removed. Samples were sent to the laboratory as requested by the clinical team. IMPRESSION: Successful ultrasound-guided diagnostic and therapeutic paracentesis yielding 5 liters of peritoneal fluid. Read by: Rowe Robert, PA-C Electronically Signed   By: Sandi Mariscal M.D.   On: 12/28/2018 15:45    Scheduled Meds: . ondansetron (ZOFRAN) IV  4 mg Intravenous Once  . pneumococcal 23 valent vaccine  0.5 mL Intramuscular Tomorrow-1000    Continuous Infusions: . sodium chloride 100 mL/hr at 12/29/18 1258  . heparin 1,150 Units/hr (12/29/18 0507)     LOS: 2 days     Desiree Hane, MD [facility] Pager [phone]  If 7PM-7AM, please contact night-coverage [email or web address] Password [facility] 12/29/2018, 3:46 PM

## 2018-12-30 ENCOUNTER — Other Ambulatory Visit: Payer: Self-pay

## 2018-12-30 ENCOUNTER — Inpatient Hospital Stay (HOSPITAL_COMMUNITY): Payer: Medicare Other

## 2018-12-30 DIAGNOSIS — R0602 Shortness of breath: Secondary | ICD-10-CM

## 2018-12-30 DIAGNOSIS — N179 Acute kidney failure, unspecified: Secondary | ICD-10-CM

## 2018-12-30 DIAGNOSIS — R1907 Generalized intra-abdominal and pelvic swelling, mass and lump: Secondary | ICD-10-CM

## 2018-12-30 DIAGNOSIS — I82409 Acute embolism and thrombosis of unspecified deep veins of unspecified lower extremity: Secondary | ICD-10-CM

## 2018-12-30 HISTORY — DX: Acute kidney failure, unspecified: N17.9

## 2018-12-30 HISTORY — DX: Acute embolism and thrombosis of unspecified deep veins of unspecified lower extremity: I82.409

## 2018-12-30 LAB — BASIC METABOLIC PANEL
Anion gap: 8 (ref 5–15)
BUN: 50 mg/dL — ABNORMAL HIGH (ref 8–23)
CALCIUM: 8.5 mg/dL — AB (ref 8.9–10.3)
CO2: 20 mmol/L — ABNORMAL LOW (ref 22–32)
Chloride: 110 mmol/L (ref 98–111)
Creatinine, Ser: 1.59 mg/dL — ABNORMAL HIGH (ref 0.44–1.00)
GFR calc Af Amer: 37 mL/min — ABNORMAL LOW (ref >60)
GFR calc non Af Amer: 32 mL/min — ABNORMAL LOW (ref >60)
Glucose, Bld: 109 mg/dL — ABNORMAL HIGH (ref 70–99)
Potassium: 4.4 mmol/L (ref 3.5–5.1)
Sodium: 138 mmol/L (ref 135–145)

## 2018-12-30 LAB — CBC
HCT: 33.3 % — ABNORMAL LOW (ref 36.0–46.0)
Hemoglobin: 10.5 g/dL — ABNORMAL LOW (ref 12.0–15.0)
MCH: 29.3 pg (ref 26.0–34.0)
MCHC: 31.5 g/dL (ref 30.0–36.0)
MCV: 93 fL (ref 80.0–100.0)
PLATELETS: 206 10*3/uL (ref 150–400)
RBC: 3.58 MIL/uL — ABNORMAL LOW (ref 3.87–5.11)
RDW: 13.4 % (ref 11.5–15.5)
WBC: 10.7 10*3/uL — ABNORMAL HIGH (ref 4.0–10.5)
nRBC: 0 % (ref 0.0–0.2)

## 2018-12-30 LAB — ECHOCARDIOGRAM COMPLETE
Height: 67 in
Weight: 2800 oz

## 2018-12-30 LAB — CEA: CEA: 3.4 ng/mL (ref 0.0–4.7)

## 2018-12-30 LAB — HEPARIN LEVEL (UNFRACTIONATED): Heparin Unfractionated: 0.49 IU/mL (ref 0.30–0.70)

## 2018-12-30 MED ORDER — BISACODYL 5 MG PO TBEC
10.0000 mg | DELAYED_RELEASE_TABLET | Freq: Every day | ORAL | Status: DC
  Administered 2018-12-30 – 2019-01-08 (×7): 10 mg via ORAL
  Filled 2018-12-30 (×9): qty 2

## 2018-12-30 NOTE — Progress Notes (Signed)
Haley Palmer Progress Note    Assessment/ Plan:   1. AKI on CKD? Unclear at this time given no labwork in 35 years and only time will tell where her true baseline is. Her urine microscopy is not significant for any active disease and by history she certainly has e/o volume depletion.  - Renal function continues to improve as a result of the isotonic fluid administration. There is no hydronephrosis or e/o obstruction on CT noncontrast. By history she was not consuming much liquids or solids at home. - Continue isotonic fluid at current rate; no e/o dyspnea.  Will sign off at this time; please reconsult as needed. Thanks.  2. 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 3. Acute left leg DVT on heparin gtt. 4. Ascites s/p 5L LVP and going again tomorrow.  Subjective:   She has a great attitude. Denies f/c/n/v/dizziness/cp.   Objective:   BP (!) 134/52 (BP Location: Right Arm)   Pulse 80   Temp 98.3 F (36.8 C) (Oral)   Resp 16   Ht 5\' 7"  (1.702 m)   Wt 79.4 kg   SpO2 97%   BMI 27.41 kg/m   Intake/Output Summary (Last 24 hours) at 12/30/2018 1354 Last data filed at 12/30/2018 1000 Gross per 24 hour  Intake 3214.59 ml  Output 1300 ml  Net 1914.59 ml   Weight change:   Physical Exam: Gen: NAD A&Ox4 Lungs: CTA B/L CV: RRR, No S3 or S4 Abd: Ascites present, firm mass on right side of umbilicus Ext: LLE edema   Imaging: US Paracentesis  Result Date: 12/28/2018 INDICATION: Patient with history of lower extremity DVT, abdomino-pelvic mass, renal insufficiency, ascites ; request made for diagnostic and therapeutic paracentesis up to 5 liters. EXAM: ULTRASOUND GUIDED DIAGNOSTIC AND THERAPEUTIC PARACENTESIS MEDICATIONS: None COMPLICATIONS: None immediate. PROCEDURE: Informed written consent was obtained from the patient after a discussion of the risks, benefits and alternatives to treatment. A timeout was performed prior to  the initiation of the procedure. Initial ultrasound scanning demonstrates a large amount of ascites within the left mid to lower abdominal quadrant. The left mid to lower abdomen was prepped and draped in the usual sterile fashion. 1% lidocaine was used for local anesthesia. Following this, a 19 gauge, 7-cm, Yueh catheter was introduced. An ultrasound image was saved for documentation purposes. The paracentesis was performed. The catheter was removed and a dressing was applied. The patient tolerated the procedure well without immediate post procedural complication. FINDINGS: A total of approximately 5 liters of slightly hazy, yellow fluid was removed. Samples were sent to the laboratory as requested by the clinical team. IMPRESSION: Successful ultrasound-guided diagnostic and therapeutic paracentesis yielding 5 liters of peritoneal fluid. Read by: Rowe Robert, PA-C Electronically Signed   By: Sandi Mariscal M.D.   On: 12/28/2018 15:45    Labs: BMET Recent Labs  Lab 12/27/18 1108 12/28/18 0206 12/29/18 0417 12/30/18 0517  NA 138 137 137 138  K 4.7 4.7 4.3 4.4  CL 101 104 108 110  CO2 20* 18* 21* 20*  GLUCOSE 133* 123* 111* 109*  BUN 38* 49* 60* 50*  CREATININE 1.87* 2.38* 2.11* 1.59*  CALCIUM 10.1 9.2 8.8* 8.5*   CBC Recent Labs  Lab 12/27/18 1108 12/28/18 0206 12/29/18 0417 12/30/18 0517  WBC 13.0* 16.8* 13.2* 10.7*  NEUTROABS 11.2*  --   --   --   HGB 14.2 12.9 10.9* 10.5*  HCT 44.6 40.4 33.5* 33.3*  MCV 91.4  91.2 90.3 93.0  PLT 172 186 184 206    Medications:    . bisacodyl  10 mg Oral Daily  . ondansetron (ZOFRAN) IV  4 mg Intravenous Once  . pneumococcal 23 valent vaccine  0.5 mL Intramuscular Tomorrow-1000      Otelia Santee, MD 12/30/2018, 1:54 PM

## 2018-12-30 NOTE — Progress Notes (Signed)
ANTICOAGULATION CONSULT NOTE -  Consult  Pharmacy Consult for IV heparin Indication: DVT  Allergies  Allergen Reactions  . Penicillins Rash    DID THE REACTION INVOLVE: Swelling of the face/tongue/throat, SOB, or low BP? No Sudden or severe rash/hives, skin peeling, or the inside of the mouth or nose? No Did it require medical treatment? No When did it last happen?childhood allergy If all above answers are "NO", may proceed with cephalosporin use.    Patient Measurements: Height: 5\' 7"  (170.2 cm) Weight: 175 lb (79.4 kg) IBW/kg (Calculated) : 61.6 Heparin Dosing Weight: 77  Vital Signs: Temp: 98.3 F (36.8 C) (01/01 0532) Temp Source: Oral (01/01 0532) BP: 134/52 (01/01 0532) Pulse Rate: 80 (01/01 0532)  Labs: Recent Labs    12/27/18 1108 12/28/18 0206  12/29/18 0417 12/29/18 1315 12/30/18 0517  HGB 14.2 12.9  --  10.9*  --  10.5*  HCT 44.6 40.4  --  33.5*  --  33.3*  PLT 172 186  --  184  --  206  LABPROT 15.3*  --   --   --   --   --   INR 1.22  --   --   --   --   --   HEPARINUNFRC  --  0.75*   < > 0.69 0.54 0.49  CREATININE 1.87* 2.38*  --  2.11*  --  1.59*   < > = values in this interval not displayed.    Estimated Creatinine Clearance: 34.2 mL/min (A) (by C-G formula based on SCr of 1.59 mg/dL (H)).   Medical History: History reviewed. No pertinent past medical history.  Assessment: 73 yo female with acute extensive left DVT to start IV heparin per Rx dosing. Baseline labs drawn.  GYN-[facility] rec IR for  Paracentesis and cytology of ascites.  12/30/2018 Heparin level therapeutic at 0.49 at 1150 units/hr. Hg 14.2>12.9>10.5. PLTC WNL, s/p paracentesis 12/30 > 5L Plan for IVC filter 1/2  Goal of Therapy:  Heparin level 0.3-0.7 units/ml Monitor platelets by anticoagulation protocol: Yes   Plan:   Continue Heparin at 1150 units/hr  daily Heparin level and daily CBC  Plan for IVC filter 1/2, ?hold Heparin prior to procedure?  Minda Ditto  PharmD Pager [phone] 12/30/2018, 7:48 AM

## 2018-12-30 NOTE — Progress Notes (Signed)
PROGRESS NOTE    Haley Palmer  BLT:[id number] DOB: [birth year] DOA: 12/27/2018 PCP: Patient, No Pcp Per  Brief Narrative: 73 y.o. year old female with no significant medical history who presented on 12/27/2018 with unintentional weight gain, enlarging abdomen, dyspnea on exertion for several months and new left leg swelling and pain over 2 days and was found to have ovarian mass with associated ascites and acute left leg DVT  Assessment & Plan:   Active Problems:   Acute DVT (deep venous thrombosis) (HCC)  #AcuteLeft leg DVT.  DVT ultrasound confirmed acute thrombosis of of left common femoral vein and left femoral vein as well as age indeterminate DVT of left  Proximal profunda and left external iliac vein.  Currently on IV heparin drip, continue to monitor CBC.  IR to do IVC filter 12/31/2018  #Ascites, concern for malignant, improving slightly.  Status post 5 L removed by paracentesis on 12/30.  Diagnostic ascitic fluid analysis negative for SBP.  Abdomen is starting to become more distended.  We will schedule her for paracentesis for tomorrow.   #Ovarian mass.  GYN notes reviewed for eventual surgical management.  #Leukocytosis.  Remains afebrile, no localizing signs or symptoms of infection.  May be stress-induced related to significant ascites/DVT/possible malignancy.  Low threshold to initiate antibiotics, obtain cultures if has fever.  #AKI, improving. Seems prerenal given diminished p.o. intake and slow improvement with IVF. No obstruction on CT abdomen. Has had minimal output on foley so will watch closely. Nephrology will also obtain UPC . It is possible she has had CKD ( unclear as no prior labwork due to no medical care in several years). Avoid nephrotoxins, renally dose medications.  UA shows many bacteria but trace leukocytes and 0-5 WBC with no urinary symptoms.       #Scattered pulmonary nodules throughout lungs bilaterally.  Nonspecific, could be benign, will need  follow-up imaging to ensure not metastatic disease   Estimated body mass index is 27.41 kg/m as calculated from the following:   Height as of this encounter: 5\' 7"  (1.702 m).   Weight as of this encounter: 79.4 kg.  DVT prophylaxis:  Code Status: Full code Family Communication: She does not have any family Disposition Plan: Pending clinical improvement   Consultants:   Interventional radiology oncology gynecology  Procedures paracentesis 12/28/2018 Antimicrobials: None Subjective: Complains of feeling better after 5 L of fluid taken out of the belly but it is starting to reaccumulate denies shortness of breath at this time  Objective: Vitals:   12/29/18 1338 12/29/18 2233 12/30/18 0110 12/30/18 0532  BP: 122/63 126/66 (!) 143/71 (!) 134/52  Pulse: 74 89 81 80  Resp: 16 16 16 16   Temp: 98.6 F (37 C) 97.7 F (36.5 C) 98.9 F (37.2 C) 98.3 F (36.8 C)  TempSrc: Oral Oral Oral Oral  SpO2: 99% 95% 96% 97%  Weight:      Height:        Intake/Output Summary (Last 24 hours) at 12/30/2018 1027 Last data filed at 12/30/2018 1000 Gross per 24 hour  Intake 3775.05 ml  Output 1500 ml  Net 2275.05 ml   Filed Weights   12/27/18 1050  Weight: 79.4 kg    Examination:  General exam: Appears calm and comfortable  Respiratory system: Clear to auscultation. Respiratory effort normal. Cardiovascular system: S1 & S2 heard, RRR. No JVD, murmurs, rubs, gallops or clicks. No pedal edema. Gastrointestinal system: Abdomen is nondistended, soft and nontender. No organomegaly or masses felt. Normal bowel  sounds heard. Central nervous system: Alert and oriented. No focal neurological deficits. Extremities: Symmetric 5 x 5 power. Skin: No rashes, lesions or ulcers Psychiatry: Judgement and insight appear normal. Mood & affect appropriate.     Data Reviewed: I have personally reviewed following labs and imaging studies  CBC: Recent Labs  Lab 12/27/18 1108 12/28/18 0206  12/29/18 0417 12/30/18 0517  WBC 13.0* 16.8* 13.2* 10.7*  NEUTROABS 11.2*  --   --   --   HGB 14.2 12.9 10.9* 10.5*  HCT 44.6 40.4 33.5* 33.3*  MCV 91.4 91.2 90.3 93.0  PLT 172 186 184 175   Basic Metabolic Panel: Recent Labs  Lab 12/27/18 1108 12/28/18 0206 12/29/18 0417 12/30/18 0517  NA 138 137 137 138  K 4.7 4.7 4.3 4.4  CL 101 104 108 110  CO2 20* 18* 21* 20*  GLUCOSE 133* 123* 111* 109*  BUN 38* 49* 60* 50*  CREATININE 1.87* 2.38* 2.11* 1.59*  CALCIUM 10.1 9.2 8.8* 8.5*   GFR: Estimated Creatinine Clearance: 34.2 mL/min (A) (by C-G formula based on SCr of 1.59 mg/dL (H)). Liver Function Tests: Recent Labs  Lab 12/27/18 1108  AST 32  ALT 21  ALKPHOS 84  BILITOT 0.9  PROT 9.2*  ALBUMIN 3.6   Recent Labs  Lab 12/27/18 1108  LIPASE 34   No results for input(s): AMMONIA in the last 168 hours. Coagulation Profile: Recent Labs  Lab 12/27/18 1108  INR 1.22   Cardiac Enzymes: No results for input(s): CKTOTAL, CKMB, CKMBINDEX, TROPONINI in the last 168 hours. BNP (last 3 results) No results for input(s): PROBNP in the last 8760 hours. HbA1C: No results for input(s): HGBA1C in the last 72 hours. CBG: No results for input(s): GLUCAP in the last 168 hours. Lipid Profile: No results for input(s): CHOL, HDL, LDLCALC, TRIG, CHOLHDL, LDLDIRECT in the last 72 hours. Thyroid Function Tests: Recent Labs    12/28/18 0206  TSH 1.162   Anemia Panel: No results for input(s): VITAMINB12, FOLATE, FERRITIN, TIBC, IRON, RETICCTPCT in the last 72 hours. Sepsis Labs: No results for input(s): PROCALCITON, LATICACIDVEN in the last 168 hours.  Recent Results (from the past 240 hour(s))  Culture, body fluid-bottle     Status: None (Preliminary result)   Collection Time: 12/28/18  3:05 PM  Result Value Ref Range Status   Specimen Description PERITONEAL  Final   Special Requests NONE  Final   Culture   Final    NO GROWTH < 24 HOURS Performed at [facility], [address], [place], [place] [address]    Report Status PENDING  Incomplete  Gram stain     Status: None   Collection Time: 12/28/18  3:05 PM  Result Value Ref Range Status   Specimen Description PERITONEAL  Final   Special Requests NONE  Final   Gram Stain   Final    RARE WBC PRESENT, PREDOMINANTLY MONONUCLEAR NO ORGANISMS SEEN Performed at [facility], [address], [place], [place] [address]    Report Status 12/29/2018 FINAL  Final         Radiology Studies: US Paracentesis  Result Date: 12/28/2018 INDICATION: Patient with history of lower extremity DVT, abdomino-pelvic mass, renal insufficiency, ascites ; request made for diagnostic and therapeutic paracentesis up to 5 liters. EXAM: ULTRASOUND GUIDED DIAGNOSTIC AND THERAPEUTIC PARACENTESIS MEDICATIONS: None COMPLICATIONS: None immediate. PROCEDURE: Informed written consent was obtained from the patient after a discussion of the risks, benefits and alternatives to treatment.  A timeout was performed prior to the initiation of the procedure. Initial ultrasound scanning demonstrates a large amount of ascites within the left mid to lower abdominal quadrant. The left mid to lower abdomen was prepped and draped in the usual sterile fashion. 1% lidocaine was used for local anesthesia. Following this, a 19 gauge, 7-cm, Yueh catheter was introduced. An ultrasound image was saved for documentation purposes. The paracentesis was performed. The catheter was removed and a dressing was applied. The patient tolerated the procedure well without immediate post procedural complication. FINDINGS: A total of approximately 5 liters of slightly hazy, yellow fluid was removed. Samples were sent to the laboratory as requested by the clinical team. IMPRESSION: Successful ultrasound-guided diagnostic and therapeutic paracentesis yielding 5 liters of peritoneal fluid. Read by: Rowe Robert, PA-C Electronically Signed   By: Sandi Mariscal M.D.   On:  12/28/2018 15:45        Scheduled Meds: . bisacodyl  10 mg Oral Daily  . ondansetron (ZOFRAN) IV  4 mg Intravenous Once  . pneumococcal 23 valent vaccine  0.5 mL Intramuscular Tomorrow-1000   Continuous Infusions: . sodium chloride 100 mL/hr at 12/30/18 0412  . heparin 1,150 Units/hr (12/29/18 1719)     LOS: 3 days     Georgette Shell, MD [facility]  If 7PM-7AM, please contact night-coverage [email or web address] Password [facility] 12/30/2018, 10:27 AM

## 2018-12-30 NOTE — Progress Notes (Signed)
Subjective: No new complaints.  Appetite improved.  Objective: Vital signs in last 24 hours: Temp:  [97.7 F (36.5 C)-98.9 F (37.2 C)] 98.3 F (36.8 C) (01/01 0532) Pulse Rate:  [74-89] 80 (01/01 0532) Resp:  [16] 16 (01/01 0532) BP: (122-143)/(52-71) 134/52 (01/01 0532) SpO2:  [95 %-99 %] 97 % (01/01 0532)  Intake/Output from previous day: 12/31 0701 - 01/01 0700 In: 3766.6 [P.O.:1200; I.V.:2566.6] Out: 1475 [Urine:1475] Intake/Output this shift: Total I/O In: 686 [P.O.:240; I.V.:446] Out: 225 [Urine:225]  General appearance: alert Abdomen: soft, NT Extremities: LLE edema  Results for orders placed or performed during the hospital encounter of 12/27/18 (from the past 24 hour(s))  Heparin level (unfractionated)     Status: None   Collection Time: 12/29/18  1:15 PM  Result Value Ref Range   Heparin Unfractionated 0.54 0.30 - 0.70 IU/mL  CEA     Status: None   Collection Time: 12/29/18  3:26 PM  Result Value Ref Range   CEA 3.4 0.0 - 4.7 ng/mL  Prealbumin     Status: Abnormal   Collection Time: 12/29/18 10:26 PM  Result Value Ref Range   Prealbumin 9.4 (L) 18 - 38 mg/dL  CBC     Status: Abnormal   Collection Time: 12/30/18  5:17 AM  Result Value Ref Range   WBC 10.7 (H) 4.0 - 10.5 K/uL   RBC 3.58 (L) 3.87 - 5.11 MIL/uL   Hemoglobin 10.5 (L) 12.0 - 15.0 g/dL   HCT 33.3 (L) 36.0 - 46.0 %   MCV 93.0 80.0 - 100.0 fL   MCH 29.3 26.0 - 34.0 pg   MCHC 31.5 30.0 - 36.0 g/dL   RDW 13.4 11.5 - 15.5 %   Platelets 206 150 - 400 K/uL   nRBC 0.0 0.0 - 0.2 %  Basic metabolic panel     Status: Abnormal   Collection Time: 12/30/18  5:17 AM  Result Value Ref Range   Sodium 138 135 - 145 mmol/L   Potassium 4.4 3.5 - 5.1 mmol/L   Chloride 110 98 - 111 mmol/L   CO2 20 (L) 22 - 32 mmol/L   Glucose, Bld 109 (H) 70 - 99 mg/dL   BUN 50 (H) 8 - 23 mg/dL   Creatinine, Ser 1.59 (H) 0.44 - 1.00 mg/dL   Calcium 8.5 (L) 8.9 - 10.3 mg/dL   GFR calc non Af Amer 32 (L) >60 mL/min   GFR  calc Af Amer 37 (L) >60 mL/min   Anion gap 8 5 - 15  Heparin level (unfractionated)     Status: None   Collection Time: 12/30/18  5:17 AM  Result Value Ref Range   Heparin Unfractionated 0.49 0.30 - 0.70 IU/mL    Studies/Results: Ct Abdomen Pelvis Wo Contrast  Result Date: 12/27/2018 CLINICAL DATA:  72 year old female with history of abdominal distension. Evaluate for potential mass. EXAM: CT CHEST, ABDOMEN AND PELVIS WITHOUT CONTRAST TECHNIQUE: Multidetector CT imaging of the chest, abdomen and pelvis was performed following the standard protocol without IV contrast. COMPARISON:  None. FINDINGS: CT CHEST FINDINGS Cardiovascular: Heart size is normal. There is no significant pericardial fluid, thickening or pericardial calcification. Atherosclerotic calcifications in the left anterior descending coronary artery. Mediastinum/Nodes: No pathologically enlarged mediastinal or hilar lymph nodes. Moderate-sized hiatal hernia. No axillary lymphadenopathy. Lungs/Pleura: Mild diffuse bronchial wall thickening with mild centrilobular and paraseptal emphysema. Innumerable tiny 1-3 mm pulmonary nodules scattered throughout the periphery of the lungs bilaterally, nonspecific. No larger more suspicious appearing pulmonary nodules or  masses are noted. No acute consolidative airspace disease. No pleural effusions. Areas of linear scarring noted in the right middle lobe, inferior segment of the lingula and anterior aspect of the left lower lobe. Musculoskeletal: There are no aggressive appearing lytic or blastic lesions noted in the visualized portions of the skeleton. CT ABDOMEN PELVIS FINDINGS Hepatobiliary: No definite cystic or solid hepatic lesions are confidently identified on today's noncontrast CT examination. Gallbladder is not confidently identified may be surgically absent, or may be obscured by adjacent ascites. Pancreas: No definite pancreatic mass noted on today's noncontrast CT examination. No definite  surrounding peripancreatic inflammatory changes. Spleen: Unremarkable. Adrenals/Urinary Tract: Unenhanced appearance of the kidneys and bilateral adrenal glands is normal. No hydroureteronephrosis. Urinary bladder is poorly demonstrated, but unremarkable in appearance. Stomach/Bowel: Normal appearance of the stomach. No pathologic dilatation of small bowel or colon. Appendix is not confidently identified. Vascular/Lymphatic: Aortic atherosclerosis. No lymphadenopathy identified in the abdomen or pelvis on today's noncontrast CT examination. Reproductive: Unenhanced appearance of the uterus is unremarkable. In the central abdomen and pelvis there is a very large centrally low-attenuation rim enhancing mass (axial image 85 of series 2 and sagittal image 92 of series 7) measuring 28.7 x 25.6 x 25.6 cm. This appears potentially related to the right adnexal region, although the origin of this lesion is uncertain. Left ovary is not confidently identified. Other: Large volume of ascites.  No pneumoperitoneum. Musculoskeletal: There are no aggressive appearing lytic or blastic lesions noted in the visualized portions of the skeleton. IMPRESSION: 1. 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 This is associated with a very large volume of (presumably malignant) ascites. 2. Multiple tiny 1-3 mm pulmonary nodules scattered throughout the lungs bilaterally. These are nonspecific, but favored to represent benign areas of mucoid impaction within terminal bronchioles. However, close attention on follow-up studies is recommended to ensure the stability or resolution of this finding, as the possibility of metastatic disease is not excluded. 3. Aortic atherosclerosis, in addition to left anterior descending coronary artery disease. Assessment for potential risk factor modification, dietary therapy or pharmacologic therapy may be  warranted, if clinically indicated. Electronically Signed   By: Vinnie Langton M.D.   On: 12/27/2018 13:41   Ct Chest Wo Contrast  Result Date: 12/27/2018 CLINICAL DATA:  73 year old female with history of abdominal distension. Evaluate for potential mass. EXAM: CT CHEST, ABDOMEN AND PELVIS WITHOUT CONTRAST TECHNIQUE: Multidetector CT imaging of the chest, abdomen and pelvis was performed following the standard protocol without IV contrast. COMPARISON:  None. FINDINGS: CT CHEST FINDINGS Cardiovascular: Heart size is normal. There is no significant pericardial fluid, thickening or pericardial calcification. Atherosclerotic calcifications in the left anterior descending coronary artery. Mediastinum/Nodes: No pathologically enlarged mediastinal or hilar lymph nodes. Moderate-sized hiatal hernia. No axillary lymphadenopathy. Lungs/Pleura: Mild diffuse bronchial wall thickening with mild centrilobular and paraseptal emphysema. Innumerable tiny 1-3 mm pulmonary nodules scattered throughout the periphery of the lungs bilaterally, nonspecific. No larger more suspicious appearing pulmonary nodules or masses are noted. No acute consolidative airspace disease. No pleural effusions. Areas of linear scarring noted in the right middle lobe, inferior segment of the lingula and anterior aspect of the left lower lobe. Musculoskeletal: There are no aggressive appearing lytic or blastic lesions noted in the visualized portions of the skeleton. CT ABDOMEN PELVIS FINDINGS Hepatobiliary: No definite cystic or solid hepatic lesions are confidently identified on today's noncontrast CT examination.  Gallbladder is not confidently identified may be surgically absent, or may be obscured by adjacent ascites. Pancreas: No definite pancreatic mass noted on today's noncontrast CT examination. No definite surrounding peripancreatic inflammatory changes. Spleen: Unremarkable. Adrenals/Urinary Tract: Unenhanced appearance of the kidneys and  bilateral adrenal glands is normal. No hydroureteronephrosis. Urinary bladder is poorly demonstrated, but unremarkable in appearance. Stomach/Bowel: Normal appearance of the stomach. No pathologic dilatation of small bowel or colon. Appendix is not confidently identified. Vascular/Lymphatic: Aortic atherosclerosis. No lymphadenopathy identified in the abdomen or pelvis on today's noncontrast CT examination. Reproductive: Unenhanced appearance of the uterus is unremarkable. In the central abdomen and pelvis there is a very large centrally low-attenuation rim enhancing mass (axial image 85 of series 2 and sagittal image 92 of series 7) measuring 28.7 x 25.6 x 25.6 cm. This appears potentially related to the right adnexal region, although the origin of this lesion is uncertain. Left ovary is not confidently identified. Other: Large volume of ascites.  No pneumoperitoneum. Musculoskeletal: There are no aggressive appearing lytic or blastic lesions noted in the visualized portions of the skeleton. IMPRESSION: 1. 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 This is associated with a very large volume of (presumably malignant) ascites. 2. Multiple tiny 1-3 mm pulmonary nodules scattered throughout the lungs bilaterally. These are nonspecific, but favored to represent benign areas of mucoid impaction within terminal bronchioles. However, close attention on follow-up studies is recommended to ensure the stability or resolution of this finding, as the possibility of metastatic disease is not excluded. 3. Aortic atherosclerosis, in addition to left anterior descending coronary artery disease. Assessment for potential risk factor modification, dietary therapy or pharmacologic therapy may be warranted, if clinically indicated. Electronically Signed   By: Vinnie Langton M.D.   On: 12/27/2018 13:41   US Paracentesis  Result  Date: 12/28/2018 INDICATION: Patient with history of lower extremity DVT, abdomino-pelvic mass, renal insufficiency, ascites ; request made for diagnostic and therapeutic paracentesis up to 5 liters. EXAM: ULTRASOUND GUIDED DIAGNOSTIC AND THERAPEUTIC PARACENTESIS MEDICATIONS: None COMPLICATIONS: None immediate. PROCEDURE: Informed written consent was obtained from the patient after a discussion of the risks, benefits and alternatives to treatment. A timeout was performed prior to the initiation of the procedure. Initial ultrasound scanning demonstrates a large amount of ascites within the left mid to lower abdominal quadrant. The left mid to lower abdomen was prepped and draped in the usual sterile fashion. 1% lidocaine was used for local anesthesia. Following this, a 19 gauge, 7-cm, Yueh catheter was introduced. An ultrasound image was saved for documentation purposes. The paracentesis was performed. The catheter was removed and a dressing was applied. The patient tolerated the procedure well without immediate post procedural complication. FINDINGS: A total of approximately 5 liters of slightly hazy, yellow fluid was removed. Samples were sent to the laboratory as requested by the clinical team. IMPRESSION: Successful ultrasound-guided diagnostic and therapeutic paracentesis yielding 5 liters of peritoneal fluid. Read by: Rowe Robert, PA-C Electronically Signed   By: Sandi Mariscal M.D.   On: 12/28/2018 15:45   Dg Chest Portable 1 View  Result Date: 12/27/2018 CLINICAL DATA:  Abdominal distension and leg swelling EXAM: PORTABLE CHEST 1 VIEW COMPARISON:  None. FINDINGS: Monitoring leads overlie the patient. Normal cardiac and mediastinal contours. No consolidative pulmonary opacities. No pleural effusion or pneumothorax. Thoracic spine degenerative changes. IMPRESSION: No active disease. Electronically Signed   By: Dian Situ  Rosana Hoes M.D.   On: 12/27/2018 11:48   Vas [place] Lower Extremity Venous (dvt) (only [facility])  Result Date: 12/29/2018  Lower Venous Study Indications: Pain, Swelling, and Edema.  Limitations: Body habitus and tightenedness due to edema. Comparison Study: No prior study available. Performing Technologist: Lorina Rabon  Examination Guidelines: A complete evaluation includes B-mode imaging, spectral Doppler, color Doppler, and power Doppler as needed of all accessible portions of each vessel. Bilateral testing is considered an integral part of a complete examination. Limited examinations for reoccurring indications may be performed as noted.  Right Venous Findings: +---+---------------+---------+-----------+----------+-------+    CompressibilityPhasicitySpontaneityPropertiesSummary +---+---------------+---------+-----------+----------+-------+ CFVFull           Yes      Yes                          +---+---------------+---------+-----------+----------+-------+  Left Venous Findings: +---------+---------------+---------+-----------+----------+-----------------+          CompressibilityPhasicitySpontaneityPropertiesSummary           +---------+---------------+---------+-----------+----------+-----------------+ CFV      None           No       No                   Acute             +---------+---------------+---------+-----------+----------+-----------------+ SFJ      None                                         Acute             +---------+---------------+---------+-----------+----------+-----------------+ FV Prox  None                                         Acute             +---------+---------------+---------+-----------+----------+-----------------+ FV Mid   None                                         Acute             +---------+---------------+---------+-----------+----------+-----------------+ FV DistalNone                                         Acute              +---------+---------------+---------+-----------+----------+-----------------+ PFV      None                                         Age Indeterminate +---------+---------------+---------+-----------+----------+-----------------+ POP      None                                         Age Indeterminate +---------+---------------+---------+-----------+----------+-----------------+ PTV      None  Age Indeterminate +---------+---------------+---------+-----------+----------+-----------------+ PERO     None                                         Age Indeterminate +---------+---------------+---------+-----------+----------+-----------------+ GSV      None                                         Acute             +---------+---------------+---------+-----------+----------+-----------------+ EIV      None           No                            Acute             +---------+---------------+---------+-----------+----------+-----------------+  Summary: Right: No evidence of common femoral vein obstruction. Left: Findings consistent with acute deep vein thrombosis involving the left common femoral vein, and left femoral vein. Findings consistent with acute superficial vein thrombosis involving the left great saphenous vein. Findings consistent with age indeterminate deep vein thrombosis involving the left proximal profunda vein, left popliteal vein, left posterior tibial vein, and left peroneal vein. Left external iliac vein appears thrombosied. IVC was not viualized. No cystic structure found in the popliteal fossa.  *See table(s) above for measurements and observations. Electronically signed by Deitra Mayo MD on 12/29/2018 at 6:38:59 AM.    Final     Scheduled Meds: . bisacodyl  10 mg Oral Daily  . ondansetron (ZOFRAN) IV  4 mg Intravenous Once  . pneumococcal 23 valent vaccine  0.5 mL Intramuscular Tomorrow-1000   Continuous  Infusions: . sodium chloride 100 mL/hr at 12/30/18 0412  . 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   MRN: [id number]

## 2018-12-31 ENCOUNTER — Inpatient Hospital Stay (HOSPITAL_COMMUNITY): Payer: Medicare Other

## 2018-12-31 ENCOUNTER — Other Ambulatory Visit: Payer: Self-pay

## 2018-12-31 ENCOUNTER — Encounter (HOSPITAL_COMMUNITY): Payer: Self-pay | Admitting: Diagnostic Radiology

## 2018-12-31 HISTORY — PX: IR IVC FILTER PLMT / S&I /IMG GUID/MOD SED: IMG701

## 2018-12-31 LAB — COMPREHENSIVE METABOLIC PANEL
ALT: 26 U/L (ref 0–44)
AST: 35 U/L (ref 15–41)
Albumin: 2.2 g/dL — ABNORMAL LOW (ref 3.5–5.0)
Alkaline Phosphatase: 56 U/L (ref 38–126)
Anion gap: 9 (ref 5–15)
BUN: 38 mg/dL — ABNORMAL HIGH (ref 8–23)
CO2: 19 mmol/L — AB (ref 22–32)
Calcium: 8.3 mg/dL — ABNORMAL LOW (ref 8.9–10.3)
Chloride: 111 mmol/L (ref 98–111)
Creatinine, Ser: 1.22 mg/dL — ABNORMAL HIGH (ref 0.44–1.00)
GFR calc Af Amer: 51 mL/min — ABNORMAL LOW (ref >60)
GFR calc non Af Amer: 44 mL/min — ABNORMAL LOW (ref >60)
Glucose, Bld: 102 mg/dL — ABNORMAL HIGH (ref 70–99)
Potassium: 4.4 mmol/L (ref 3.5–5.1)
Sodium: 139 mmol/L (ref 135–145)
Total Bilirubin: 0.6 mg/dL (ref 0.3–1.2)
Total Protein: 5 g/dL — ABNORMAL LOW (ref 6.5–8.1)

## 2018-12-31 LAB — CBC WITH DIFFERENTIAL/PLATELET
ABS IMMATURE GRANULOCYTES: 0.11 10*3/uL — AB (ref 0.00–0.07)
Basophils Absolute: 0 10*3/uL (ref 0.0–0.1)
Basophils Relative: 0 %
Eosinophils Absolute: 0.4 10*3/uL (ref 0.0–0.5)
Eosinophils Relative: 5 %
HCT: 32.5 % — ABNORMAL LOW (ref 36.0–46.0)
Hemoglobin: 10.4 g/dL — ABNORMAL LOW (ref 12.0–15.0)
Immature Granulocytes: 1 %
Lymphocytes Relative: 10 %
Lymphs Abs: 0.9 10*3/uL (ref 0.7–4.0)
MCH: 29.2 pg (ref 26.0–34.0)
MCHC: 32 g/dL (ref 30.0–36.0)
MCV: 91.3 fL (ref 80.0–100.0)
MONO ABS: 1.1 10*3/uL — AB (ref 0.1–1.0)
Monocytes Relative: 12 %
Neutro Abs: 6.6 10*3/uL (ref 1.7–7.7)
Neutrophils Relative %: 72 %
Platelets: 238 10*3/uL (ref 150–400)
RBC: 3.56 MIL/uL — ABNORMAL LOW (ref 3.87–5.11)
RDW: 13.5 % (ref 11.5–15.5)
WBC: 9.2 10*3/uL (ref 4.0–10.5)
nRBC: 0 % (ref 0.0–0.2)

## 2018-12-31 LAB — HEPARIN LEVEL (UNFRACTIONATED): Heparin Unfractionated: 0.57 IU/mL (ref 0.30–0.70)

## 2018-12-31 MED ORDER — LIDOCAINE HCL 1 % IJ SOLN
INTRAMUSCULAR | Status: AC
  Filled 2018-12-31: qty 20

## 2018-12-31 MED ORDER — MIDAZOLAM HCL 2 MG/2ML IJ SOLN
INTRAMUSCULAR | Status: AC | PRN
  Administered 2018-12-31 (×2): 1 mg via INTRAVENOUS

## 2018-12-31 MED ORDER — FLUMAZENIL 0.5 MG/5ML IV SOLN
INTRAVENOUS | Status: AC
  Filled 2018-12-31: qty 5

## 2018-12-31 MED ORDER — NALOXONE HCL 0.4 MG/ML IJ SOLN
INTRAMUSCULAR | Status: AC
  Filled 2018-12-31: qty 1

## 2018-12-31 MED ORDER — MIDAZOLAM HCL 2 MG/2ML IJ SOLN
INTRAMUSCULAR | Status: AC
  Filled 2018-12-31: qty 4

## 2018-12-31 MED ORDER — FENTANYL CITRATE (PF) 100 MCG/2ML IJ SOLN
INTRAMUSCULAR | Status: AC
  Filled 2018-12-31: qty 4

## 2018-12-31 MED ORDER — FENTANYL CITRATE (PF) 100 MCG/2ML IJ SOLN
INTRAMUSCULAR | Status: AC | PRN
  Administered 2018-12-31 (×2): 50 ug via INTRAVENOUS

## 2018-12-31 NOTE — Progress Notes (Addendum)
PROGRESS NOTE    Haley Palmer  FBP:[id number] DOB: [birth year] DOA: 12/27/2018 PCP: Patient, No Pcp Per  Brief Narrative:73 y.o.year old femalewith no significant medical history who presented on 12/29/2019with unintentional weight gain, enlarging abdomen, dyspnea on exertion for several months and new left leg swelling and pain over 2 days and was found to have ovarian mass with associated ascites and acute left leg DVT  Assessment & Plan:   Active Problems:   Acute DVT (deep venous thrombosis) (HCC)   Generalized abdominal mass   AKI (acute kidney injury) ([place])   #AcuteLeft leg DVT. DVT ultrasound confirmed acutethrombosis ofof left common femoral vein and left femoral veinas well as age indeterminate DVT of left Proximal profunda and left external iliac vein.  IVC filter placed by interventional radiology 12/31/2018.     #Ascites, concern for malignant, improving slightly. Status post 5 L removed by paracentesis on 12/30.Diagnostic ascitic fluid analysisnegative for SBP.  Abdomen is starting to become more distended.  We will schedule her for paracentesis for tomorrow.  #Ovarian mass.  GYN notes reviewed for eventual surgical management.  #Leukocytosis.  Resolved.   #AKI,improving. Seemsprerenal given diminished p.o. intakeand slow improvement with IVF. No obstruction on CT abdomen.     #Scattered pulmonary nodules throughout lungs bilaterally. Nonspecific, could be benign, will need follow-up imaging to ensure not metastatic disease    #Medical clearance patient has not had any kind of primary care follow-up or any doctor follow-up in her whole life.  She believes going to the doctor if something was wrong since she did not feel anything was wrong she never went to any doctors.  She denies being told her having had a heart attack or CAD or COPD.  She smoked for many years in the past.  She has no trouble climbing up to stairs prior to coming to the  hospital.  She has a 2 acre land which he mows by herself without chest pain shortness of breath or dyspnea on exertion.  The only time she needs to take rest is her leg starts hurting.  I did an echo on her yesterday which shows ejection fraction is 60 to 65% with normal wall motion with no regional wall motion abnormalities.  She has grade 1 diastolic dysfunction.  Cavity size was normal.  EKG is pending.  Chest x-ray 12/27/2018 no active disease.  She does not need any additional medical work-up prior to doing surgery.                  Estimated body mass index is 27.41 kg/m as calculated from the following:   Height as of this encounter: 5\' 7"  (1.702 m).   Weight as of this encounter: 79.4 kg.  DVT prophylaxis: Heparin Code Status: Full code Family Communication none Disposition Plan: Unknown  Consultants: GYN IR oncology   Procedures: Paracentesis 12/28/2018 IVC filter placement 12/31/2018 Antimicrobials:  None Subjective: Had multiple bowel movements which is kind of soft in the belly was constipated   Objective: Vitals:   12/31/18 1256 12/31/18 1302 12/31/18 1328 12/31/18 1402  BP: (!) 118/56 (!) 127/56 120/70 122/64  Pulse: 76 78 78 78  Resp:      Temp: 97.8 F (36.6 C) 97.8 F (36.6 C) 97.8 F (36.6 C) 97.9 F (36.6 C)  TempSrc: Oral Oral Oral Oral  SpO2: 97% 95% 98% 97%  Weight:      Height:        Intake/Output Summary (Last 24 hours) at 12/31/2018 1448  Last data filed at 12/31/2018 1403 Gross per 24 hour  Intake 2609.8 ml  Output 1550 ml  Net 1059.8 ml   Filed Weights   12/27/18 1050  Weight: 79.4 kg    Examination:  General exam: Appears calm and comfortable  Respiratory system: Clear to auscultation. Respiratory effort normal. Cardiovascular system: S1 & S2 heard, RRR. No JVD, murmurs, rubs, gallops or clicks. No pedal edema. Gastrointestinal system: Abdomen is  DISTENDED , soft and nontender. No organomegaly or masses felt. Normal bowel  sounds heard. Central nervous system: Alert and oriented. No focal neurological deficits. Extremities: Symmetric 5 x 5 power. Skin: No rashes, lesions or ulcers Psychiatry: Judgement and insight appear normal. Mood & affect appropriate.     Data Reviewed: I have personally reviewed following labs and imaging studies  CBC: Recent Labs  Lab 12/27/18 1108 12/28/18 0206 12/29/18 0417 12/30/18 0517 12/31/18 0541  WBC 13.0* 16.8* 13.2* 10.7* 9.2  NEUTROABS 11.2*  --   --   --  6.6  HGB 14.2 12.9 10.9* 10.5* 10.4*  HCT 44.6 40.4 33.5* 33.3* 32.5*  MCV 91.4 91.2 90.3 93.0 91.3  PLT 172 186 184 206 595   Basic Metabolic Panel: Recent Labs  Lab 12/27/18 1108 12/28/18 0206 12/29/18 0417 12/30/18 0517 12/31/18 0541  NA 138 137 137 138 139  K 4.7 4.7 4.3 4.4 4.4  CL 101 104 108 110 111  CO2 20* 18* 21* 20* 19*  GLUCOSE 133* 123* 111* 109* 102*  BUN 38* 49* 60* 50* 38*  CREATININE 1.87* 2.38* 2.11* 1.59* 1.22*  CALCIUM 10.1 9.2 8.8* 8.5* 8.3*   GFR: Estimated Creatinine Clearance: 44.5 mL/min (A) (by C-G formula based on SCr of 1.22 mg/dL (H)). Liver Function Tests: Recent Labs  Lab 12/27/18 1108 12/31/18 0541  AST 32 35  ALT 21 26  ALKPHOS 84 56  BILITOT 0.9 0.6  PROT 9.2* 5.0*  ALBUMIN 3.6 2.2*   Recent Labs  Lab 12/27/18 1108  LIPASE 34   No results for input(s): AMMONIA in the last 168 hours. Coagulation Profile: Recent Labs  Lab 12/27/18 1108  INR 1.22   Cardiac Enzymes: No results for input(s): CKTOTAL, CKMB, CKMBINDEX, TROPONINI in the last 168 hours. BNP (last 3 results) No results for input(s): PROBNP in the last 8760 hours. HbA1C: No results for input(s): HGBA1C in the last 72 hours. CBG: No results for input(s): GLUCAP in the last 168 hours. Lipid Profile: No results for input(s): CHOL, HDL, LDLCALC, TRIG, CHOLHDL, LDLDIRECT in the last 72 hours. Thyroid Function Tests: No results for input(s): TSH, T4TOTAL, FREET4, T3FREE, THYROIDAB in the  last 72 hours. Anemia Panel: No results for input(s): VITAMINB12, FOLATE, FERRITIN, TIBC, IRON, RETICCTPCT in the last 72 hours. Sepsis Labs: No results for input(s): PROCALCITON, LATICACIDVEN in the last 168 hours.  Recent Results (from the past 240 hour(s))  Culture, body fluid-bottle     Status: None (Preliminary result)   Collection Time: 12/28/18  3:05 PM  Result Value Ref Range Status   Specimen Description PERITONEAL  Final   Special Requests NONE  Final   Culture   Final    NO GROWTH 3 DAYS Performed at [facility], [address] [place], [place] [address]    Report Status PENDING  Incomplete  Gram stain     Status: None   Collection Time: 12/28/18  3:05 PM  Result Value Ref Range Status   Specimen Description PERITONEAL  Final   Special Requests NONE  Final   Gram Stain   Final    RARE WBC PRESENT, PREDOMINANTLY MONONUCLEAR NO ORGANISMS SEEN Performed at [facility], [address], [place], [place] [address]    Report Status 12/29/2018 FINAL  Final         Radiology Studies: Ir Ivc Filter Plmt / S&i /img Guid/mod Sed  Result Date: 12/31/2018 INDICATION: 73 year old with a large central abdominal and pelvic mass. EXAM: IVC FILTER PLACEMENT; IVC VENOGRAM; ULTRASOUND FOR VASCULAR ACCESS Physician: Stephan Minister. Anselm Pancoast, MD MEDICATIONS: None. ANESTHESIA/SEDATION: Fentanyl 100 mcg IV; Versed 2.0 mg IV Moderate Sedation Time:  26 minutes minutes The patient was continuously monitored during the procedure by the interventional radiology nurse under my direct supervision. CONTRAST:  Carbon dioxide FLUOROSCOPY TIME:  Fluoroscopy Time: 4 minutes 48 seconds (437 mGy). COMPLICATIONS: None immediate. PROCEDURE: The procedure was explained to the patient. The risks and benefits of the procedure were discussed and the patient's questions were addressed. Informed consent was obtained from the patient. Ultrasound demonstrated a patent right internal jugular vein. Ultrasound images  were obtained for documentation. The right side of the neck was prepped and draped in a sterile fashion. Maximal barrier sterile technique was utilized including caps, mask, sterile gowns, sterile gloves, sterile drape, hand hygiene and skin antiseptic. The skin was anesthetized with 1% lidocaine. A 21 gauge needle was directed into the vein with ultrasound guidance and a micropuncture dilator set was placed. A wire was advanced into the IVC. The filter sheath was advanced over the wire into the IVC. An IVC venogram was performed with carbon dioxide due to renal insufficiency. Fluoroscopic images were obtained for documentation. Renal veins were not confidently identified with carbon dioxide. Therefore, 5 French catheter and Bentson wire were used to cannulate the bilateral renal veins. A Bard Denali filter was deployed below the lowest renal vein. Left renal vein was again cannulated with a catheter and wire to ensure placement below the renal vein. Final spot radiograph images were obtained. Vascular sheath was removed with manual compression. Bandage placed over the puncture site. FINDINGS: IVC is small but patent. Bilateral renal veins were identified by cannulating the veins with a catheter and wire. The filter was deployed below the lowest renal vein. IMPRESSION: Successful placement of a retrievable IVC filter. PLAN: This IVC filter is potentially retrievable. The patient will be assessed for filter retrieval by Interventional Radiology in approximately 8-12 weeks. Further recommendations regarding filter retrieval, continued surveillance or declaration of device permanence, will be made at that time. Electronically Signed   By: Markus Daft M.D.   On: 12/31/2018 13:28        Scheduled Meds: . bisacodyl  10 mg Oral Daily  . fentaNYL      . lidocaine      . midazolam      . ondansetron (ZOFRAN) IV  4 mg Intravenous Once  . pneumococcal 23 valent vaccine  0.5 mL Intramuscular Tomorrow-1000    Continuous Infusions: . sodium chloride 100 mL/hr at 12/31/18 1028  . heparin 1,150 Units/hr (12/31/18 1237)     LOS: 4 days     Georgette Shell, MD [facility]  If 7PM-7AM, please contact night-coverage [email or web address] Password [facility] 12/31/2018, 2:48 PM

## 2018-12-31 NOTE — Procedures (Signed)
Interventional Radiology Procedure:   Indications:  Abdominopelvic mass with left leg DVT.  Needs filter prior to surgery  Procedure: IVC filter placement with carbon dioxide  Findings: Small IVC.  Renal veins identified by cannulating renal veins with catheter and wire. Denali filter placed below renal veins.   Complications: None     EBL: Minimal   Plan: bedrest 2 hours, may sit up.     Kaian Fahs R. Anselm Pancoast, MD  Pager: [phone]

## 2018-12-31 NOTE — Care Management Important Message (Signed)
Important Message  Patient Details  Name: Haley Palmer MRN: [id number] Date of Birth: Nov 20, 1946   Medicare Important Message Given:  Yes    Kerin Salen 12/31/2018, 9:59 AMImportant Message  Patient Details  Name: Haley Palmer MRN: [id number] Date of Birth: Jun 22, 1946   Medicare Important Message Given:  Yes    Kerin Salen 12/31/2018, 9:59 AM

## 2018-12-31 NOTE — Progress Notes (Signed)
PT Cancellation Note  Patient Details Name: Haley Palmer MRN: [id number] DOB: Apr 29, 1946   Cancelled Treatment:    Reason Eval/Treat Not Completed: Medical issues which prohibited therapy; pt on bedrest since IVC filter placement   [facility] 12/31/2018, 1:57 PM

## 2018-12-31 NOTE — Procedures (Signed)
Ultrasound-guided therapeutic paracentesis performed yielding 9.3 liters of slightly hazy, yellow fluid. No immediate complications. EBL <1 cc.

## 2018-12-31 NOTE — Progress Notes (Signed)
ANTICOAGULATION CONSULT NOTE -  Consult  Pharmacy Consult for IV heparin Indication: DVT  Allergies  Allergen Reactions  . Penicillins Rash    DID THE REACTION INVOLVE: Swelling of the face/tongue/throat, SOB, or low BP? No Sudden or severe rash/hives, skin peeling, or the inside of the mouth or nose? No Did it require medical treatment? No When did it last happen?childhood allergy If all above answers are "NO", may proceed with cephalosporin use.    Patient Measurements: Height: 5\' 7"  (170.2 cm) Weight: 175 lb (79.4 kg) IBW/kg (Calculated) : 61.6 Heparin Dosing Weight: 77  Vital Signs: Temp: 97.5 F (36.4 C) (01/02 0544) Temp Source: Oral (01/02 0544) BP: 109/91 (01/02 0544) Pulse Rate: 75 (01/02 0544)  Labs: Recent Labs    12/29/18 0417 12/29/18 1315 12/30/18 0517 12/31/18 0541  HGB 10.9*  --  10.5* 10.4*  HCT 33.5*  --  33.3* 32.5*  PLT 184  --  206 238  HEPARINUNFRC 0.69 0.54 0.49 0.57  CREATININE 2.11*  --  1.59* 1.22*    Estimated Creatinine Clearance: 44.5 mL/min (A) (by C-G formula based on SCr of 1.22 mg/dL (H)).   Medical History: History reviewed. No pertinent past medical history.  Assessment: 73 yo female with acute extensive left DVT to start IV heparin per Rx dosing. Baseline labs drawn.  GYN-ONC rec IR for  Paracentesis and cytology of ascites.  12/31/2018 Heparin level therapeutic at 0.57 at 1150 units/hr. Hg 14.2>12.9>10.5>10.4. PLTC WNL, s/p paracentesis 12/30 > 5L Plan for IVC filter today. SCr improved to 1.22  Goal of Therapy:  Heparin level 0.3-0.7 units/ml Monitor platelets by anticoagulation protocol: Yes   Plan:   Continue Heparin at 1150 units/hr  daily Heparin level and daily CBC Plan for IVC filter today by IR ? Transition to different agent for dc>LMWH vs NOAC?  Eudelia Bunch, Pharm.D [phone] 12/31/2018 7:11 AM

## 2018-12-31 NOTE — Progress Notes (Signed)
1528/1528-01  History reviewed. No pertinent past medical history.  HPI: She presented to the ED with increasing abdominal girth and LLE swelling for several months. She has associated DOE and decreased appetite. She endorses difficulty with AOD. She denies N/V, early satiety, bloating, weight loss, C/P.   Labs are suggestive of renal insufficiency--possible AKI/prerenal.  A CT scan showed a large abdominal/pelvic mass, 29 cm--felt to originate possibly from the right adnexa with large volume ascites, no lymphadenopathy.  Multiple pulmonary nodules were noted and were not felt to represent metastatic disease.  Duplex doppler study of the LE revealed a LLE DVT.  There is a family h/o colon and breast malignancies.  Has had no GYN care since birth of her last child. Denies bleeding.  Subjective: 12/28/18 - states feels much better post-paracentesis. Was able to eat more tonight for dinner. Has had no appetite lately. Has been eating small amounts only to avoid fullness. Intermittent episodes of emesis but with small meals she was able to control. 12/29/18 - eating more. Still feels relief post paracentesis. Having BM and flatus. No nausea. 12/31/18 - s/p IVC filter placement, c/o hunger. Has had 2 BM today and no N/V.  Objective: Vital signs in last 24 hours: Temp:  [97.5 F (36.4 C)-98 F (36.7 C)] 97.9 F (36.6 C) (01/02 1402) Pulse Rate:  [72-87] 78 (01/02 1402) Resp:  [10-22] 16 (01/02 1231) BP: (109-153)/(56-106) 119/65 (01/02 1715) SpO2:  [94 %-99 %] 97 % (01/02 1402) Last BM Date: 12/30/18  Intake/Output from previous day: 01/01 0701 - 01/02 0700 In: 2999.7 [P.O.:360; I.V.:2639.7] Out: 1475 [Urine:1475] Patient Vitals for the past 24 hrs:  Urine Occurrence Stool Descriptors  12/31/18 0822 - Brown  12/31/18 0544 0 -  12/31/18 0423 0 -  12/30/18 2238 0 -  12/30/18 2130 - Brown     Physical Examination: General: alert, cooperative and no distress GI: soft, distended  with mass. NT Extremities: edema left > right  Labs: WBC/Hgb/Hct/Plts:  9.2/10.4/32.5/238 (01/02 0541) BUN/Cr/glu/ALT/AST/amyl/lip:  38/1.22/--/26/35/--/-- (01/02 0541)  CBC Latest Ref Rng & Units 12/31/2018 12/30/2018 12/29/2018  WBC 4.0 - 10.5 K/uL 9.2 10.7(H) 13.2(H)  Hemoglobin 12.0 - 15.0 g/dL 10.4(L) 10.5(L) 10.9(L)  Hematocrit 36.0 - 46.0 % 32.5(L) 33.3(L) 33.5(L)  Platelets 150 - 400 K/uL 238 206 184   BMP Latest Ref Rng & Units 12/31/2018 12/30/2018 12/29/2018  Glucose 70 - 99 mg/dL 102(H) 109(H) 111(H)  BUN 8 - 23 mg/dL 38(H) 50(H) 60(H)  Creatinine 0.44 - 1.00 mg/dL 1.22(H) 1.59(H) 2.11(H)  Sodium 135 - 145 mmol/L 139 138 137  Potassium 3.5 - 5.1 mmol/L 4.4 4.4 4.3  Chloride 98 - 111 mmol/L 111 110 108  CO2 22 - 32 mmol/L 19(L) 20(L) 21(L)  Calcium 8.9 - 10.3 mg/dL 8.3(L) 8.5(L) 8.8(L)      Assessment/Plan:  73 y.o. s/p : stable  Pelvic mass - Ascitic fluid cytology with adenoca. No immunostains to clarify further.   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 CEA is normal Will ultimately need laparotomy to resect and we'll need medical clearance prior IR has placed an IVC filter Consider repeat CTAP after discussing with radiology, now that creatinine improved and perhaps ascites has lessened post paracentesis Looking at our group's surgery schedule availability will not be until the week of 01/11/19  Pulmonary - lesions felt not to be c/w metastatic disease. Plan would be to monitor closely.  AKI - per primary team  ADL - She is cachectic  appearing and I suspect malnourished, however albumin WNL. Prealbumin is low. I worry she is deconditioned and may do poorly with any large upfront debulking, however imaging does not show obvious extraovarian disease. (consider repeating imaging with contrast since radiology was unable to adequately assess for peritoneal disease with the ascites and non-contrast imaging) Plan for surgery would be  mass excision in hopes this will improve her performance status prior to likely chemo  DVT - per primary team    LOS: 4 days    Isabel Caprice 12/31/2018, 5:41 PM

## 2019-01-01 ENCOUNTER — Encounter (HOSPITAL_COMMUNITY): Payer: Self-pay | Admitting: Hematology and Oncology

## 2019-01-01 ENCOUNTER — Other Ambulatory Visit: Payer: Self-pay | Admitting: Hematology and Oncology

## 2019-01-01 DIAGNOSIS — E860 Dehydration: Secondary | ICD-10-CM

## 2019-01-01 DIAGNOSIS — E43 Unspecified severe protein-calorie malnutrition: Secondary | ICD-10-CM

## 2019-01-01 DIAGNOSIS — C569 Malignant neoplasm of unspecified ovary: Secondary | ICD-10-CM

## 2019-01-01 DIAGNOSIS — D638 Anemia in other chronic diseases classified elsewhere: Secondary | ICD-10-CM

## 2019-01-01 LAB — CBC
HCT: 33.6 % — ABNORMAL LOW (ref 36.0–46.0)
Hemoglobin: 10.5 g/dL — ABNORMAL LOW (ref 12.0–15.0)
MCH: 29 pg (ref 26.0–34.0)
MCHC: 31.3 g/dL (ref 30.0–36.0)
MCV: 92.8 fL (ref 80.0–100.0)
Platelets: 144 10*3/uL — ABNORMAL LOW (ref 150–400)
RBC: 3.62 MIL/uL — ABNORMAL LOW (ref 3.87–5.11)
RDW: 13.6 % (ref 11.5–15.5)
WBC: 11.2 10*3/uL — ABNORMAL HIGH (ref 4.0–10.5)
nRBC: 0 % (ref 0.0–0.2)

## 2019-01-01 MED ORDER — DEXAMETHASONE 4 MG PO TABS
20.0000 mg | ORAL_TABLET | Freq: Once | ORAL | Status: AC
  Administered 2019-01-03: 20 mg via ORAL
  Filled 2019-01-01: qty 5

## 2019-01-01 MED ORDER — BISACODYL 10 MG RE SUPP: 10.0000 mg | Freq: Once | RECTAL | Status: DC

## 2019-01-01 MED ORDER — ENOXAPARIN SODIUM 80 MG/0.8ML ~~LOC~~ SOLN
1.0000 mg/kg | Freq: Two times a day (BID) | SUBCUTANEOUS | Status: DC
  Administered 2019-01-01 – 2019-01-08 (×15): 80 mg via SUBCUTANEOUS
  Filled 2019-01-01 (×15): qty 0.8

## 2019-01-01 MED ORDER — DEXAMETHASONE 4 MG PO TABS
20.0000 mg | ORAL_TABLET | Freq: Once | ORAL | Status: AC
  Administered 2019-01-04: 20 mg via ORAL
  Filled 2019-01-01: qty 5

## 2019-01-01 NOTE — Progress Notes (Signed)
ANTICOAGULATION CONSULT NOTE -  Consult  Pharmacy Consult for LMWH Indication: DVT  Allergies  Allergen Reactions  . Penicillins Rash    DID THE REACTION INVOLVE: Swelling of the face/tongue/throat, SOB, or low BP? No Sudden or severe rash/hives, skin peeling, or the inside of the mouth or nose? No Did it require medical treatment? No When did it last happen?childhood allergy If all above answers are "NO", may proceed with cephalosporin use.    Patient Measurements: Height: 5\' 7"  (170.2 cm) Weight: 175 lb (79.4 kg) IBW/kg (Calculated) : 61.6 Heparin Dosing Weight: 77  Vital Signs: Temp: 98.4 F (36.9 C) (01/03 0542) Temp Source: Oral (01/03 0542) BP: 124/68 (01/03 0542) Pulse Rate: 87 (01/03 0542)  Labs: Recent Labs    12/29/18 1315  12/30/18 0517 12/31/18 0541 01/01/19 0506  HGB  --    < > 10.5* 10.4* 10.5*  HCT  --   --  33.3* 32.5* 33.6*  PLT  --   --  206 238 144*  HEPARINUNFRC 0.54  --  0.49 0.57  --   CREATININE  --   --  1.59* 1.22*  --    < > = values in this interval not displayed.    Estimated Creatinine Clearance: 44.5 mL/min (A) (by C-G formula based on SCr of 1.22 mg/dL (H)).   Medical History: History reviewed. No pertinent past medical history.  Assessment: 73 yo female with acute extensive left DVT to start IV heparin per Rx dosing. Baseline labs drawn.  GYN-ONC rec IR for  Paracentesis and cytology of ascites.  01/01/2019 s/p IVC filter yesterday. Heparin stopped yesterday.  SCr improved to 1.22 To start full dose LMWH for acute DVT. Hg 10.5 - stable, PLTC 144 - down from 238.  No bleeding reported.   Goal of Therapy:  Heparin level 0.3-0.7 units/ml Monitor platelets by anticoagulation protocol: Yes   Plan:  LMWH 1 mg/kg sq q12  = 80 q12 Pharmacy to sign off  Eudelia Bunch, Pharm.D [phone] 01/01/2019 8:48 AM

## 2019-01-01 NOTE — Progress Notes (Signed)
START ON PATHWAY REGIMEN - Ovarian     A cycle is every 21 days:     Paclitaxel      Carboplatin   **Always confirm dose/schedule in your pharmacy ordering system**  Patient Characteristics: Preoperative or Nonsurgical Candidate (Clinical Staging), Newly Diagnosed, Neoadjuvant Therapy Indicated Therapeutic Status: Preoperative or Nonsurgical Candidate (Clinical Staging) BRCA Mutation Status: Quantity Not Sufficient AJCC T Category: cT3 AJCC 8 Stage Grouping: Unknown AJCC N Category: cN0 AJCC M Category: cM0 Therapy Plan: Neoadjuvant Therapy Indicated Intent of Therapy: Curative Intent, Discussed with Patient

## 2019-01-01 NOTE — Progress Notes (Signed)
PROGRESS NOTE    Haley Palmer  PYP:[id number] DOB: Sep 18, 1946 DOA: 12/27/2018 PCP: Patient, No Pcp Per  Brief Narrative73 y.o.year old femalewith no significant medical history who presented on 12/29/2019with unintentional weight gain, enlarging abdomen, dyspnea on exertion for several months and new left leg swelling and pain over 2 days and was found to have ovarian mass with associated ascites and acute left leg DVT   Assessment & Plan:   Active Problems:   Acute DVT (deep venous thrombosis) (HCC)   Generalized abdominal mass   AKI (acute kidney injury) ([place])   #AcuteLeft leg DVT. DVT ultrasound confirmed acutethrombosis ofof left common femoral vein and left femoral veinas well as age indeterminate DVT of left Proximal profunda and left external iliac vein.  IVC filter placed by interventional radiology 12/31/2018.  Continue Lovenox for treatment of DVT.  Patient to have inpatient chemotherapy started by Dr. Lurlean Leyden.  Discussed with Dr. Harrington Challenger.   Monitor platelet count and hemoglobin.  #Ascites, concern for malignant, improving slightly. Status post 5 L removed by paracentesis on 12/30 and another 9.3 L removed 12/31/2018.dgnostic ascitic fluid analysisnegative for SBP.  #Ovarian mass.GYN notes reviewed for eventual surgical management.  #Leukocytosis.  Resolved.   #AKI,improving. Seemsprerenal given diminished p.o. intakeand slow improvement with IVF. No obstruction on CT abdomen.   #Scattered pulmonary nodules throughout lungs bilaterally. Nonspecific, could be benign, will need follow-up imaging to ensure not metastatic disease  Constipation we will try stool softeners and laxatives.  Estimated body mass index is 27.41 kg/m as calculated from the following:   Height as of this encounter: 5\' 7"  (1.702 m).   Weight as of this encounter: 79.4 kg.  DVT prophylaxis: Lovenox  code Status: Full code Family Communication: Her only son is in [place] who  is not able to visit her at this time Disposition Plan Unknown Consultants: GYN oncology and eventual radiology   Procedures: Paracentesis x2 IVC filter placement Antimicrobials: None Subjective: Feels better but has not had a bowel movement for 2 days  Objective: Vitals:   12/31/18 1710 12/31/18 1715 12/31/18 2107 01/01/19 0542  BP: 127/65 119/65 (!) 130/56 124/68  Pulse:   83 87  Resp:   18 18  Temp:   98 F (36.7 C) 98.4 F (36.9 C)  TempSrc:   Oral Oral  SpO2:   98% 98%  Weight:      Height:        Intake/Output Summary (Last 24 hours) at 01/01/2019 1126 Last data filed at 01/01/2019 0956 Gross per 24 hour  Intake 838.07 ml  Output 1125 ml  Net -286.93 ml   Filed Weights   12/27/18 1050  Weight: 79.4 kg    Examination:  General exam: Appears calm and comfortable  Respiratory system: Clear to auscultation. Respiratory effort normal. Cardiovascular system: S1 & S2 heard, RRR. No JVD, murmurs, rubs, gallops or clicks. No pedal edema. Gastrointestinal system: Abdomen is DISTENDED soft and nontender. No organomegaly or masses felt. Normal bowel sounds heard. Central nervous system: Alert and oriented. No focal neurological deficits. Extremities: 3+ edema to the left lower extremity Skin: No rashes, lesions or ulcers Psychiatry: Judgement and insight appear normal. Mood & affect appropriate.     Data Reviewed: I have personally reviewed following labs and imaging studies  CBC: Recent Labs  Lab 12/27/18 1108 12/28/18 0206 12/29/18 0417 12/30/18 0517 12/31/18 0541 01/01/19 0506  WBC 13.0* 16.8* 13.2* 10.7* 9.2 11.2*  NEUTROABS 11.2*  --   --   --  6.6  --   HGB 14.2 12.9 10.9* 10.5* 10.4* 10.5*  HCT 44.6 40.4 33.5* 33.3* 32.5* 33.6*  MCV 91.4 91.2 90.3 93.0 91.3 92.8  PLT 172 186 184 206 238 782*   Basic Metabolic Panel: Recent Labs  Lab 12/27/18 1108 12/28/18 0206 12/29/18 0417 12/30/18 0517 12/31/18 0541  NA 138 137 137 138 139  K 4.7 4.7 4.3  4.4 4.4  CL 101 104 108 110 111  CO2 20* 18* 21* 20* 19*  GLUCOSE 133* 123* 111* 109* 102*  BUN 38* 49* 60* 50* 38*  CREATININE 1.87* 2.38* 2.11* 1.59* 1.22*  CALCIUM 10.1 9.2 8.8* 8.5* 8.3*   GFR: Estimated Creatinine Clearance: 44.5 mL/min (A) (by C-G formula based on SCr of 1.22 mg/dL (H)). Liver Function Tests: Recent Labs  Lab 12/27/18 1108 12/31/18 0541  AST 32 35  ALT 21 26  ALKPHOS 84 56  BILITOT 0.9 0.6  PROT 9.2* 5.0*  ALBUMIN 3.6 2.2*   Recent Labs  Lab 12/27/18 1108  LIPASE 34   No results for input(s): AMMONIA in the last 168 hours. Coagulation Profile: Recent Labs  Lab 12/27/18 1108  INR 1.22   Cardiac Enzymes: No results for input(s): CKTOTAL, CKMB, CKMBINDEX, TROPONINI in the last 168 hours. BNP (last 3 results) No results for input(s): PROBNP in the last 8760 hours. HbA1C: No results for input(s): HGBA1C in the last 72 hours. CBG: No results for input(s): GLUCAP in the last 168 hours. Lipid Profile: No results for input(s): CHOL, HDL, LDLCALC, TRIG, CHOLHDL, LDLDIRECT in the last 72 hours. Thyroid Function Tests: No results for input(s): TSH, T4TOTAL, FREET4, T3FREE, THYROIDAB in the last 72 hours. Anemia Panel: No results for input(s): VITAMINB12, FOLATE, FERRITIN, TIBC, IRON, RETICCTPCT in the last 72 hours. Sepsis Labs: No results for input(s): PROCALCITON, LATICACIDVEN in the last 168 hours.  Recent Results (from the past 240 hour(s))  Culture, body fluid-bottle     Status: None (Preliminary result)   Collection Time: 12/28/18  3:05 PM  Result Value Ref Range Status   Specimen Description PERITONEAL  Final   Special Requests NONE  Final   Culture   Final    NO GROWTH 4 DAYS Performed at [facility], [address] [place], [place] [address]    Report Status PENDING  Incomplete  Gram stain     Status: None   Collection Time: 12/28/18  3:05 PM  Result Value Ref Range Status   Specimen Description PERITONEAL  Final   Special  Requests NONE  Final   Gram Stain   Final    RARE WBC PRESENT, PREDOMINANTLY MONONUCLEAR NO ORGANISMS SEEN Performed at [facility], [address] [place] [address]    Report Status 12/29/2018 FINAL  Final         Radiology Studies: Ir Ivc Filter Plmt / S&i /img Guid/mod Sed  Result Date: 12/31/2018 INDICATION: 73 year old with a large central abdominal and pelvic mass. EXAM: IVC FILTER PLACEMENT; IVC VENOGRAM; ULTRASOUND FOR VASCULAR ACCESS Physician: Stephan Minister. Anselm Pancoast, MD MEDICATIONS: None. ANESTHESIA/SEDATION: Fentanyl 100 mcg IV; Versed 2.0 mg IV Moderate Sedation Time:  26 minutes minutes The patient was continuously monitored during the procedure by the interventional radiology nurse under my direct supervision. CONTRAST:  Carbon dioxide FLUOROSCOPY TIME:  Fluoroscopy Time: 4 minutes 48 seconds (437 mGy). COMPLICATIONS: None immediate. PROCEDURE: The procedure was explained to the patient. The risks and benefits of the procedure were discussed and the patient's questions were  addressed. Informed consent was obtained from the patient. Ultrasound demonstrated a patent right internal jugular vein. Ultrasound images were obtained for documentation. The right side of the neck was prepped and draped in a sterile fashion. Maximal barrier sterile technique was utilized including caps, mask, sterile gowns, sterile gloves, sterile drape, hand hygiene and skin antiseptic. The skin was anesthetized with 1% lidocaine. A 21 gauge needle was directed into the vein with ultrasound guidance and a micropuncture dilator set was placed. A wire was advanced into the IVC. The filter sheath was advanced over the wire into the IVC. An IVC venogram was performed with carbon dioxide due to renal insufficiency. Fluoroscopic images were obtained for documentation. Renal veins were not confidently identified with carbon dioxide. Therefore, 5 French catheter and Bentson wire were used to cannulate the bilateral  renal veins. A Bard Denali filter was deployed below the lowest renal vein. Left renal vein was again cannulated with a catheter and wire to ensure placement below the renal vein. Final spot radiograph images were obtained. Vascular sheath was removed with manual compression. Bandage placed over the puncture site. FINDINGS: IVC is small but patent. Bilateral renal veins were identified by cannulating the veins with a catheter and wire. The filter was deployed below the lowest renal vein. IMPRESSION: Successful placement of a retrievable IVC filter. PLAN: This IVC filter is potentially retrievable. The patient will be assessed for filter retrieval by Interventional Radiology in approximately 8-12 weeks. Further recommendations regarding filter retrieval, continued surveillance or declaration of device permanence, will be made at that time. Electronically Signed   By: Markus Daft M.D.   On: 12/31/2018 13:28   US Paracentesis  Result Date: 01/01/2019 INDICATION: Patient with history of metastatic adenocarcinoma, abdominopelvic mass, recurrent malignant ascites. Request made for therapeutic paracentesis. EXAM: ULTRASOUND GUIDED THERAPEUTIC PARACENTESIS MEDICATIONS: None COMPLICATIONS: None immediate. PROCEDURE: Informed written consent was obtained from the patient after a discussion of the risks, benefits and alternatives to treatment. A timeout was performed prior to the initiation of the procedure. Initial ultrasound scanning demonstrates a large amount of ascites within the left lower abdominal quadrant. The left lower abdomen was prepped and draped in the usual sterile fashion. 1% lidocaine was used for local anesthesia. Following this, a 19 gauge, 10-cm, Yueh catheter was introduced. An ultrasound image was saved for documentation purposes. The paracentesis was performed. The catheter was removed and a dressing was applied. The patient tolerated the procedure well without immediate post procedural complication.  FINDINGS: A total of approximately 9.3 liters of slightly hazy, yellow fluid was removed. IMPRESSION: Successful ultrasound-guided therapeutic paracentesis yielding 9.3 liters of peritoneal fluid. Read by: Rowe Robert, PA-C Electronically Signed   By: Markus Daft M.D.   On: 12/31/2018 17:38        Scheduled Meds: . bisacodyl  10 mg Oral Daily  . enoxaparin (LOVENOX) injection  1 mg/kg Subcutaneous Q12H  . ondansetron (ZOFRAN) IV  4 mg Intravenous Once  . pneumococcal 23 valent vaccine  0.5 mL Intramuscular Tomorrow-1000   Continuous Infusions:   LOS: 5 days    Georgette Shell, MD [facility]  If 7PM-7AM, please contact night-coverage [email or web address] Password [email or web address] 01/01/2019, 11:26 AM

## 2019-01-01 NOTE — Progress Notes (Signed)
Physical Therapy Treatment Patient Details Name: Haley Palmer MRN: [id number] DOB: 1946-10-07 Today's Date: 01/01/2019    History of Present Illness 73 year old female was admitted for abdominal and LLE swelling and pain.  Found to have LLE DVT and ovarian mass. s/p IVC filter 1/2    PT Comments    Progressing slowly with mobility. Mobility is limited by edema and pain in L LE from DVT. Pt participated well. Continue to recommend ST rehab at [facility]. Recommend daily OOB to chair with nursing staff.    Follow Up Recommendations  SNF     Equipment Recommendations  (TBD at next venue)    Recommendations for Other Services       Precautions / Restrictions Precautions Precautions: Fall Restrictions Weight Bearing Restrictions: No    Mobility  Bed Mobility Overal bed mobility: Needs Assistance Bed Mobility: Supine to Sit   Sidelying to sit: Mod assist;+2 for physical assistance;+2 for safety/equipment;HOB elevated       General bed mobility comments: Assist for trunk and L LE. Assist to scoot to EOB  Transfers Overall transfer level: Needs assistance Equipment used: Rolling walker (2 wheeled) Transfers: Sit to/from Stand Sit to Stand: Mod assist;+2 physical assistance;+2 safety/equipment;From elevated surface Stand pivot transfers: Min assist;+2 safety/equipment;+2 physical assistance       General transfer comment: VCs safety, technique, hand placement. Assist to rise, stabilize, control descent. Mild posterior bias.   Ambulation/Gait Ambulation/Gait assistance: Min assist;+2 physical assistance;+2 safety/equipment Gait Distance (Feet): 6 Feet Assistive device: Rolling walker (2 wheeled) Gait Pattern/deviations: Step-to pattern;Antalgic;Trunk flexed     General Gait Details: VCs safety, technique, sequence. Slow, effortful gait pattern. Followed closely with recliner. Mild posterior bias.    Stairs             Wheelchair Mobility    Modified Rankin  (Stroke Patients Only)       Balance Overall balance assessment: Needs assistance         Standing balance support: Bilateral upper extremity supported Standing balance-Leahy Scale: Poor                              Cognition Arousal/Alertness: Awake/alert Behavior During Therapy: WFL for tasks assessed/performed Overall Cognitive Status: Within Functional Limits for tasks assessed                                        Exercises      General Comments        Pertinent Vitals/Pain Pain Assessment: Faces Faces Pain Scale: Hurts little more Pain Location: L LE Pain Descriptors / Indicators: Discomfort;Sore;Heaviness;Tightness Pain Intervention(s): Monitored during session;Repositioned    Home Living                      Prior Function            PT Goals (current goals can now be found in the care plan section) Progress towards PT goals: Progressing toward goals    Frequency    Min 2X/week      PT Plan Current plan remains appropriate    Co-evaluation              AM-PAC PT "6 Clicks" Mobility   Outcome Measure  Help needed turning from your back to your side while in a flat bed without using bedrails?: A Lot  Help needed moving from lying on your back to sitting on the side of a flat bed without using bedrails?: A Lot Help needed moving to and from a bed to a chair (including a wheelchair)?: A Lot Help needed standing up from a chair using your arms (e.g., wheelchair or bedside chair)?: A Lot Help needed to walk in hospital room?: A Lot Help needed climbing 3-5 steps with a railing? : Total 6 Click Score: 11    End of Session   Activity Tolerance: Patient limited by fatigue;Patient limited by pain Patient left: in chair;with call bell/phone within reach   PT Visit Diagnosis: Muscle weakness (generalized) (M62.81);Difficulty in walking, not elsewhere classified (R26.2);Pain;Unsteadiness on feet  (R26.81) Pain - Right/Left: Left Pain - part of body: Leg     Time: 3818-2993 PT Time Calculation (min) (ACUTE ONLY): 14 min  Charges:  $Gait Training: 8-22 mins                        Weston Anna, [place] Pager: [phone] Office: [phone]  '

## 2019-01-01 NOTE — Progress Notes (Signed)
1528/1528-01  History reviewed. No pertinent past medical history.   Assessment/Plan:  73 y.o. s/p : stable  Pelvic mass - Ascitic fluid cytology with adenoca. 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 CEA is normal Recommendation is for neoadjuvant chemotherapy with carboplatin and paclitaxel for at least 3 cycles then reassessment for possible surgical debulking at that time. Recommend sequencing therapy this way because she has large volume ascites that will likely continue to accumulate until chemotherapy started and surgery will delay this. Additionally her poor performance status places her at particularly high risk for morbidity associated with surgery at this time. Additionally she is at very high risk for propagation of her clot or new VTE if anticoagulation is stopper perioperatively (which would be necessary) in this acute VTE setting. I explained that Haley filter does not prevent clot, but instead prevents passage of large clot to Haley lung.   I have coordinated care with Dr Heath Lark who will see and evaluate [facility] patient for chemotherapy, and will potentially treat as an inpatient . After that point in time she can be considered for disposition (eg to SNF or nursing home or rehab).  We will follow-up as an outpatient after 3 cycles of chemotherapy.   Pulmonary - lesions concerning for metastatic disease. Plan would be to monitor closely and their response to chemotherapy.  AKI - per primary team, continue hydration and avoidance of nephrotoxic agents. I would recommend foley removal as soon as reasonably possible.   ADL - She is cachectic appearing and I suspect malnourished, however albumin WNL. Prealbumin is low. DVT - per primary team. Recommend continued therapeutic lovenox.    HPI: She presented to [facility] ED with increasing abdominal girth and LLE swelling for several months. She has associated DOE and  decreased appetite. She endorses difficulty with AOD. She denies N/V, early satiety, bloating, weight loss, C/P.   Labs are suggestive of renal insufficiency--possible AKI/prerenal.  A CT scan showed a large abdominal/pelvic mass, 29 cm--felt to originate possibly from Haley right adnexa with large volume ascites, no lymphadenopathy.  Multiple pulmonary nodules were noted and were not felt to represent metastatic disease.  Duplex doppler study of Haley LE revealed a LLE DVT.  There is a family h/o colon and breast malignancies.  Has had no GYN care since birth of her last child. Denies bleeding.  Subjective: 12/28/18 - states feels much better post-paracentesis. Was able to eat more tonight for dinner. Has had no appetite lately. Has been eating small amounts only to avoid fullness. Intermittent episodes of emesis but with small meals she was able to control. 12/29/18 - eating more. Still feels relief post paracentesis. Having BM and flatus. No nausea. 12/31/18 - s/p IVC filter placement, c/o hunger. Has had 2 BM today and no N/V.  Objective: Vital signs in last 24 hours: Temp:  [97.8 F (36.6 C)-98.4 F (36.9 C)] 98.4 F (36.9 C) (01/03 0542) Pulse Rate:  [76-87] 87 (01/03 0542) Resp:  [18] 18 (01/03 0542) BP: (118-145)/(56-106) 124/68 (01/03 0542) SpO2:  [95 %-98 %] 98 % (01/03 0542) Last BM Date: 12/30/18  Intake/Output from previous day: 01/02 0701 - 01/03 0700 In: 1164 [P.O.:120; I.V.:1044] Out: 6599 [Urine:1375] No data found.   Physical Examination: General: alert, cooperative and no distress GI: soft, distended with mass. NT Extremities: edema left > right  Labs: WBC/Hgb/Hct/Plts:  11.2/10.5/33.6/144 (01/03 0506)    CBC Latest Ref Rng & Units 01/01/2019 12/31/2018  12/30/2018  WBC 4.0 - 10.5 K/uL 11.2(H) 9.2 10.7(H)  Hemoglobin 12.0 - 15.0 g/dL 10.5(L) 10.4(L) 10.5(L)  Hematocrit 36.0 - 46.0 % 33.6(L) 32.5(L) 33.3(L)  Platelets 150 - 400 K/uL 144(L) 238 206   BMP Latest Ref  Rng & Units 12/31/2018 12/30/2018 12/29/2018  Glucose 70 - 99 mg/dL 102(H) 109(H) 111(H)  BUN 8 - 23 mg/dL 38(H) 50(H) 60(H)  Creatinine 0.44 - 1.00 mg/dL 1.22(H) 1.59(H) 2.11(H)  Sodium 135 - 145 mmol/L 139 138 137  Potassium 3.5 - 5.1 mmol/L 4.4 4.4 4.3  Chloride 98 - 111 mmol/L 111 110 108  CO2 22 - 32 mmol/L 19(L) 20(L) 21(L)  Calcium 8.9 - 10.3 mg/dL 8.3(L) 8.5(L) 8.8(L)       LOS: 5 days    Thereasa Solo 01/01/2019, 12:48 PM
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
## 2019-01-01 NOTE — Progress Notes (Signed)
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 I have personally reviewed the imaging study, pathology report and discussed the plan of care with the patient. Her performance status is poor, without chemotherapy, it is unlikely to improve.  Her recurrent ascites, protein calorie malnutrition, early satiety and DVT have contributed to her poor performance status. In my experience, with neoadjuvant chemotherapy, two thirds the patient will improve to the point that they may be able to undergo interval debulking surgery. I think it might be helpful if pathologist can add additional immunostains to clarify the subtype of adenocarcinoma further. I discussed with the patient the risk, benefits, side effects of chemotherapy with neoadjuvant approach using combination chemotherapy with carboplatin and Taxol and she agreed to proceed with the plan of care. I will discussed with the hospitalist group to get the patient moved to 6 E. in anticipation to start chemotherapy on January 04, 2019. I will order premedication dexamethasone on January 03, 2019 and early morning January 04, 2019  Left lower extremity DVT status post IVC filter placement She will continue treatment with Lovenox  Acute renal failure secondary to dehydration, improved with fluid resuscitation She will continue hydration as tolerated  Moderate to severe protein calorie malnutrition She needs nutritional consult  Anemia chronic illness She does not need further work-up or transfusion support.  This is likely due to anemia chronic illness  Significant debility with poor overall performance status Appreciate PT evaluation and skilled rehab facility placement for future  discharge  Discharge planning Hopefully next week after chemotherapy is completed  All questions were answered. The patient knows to call the clinic with any problems, questions or concerns.  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 female is here because of recent diagnosis of malignant ascites, most likely due to ovarian cancer, on background history of acute DVT and acute renal failure I have reviewed her chart and materials related to her cancer extensively and collaborated history with the patient.  The patient lives alone.  She has 1 son. She has complaint of abdominal bloating, abdominal swelling, changes in bowel habits, early satiety for many months.  She developed acute left lower extremity swelling and presented to the emergency department for evaluation.  She underwent extensive work-up and was subsequently found to have malignant ascites, most likely due to untreated ovarian cancer.  Summary of oncologic history is as follows:   Malignant neoplasm of ovary ([facility])   12/27/2018 Imaging    Venous Doppler US lower extremity: Right: No evidence of common femoral vein obstruction. Left: Findings consistent with acute deep vein thrombosis involving the left common femoral vein, and left femoral vein. Findings consistent with acute superficial vein thrombosis involving the left great saphenous vein. Findings consistent with age indeterminate deep vein thrombosis involving the left proximal profunda vein, left popliteal vein, left posterior tibial vein, and left peroneal vein. Left external iliac vein appears thrombosied. IVC was not viualized. No cystic structure found in the popliteal fossa.    12/27/2018 Imaging    Ct scan of chest, abdomen and pelvis showed: 1. 28.7 x 25.6 x 25.6 cm mass in the central abdomen and  pelvis, strongly  favored to be of ovarian origin, highly concerning for ovarian neoplasm. This is associated with a very large volume of (presumably malignant) ascites. 2. Multiple tiny 1-3 mm pulmonary nodules scattered throughout the lungs bilaterally. These are nonspecific, but favored to represent benign areas of mucoid impaction within terminal bronchioles. However, close attention on follow-up studies is recommended to ensure the stability or resolution of this finding, as the possibility of metastatic disease is not excluded. 3. Aortic atherosclerosis, in addition to left anterior descending coronary artery disease. Assessment for potential risk factor modification, dietary therapy or pharmacologic therapy may be warranted, if clinically indicated.     12/27/2018 Tumor Marker    Patient's tumor was tested for the following markers: CA-125. Results of the tumor marker test revealed 40.2.    12/28/2018 Pathology Results    PERITONEAL/ASCITIC FLUID (SPECIMEN 1 OF 1 COLLECTED 12/28/18): NON-SMALL CELL CARCINOMA, FAVOR ADENOCARCINOMA.    12/28/2018 Procedure    Successful ultrasound-guided diagnostic and therapeutic paracentesis yielding 5 liters of peritoneal fluid.    12/30/2018 Echocardiogram    Left ventricle: The cavity size was normal. Wall thickness was normal. Systolic function was normal. The estimated ejection fraction was in the range of 60% to 65%. Wall motion was normal; there were no regional wall motion abnormalities. Doppler parameters are consistent with abnormal left ventricular relaxation (grade 1 diastolic dysfunction).    12/31/2018 Procedure    Successful ultrasound-guided therapeutic paracentesis yielding 9.3 liters of peritoneal fluid.     12/31/2018 Procedure    This IVC filter is potentially retrievable. The patient will be assessed for filter retrieval by Interventional Radiology in approximately 8-12 weeks. Further recommendations regarding filter retrieval, continued surveillance  or declaration of device permanence, will be made at that time.     She felt better since she has therapeutic paracentesis.  She still complains of lower extremity pain and swelling.  She has difficulty moving around.  She denies nausea. She denies cough, chest pain or shortness of breath. She has quit smoking since admission. The patient denies any recent signs or symptoms of bleeding such as spontaneous epistaxis, hematuria or hematochezia.   MEDICAL HISTORY:  Past Medical History:  Diagnosis Date  . Ovarian cancer [facility])     SURGICAL HISTORY: Past Surgical History:  Procedure Laterality Date  . CESAREAN SECTION    . Cyst removal on Left Wrist Left   . IR IVC FILTER PLMT / S&I /IMG GUID/MOD SED  12/31/2018  . Surgery on Right Wrist Right 2006  . TONSILLECTOMY      SOCIAL HISTORY: Social History   Socioeconomic History  . Marital status: Single    Spouse name: Not on file  . Number of children: 1  . Years of education: Not on file  . Highest education level: Not on file  Occupational History  . Occupation: disabled  Social Needs  . Financial resource strain: Not on file  . Food insecurity:    Worry: Not on file    Inability: Not on file  . Transportation needs:    Medical: Not on file    Non-medical: Not on file  Tobacco Use  . Smoking status: Former Smoker    Packs/day: 1.00    Years: 50.00    Pack years: 50.00    Types: Cigarettes  . Smokeless tobacco: Never Used  . Tobacco comment: Has not smoked in about 1 week as of 2018/12/27  Substance and Sexual Activity  . Alcohol use: Never  Frequency: Never  . Drug use: Never  . Sexual activity: Never  Lifestyle  . Physical activity:    Days per week: Not on file    Minutes per session: Not on file  . Stress: Not on file  Relationships  . Social connections:    Talks on phone: Not on file    Gets together: Not on file    Attends religious service: Not on file    Active member of club or organization: Not  on file    Attends meetings of clubs or organizations: Not on file    Relationship status: Not on file  . Intimate partner violence:    Fear of current or ex partner: Not on file    Emotionally abused: Not on file    Physically abused: Not on file    Forced sexual activity: Not on file  Other Topics Concern  . Not on file  Social History Narrative  . Not on file    FAMILY HISTORY: Family History  Problem Relation Age of Onset  . Cancer Father        brain tumor  . Cancer Sister 107       breast ca  . Cancer Paternal Grandmother 45       colon ca    ALLERGIES:  is allergic to penicillins.  MEDICATIONS:  Current Facility-Administered Medications  Medication Dose Route Frequency Provider Last Rate Last Dose  . acetaminophen (TYLENOL) tablet 650 mg  650 mg Oral Q6H PRN Patrecia Pour, MD       Or  . acetaminophen (TYLENOL) suppository 650 mg  650 mg Rectal Q6H PRN Patrecia Pour, MD      . bisacodyl (DULCOLAX) EC tablet 10 mg  10 mg Oral Daily Georgette Shell, MD   10 mg at 01/01/19 3646  . bisacodyl (DULCOLAX) suppository 10 mg  10 mg Rectal Once Georgette Shell, MD      . bismuth subsalicylate (PEPTO BISMOL) 262 MG/15ML suspension 30 mL  30 mL Oral Q4H PRN Patrecia Pour, MD      . enoxaparin (LOVENOX) injection 80 mg  1 mg/kg Subcutaneous Q12H Eudelia Bunch, RPH   80 mg at 01/01/19 0906  . hydrALAZINE (APRESOLINE) injection 10 mg  10 mg Intravenous Q6H PRN Bodenheimer, Charles A, NP   10 mg at 12/27/18 2007  . ondansetron (ZOFRAN) injection 4 mg  4 mg Intravenous Once Patrecia Pour, MD      . ondansetron [facility]) tablet 4 mg  4 mg Oral Q6H PRN Patrecia Pour, MD       Or  . ondansetron [facility]) injection 4 mg  4 mg Intravenous Q6H PRN Patrecia Pour, MD   4 mg at 12/28/18 0316  . pneumococcal 23 valent vaccine (PNU-IMMUNE) injection 0.5 mL  0.5 mL Intramuscular Tomorrow-1000 Patrecia Pour, MD        REVIEW OF SYSTEMS:   Constitutional: Denies fevers, chills or  abnormal night sweats Eyes: Denies blurriness of vision, double vision or watery eyes Ears, nose, mouth, throat, and face: Denies mucositis or sore throat Respiratory: Denies cough, dyspnea or wheezes Cardiovascular: Denies palpitation, chest discomfort  Skin: Denies abnormal skin rashes Lymphatics: Denies new lymphadenopathy or easy bruising Behavioral/Psych: Mood is stable, no new changes  All other systems were reviewed with the patient and are negative.  PHYSICAL EXAMINATION: ECOG PERFORMANCE STATUS: 2 - Symptomatic, <50% confined to bed  Vitals:   01/01/19 0542 01/01/19 1405  BP: 124/68 126/62  Pulse: 87 72  Resp: 18 17  Temp: 98.4 F (36.9 C) 98.5 F (36.9 C)  SpO2: 98% 98%   Filed Weights   12/27/18 1050  Weight: 175 lb (79.4 kg)    GENERAL:alert, no distress and comfortable.  She looks debilitated SKIN: skin color, texture, turgor are normal, no rashes or significant lesions EYES: normal, conjunctiva are pink and non-injected, sclera clear OROPHARYNX:no exudate, no erythema and lips, buccal mucosa, and tongue normal  NECK: supple, thyroid normal size, non-tender, without nodularity LYMPH:  no palpable lymphadenopathy in the cervical, axillary or inguinal LUNGS: clear to auscultation and percussion with normal breathing effort HEART: regular rate & rhythm and no murmurs with significant bilateral lower extremity edema, left greater than right ABDOMEN:abdomen soft, profoundly distended with malignant ascites, noted ventral hernia, non-tender and normal bowel sounds Musculoskeletal:no cyanosis of digits and no clubbing  PSYCH: alert & oriented x 3 with fluent speech NEURO: no focal motor/sensory deficits  LABORATORY DATA:  I have reviewed the data as listed Lab Results  Component Value Date   WBC 11.2 (H) 01/01/2019   HGB 10.5 (L) 01/01/2019   HCT 33.6 (L) 01/01/2019   MCV 92.8 01/01/2019   PLT 144 (L) 01/01/2019   Recent Labs    12/27/18 1108  12/29/18 0417  12/30/18 0517 12/31/18 0541  NA 138   < > 137 138 139  K 4.7   < > 4.3 4.4 4.4  CL 101   < > 108 110 111  CO2 20*   < > 21* 20* 19*  GLUCOSE 133*   < > 111* 109* 102*  BUN 38*   < > 60* 50* 38*  CREATININE 1.87*   < > 2.11* 1.59* 1.22*  CALCIUM 10.1   < > 8.8* 8.5* 8.3*  GFRNONAA 26*   < > 23* 32* 44*  GFRAA 30*   < > 26* 37* 51*  PROT 9.2*  --   --   --  5.0*  ALBUMIN 3.6  --   --   --  2.2*  AST 32  --   --   --  35  ALT 21  --   --   --  26  ALKPHOS 84  --   --   --  56  BILITOT 0.9  --   --   --  0.6   < > = values in this interval not displayed.    RADIOGRAPHIC STUDIES: I have personally reviewed the radiological images as listed and agreed with the findings in the report. Ct Abdomen Pelvis Wo Contrast  Result Date: 12/27/2018 CLINICAL DATA:  74 year old female with history of abdominal distension. Evaluate for potential mass. EXAM: CT CHEST, ABDOMEN AND PELVIS WITHOUT CONTRAST TECHNIQUE: Multidetector CT imaging of the chest, abdomen and pelvis was performed following the standard protocol without IV contrast. COMPARISON:  None. FINDINGS: CT CHEST FINDINGS Cardiovascular: Heart size is normal. There is no significant pericardial fluid, thickening or pericardial calcification. Atherosclerotic calcifications in the left anterior descending coronary artery. Mediastinum/Nodes: No pathologically enlarged mediastinal or hilar lymph nodes. Moderate-sized hiatal hernia. No axillary lymphadenopathy. Lungs/Pleura: Mild diffuse bronchial wall thickening with mild centrilobular and paraseptal emphysema. Innumerable tiny 1-3 mm pulmonary nodules scattered throughout the periphery of the lungs bilaterally, nonspecific. No larger more suspicious appearing pulmonary nodules or masses are noted. No acute consolidative airspace disease. No pleural effusions. Areas of linear scarring noted in the right middle lobe, inferior segment of the lingula and  anterior aspect of the left lower lobe.  Musculoskeletal: There are no aggressive appearing lytic or blastic lesions noted in the visualized portions of the skeleton. CT ABDOMEN PELVIS FINDINGS Hepatobiliary: No definite cystic or solid hepatic lesions are confidently identified on today's noncontrast CT examination. Gallbladder is not confidently identified may be surgically absent, or may be obscured by adjacent ascites. Pancreas: No definite pancreatic mass noted on today's noncontrast CT examination. No definite surrounding peripancreatic inflammatory changes. Spleen: Unremarkable. Adrenals/Urinary Tract: Unenhanced appearance of the kidneys and bilateral adrenal glands is normal. No hydroureteronephrosis. Urinary bladder is poorly demonstrated, but unremarkable in appearance. Stomach/Bowel: Normal appearance of the stomach. No pathologic dilatation of small bowel or colon. Appendix is not confidently identified. Vascular/Lymphatic: Aortic atherosclerosis. No lymphadenopathy identified in the abdomen or pelvis on today's noncontrast CT examination. Reproductive: Unenhanced appearance of the uterus is unremarkable. In the central abdomen and pelvis there is a very large centrally low-attenuation rim enhancing mass (axial image 85 of series 2 and sagittal image 92 of series 7) measuring 28.7 x 25.6 x 25.6 cm. This appears potentially related to the right adnexal region, although the origin of this lesion is uncertain. Left ovary is not confidently identified. Other: Large volume of ascites.  No pneumoperitoneum. Musculoskeletal: There are no aggressive appearing lytic or blastic lesions noted in the visualized portions of the skeleton. IMPRESSION: 1. 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 This is associated with a very large volume of (presumably malignant) ascites. 2. Multiple tiny 1-3 mm pulmonary nodules scattered throughout the lungs bilaterally.  These are nonspecific, but favored to represent benign areas of mucoid impaction within terminal bronchioles. However, close attention on follow-up studies is recommended to ensure the stability or resolution of this finding, as the possibility of metastatic disease is not excluded. 3. Aortic atherosclerosis, in addition to left anterior descending coronary artery disease. Assessment for potential risk factor modification, dietary therapy or pharmacologic therapy may be warranted, if clinically indicated. Electronically Signed   By: Vinnie Langton M.D.   On: 12/27/2018 13:41   Ct Chest Wo Contrast  Result Date: 12/27/2018 CLINICAL DATA:  73 year old female with history of abdominal distension. Evaluate for potential mass. EXAM: CT CHEST, ABDOMEN AND PELVIS WITHOUT CONTRAST TECHNIQUE: Multidetector CT imaging of the chest, abdomen and pelvis was performed following the standard protocol without IV contrast. COMPARISON:  None. FINDINGS: CT CHEST FINDINGS Cardiovascular: Heart size is normal. There is no significant pericardial fluid, thickening or pericardial calcification. Atherosclerotic calcifications in the left anterior descending coronary artery. Mediastinum/Nodes: No pathologically enlarged mediastinal or hilar lymph nodes. Moderate-sized hiatal hernia. No axillary lymphadenopathy. Lungs/Pleura: Mild diffuse bronchial wall thickening with mild centrilobular and paraseptal emphysema. Innumerable tiny 1-3 mm pulmonary nodules scattered throughout the periphery of the lungs bilaterally, nonspecific. No larger more suspicious appearing pulmonary nodules or masses are noted. No acute consolidative airspace disease. No pleural effusions. Areas of linear scarring noted in the right middle lobe, inferior segment of the lingula and anterior aspect of the left lower lobe. Musculoskeletal: There are no aggressive appearing lytic or blastic lesions noted in the visualized portions of the skeleton. CT ABDOMEN PELVIS  FINDINGS Hepatobiliary: No definite cystic or solid hepatic lesions are confidently identified on today's noncontrast CT examination. Gallbladder is not confidently identified may be surgically absent, or may be obscured by adjacent ascites. Pancreas: No definite pancreatic mass noted on today's noncontrast CT  examination. No definite surrounding peripancreatic inflammatory changes. Spleen: Unremarkable. Adrenals/Urinary Tract: Unenhanced appearance of the kidneys and bilateral adrenal glands is normal. No hydroureteronephrosis. Urinary bladder is poorly demonstrated, but unremarkable in appearance. Stomach/Bowel: Normal appearance of the stomach. No pathologic dilatation of small bowel or colon. Appendix is not confidently identified. Vascular/Lymphatic: Aortic atherosclerosis. No lymphadenopathy identified in the abdomen or pelvis on today's noncontrast CT examination. Reproductive: Unenhanced appearance of the uterus is unremarkable. In the central abdomen and pelvis there is a very large centrally low-attenuation rim enhancing mass (axial image 85 of series 2 and sagittal image 92 of series 7) measuring 28.7 x 25.6 x 25.6 cm. This appears potentially related to the right adnexal region, although the origin of this lesion is uncertain. Left ovary is not confidently identified. Other: Large volume of ascites.  No pneumoperitoneum. Musculoskeletal: There are no aggressive appearing lytic or blastic lesions noted in the visualized portions of the skeleton. IMPRESSION: 1. 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 This is associated with a very large volume of (presumably malignant) ascites. 2. Multiple tiny 1-3 mm pulmonary nodules scattered throughout the lungs bilaterally. These are nonspecific, but favored to represent benign areas of mucoid impaction within terminal bronchioles. However, close attention on  follow-up studies is recommended to ensure the stability or resolution of this finding, as the possibility of metastatic disease is not excluded. 3. Aortic atherosclerosis, in addition to left anterior descending coronary artery disease. Assessment for potential risk factor modification, dietary therapy or pharmacologic therapy may be warranted, if clinically indicated. Electronically Signed   By: Vinnie Langton M.D.   On: 12/27/2018 13:41   Ir Ivc Filter Plmt / S&i /img Guid/mod Sed  Result Date: 12/31/2018 INDICATION: 73 year old with a large central abdominal and pelvic mass. EXAM: IVC FILTER PLACEMENT; IVC VENOGRAM; ULTRASOUND FOR VASCULAR ACCESS Physician: Stephan Minister. Anselm Pancoast, MD MEDICATIONS: None. ANESTHESIA/SEDATION: Fentanyl 100 mcg IV; Versed 2.0 mg IV Moderate Sedation Time:  26 minutes minutes The patient was continuously monitored during the procedure by the interventional radiology nurse under my direct supervision. CONTRAST:  Carbon dioxide FLUOROSCOPY TIME:  Fluoroscopy Time: 4 minutes 48 seconds (437 mGy). COMPLICATIONS: None immediate. PROCEDURE: The procedure was explained to the patient. The risks and benefits of the procedure were discussed and the patient's questions were addressed. Informed consent was obtained from the patient. Ultrasound demonstrated a patent right internal jugular vein. Ultrasound images were obtained for documentation. The right side of the neck was prepped and draped in a sterile fashion. Maximal barrier sterile technique was utilized including caps, mask, sterile gowns, sterile gloves, sterile drape, hand hygiene and skin antiseptic. The skin was anesthetized with 1% lidocaine. A 21 gauge needle was directed into the vein with ultrasound guidance and a micropuncture dilator set was placed. A wire was advanced into the IVC. The filter sheath was advanced over the wire into the IVC. An IVC venogram was performed with carbon dioxide due to renal insufficiency. Fluoroscopic  images were obtained for documentation. Renal veins were not confidently identified with carbon dioxide. Therefore, 5 French catheter and Bentson wire were used to cannulate the bilateral renal veins. A Bard Denali filter was deployed below the lowest renal vein. Left renal vein was again cannulated with a catheter and wire to ensure placement below the renal vein. Final spot radiograph images were obtained. Vascular sheath was removed with manual compression. Bandage placed over  the puncture site. FINDINGS: IVC is small but patent. Bilateral renal veins were identified by cannulating the veins with a catheter and wire. The filter was deployed below the lowest renal vein. IMPRESSION: Successful placement of a retrievable IVC filter. PLAN: This IVC filter is potentially retrievable. The patient will be assessed for filter retrieval by Interventional Radiology in approximately 8-12 weeks. Further recommendations regarding filter retrieval, continued surveillance or declaration of device permanence, will be made at that time. Electronically Signed   By: Markus Daft M.D.   On: 12/31/2018 13:28   US Paracentesis  Result Date: 01/01/2019 INDICATION: Patient with history of metastatic adenocarcinoma, abdominopelvic mass, recurrent malignant ascites. Request made for therapeutic paracentesis. EXAM: ULTRASOUND GUIDED THERAPEUTIC PARACENTESIS MEDICATIONS: None COMPLICATIONS: None immediate. PROCEDURE: Informed written consent was obtained from the patient after a discussion of the risks, benefits and alternatives to treatment. A timeout was performed prior to the initiation of the procedure. Initial ultrasound scanning demonstrates a large amount of ascites within the left lower abdominal quadrant. The left lower abdomen was prepped and draped in the usual sterile fashion. 1% lidocaine was used for local anesthesia. Following this, a 19 gauge, 10-cm, Yueh catheter was introduced. An ultrasound image was saved for  documentation purposes. The paracentesis was performed. The catheter was removed and a dressing was applied. The patient tolerated the procedure well without immediate post procedural complication. FINDINGS: A total of approximately 9.3 liters of slightly hazy, yellow fluid was removed. IMPRESSION: Successful ultrasound-guided therapeutic paracentesis yielding 9.3 liters of peritoneal fluid. Read by: Rowe Robert, PA-C Electronically Signed   By: Markus Daft M.D.   On: 12/31/2018 17:38   US Paracentesis  Result Date: 12/28/2018 INDICATION: Patient with history of lower extremity DVT, abdomino-pelvic mass, renal insufficiency, ascites ; request made for diagnostic and therapeutic paracentesis up to 5 liters. EXAM: ULTRASOUND GUIDED DIAGNOSTIC AND THERAPEUTIC PARACENTESIS MEDICATIONS: None COMPLICATIONS: None immediate. PROCEDURE: Informed written consent was obtained from the patient after a discussion of the risks, benefits and alternatives to treatment. A timeout was performed prior to the initiation of the procedure. Initial ultrasound scanning demonstrates a large amount of ascites within the left mid to lower abdominal quadrant. The left mid to lower abdomen was prepped and draped in the usual sterile fashion. 1% lidocaine was used for local anesthesia. Following this, a 19 gauge, 7-cm, Yueh catheter was introduced. An ultrasound image was saved for documentation purposes. The paracentesis was performed. The catheter was removed and a dressing was applied. The patient tolerated the procedure well without immediate post procedural complication. FINDINGS: A total of approximately 5 liters of slightly hazy, yellow fluid was removed. Samples were sent to the laboratory as requested by the clinical team. IMPRESSION: Successful ultrasound-guided diagnostic and therapeutic paracentesis yielding 5 liters of peritoneal fluid. Read by: Rowe Robert, PA-C Electronically Signed   By: Sandi Mariscal M.D.   On: 12/28/2018  15:45   Dg Chest Portable 1 View  Result Date: 12/27/2018 CLINICAL DATA:  Abdominal distension and leg swelling EXAM: PORTABLE CHEST 1 VIEW COMPARISON:  None. FINDINGS: Monitoring leads overlie the patient. Normal cardiac and mediastinal contours. No consolidative pulmonary opacities. No pleural effusion or pneumothorax. Thoracic spine degenerative changes. IMPRESSION: No active disease. Electronically Signed   By: Lovey Newcomer M.D.   On: 12/27/2018 11:48   Vas [place] Lower Extremity Venous (dvt) (only [facility])  Result Date: 12/29/2018  Lower Venous Study Indications: Pain, Swelling, and Edema.  Limitations: Body habitus and tightenedness due to  edema. Comparison Study: No prior study available. Performing Technologist: Lorina Rabon  Examination Guidelines: A complete evaluation includes B-mode imaging, spectral Doppler, color Doppler, and power Doppler as needed of all accessible portions of each vessel. Bilateral testing is considered an integral part of a complete examination. Limited examinations for reoccurring indications may be performed as noted.  Right Venous Findings: +---+---------------+---------+-----------+----------+-------+    CompressibilityPhasicitySpontaneityPropertiesSummary +---+---------------+---------+-----------+----------+-------+ CFVFull           Yes      Yes                          +---+---------------+---------+-----------+----------+-------+  Left Venous Findings: +---------+---------------+---------+-----------+----------+-----------------+          CompressibilityPhasicitySpontaneityPropertiesSummary           +---------+---------------+---------+-----------+----------+-----------------+ CFV      None           No       No                   Acute             +---------+---------------+---------+-----------+----------+-----------------+ SFJ      None                                         Acute              +---------+---------------+---------+-----------+----------+-----------------+ FV Prox  None                                         Acute             +---------+---------------+---------+-----------+----------+-----------------+ FV Mid   None                                         Acute             +---------+---------------+---------+-----------+----------+-----------------+ FV DistalNone                                         Acute             +---------+---------------+---------+-----------+----------+-----------------+ PFV      None                                         Age Indeterminate +---------+---------------+---------+-----------+----------+-----------------+ POP      None                                         Age Indeterminate +---------+---------------+---------+-----------+----------+-----------------+ PTV      None                                         Age Indeterminate +---------+---------------+---------+-----------+----------+-----------------+ PERO     None  Age Indeterminate +---------+---------------+---------+-----------+----------+-----------------+ GSV      None                                         Acute             +---------+---------------+---------+-----------+----------+-----------------+ EIV      None           No                            Acute             +---------+---------------+---------+-----------+----------+-----------------+  Summary: Right: No evidence of common femoral vein obstruction. Left: Findings consistent with acute deep vein thrombosis involving the left common femoral vein, and left femoral vein. Findings consistent with acute superficial vein thrombosis involving the left great saphenous vein. Findings consistent with age indeterminate deep vein thrombosis involving the left proximal profunda vein, left popliteal vein, left posterior tibial vein, and  left peroneal vein. Left external iliac vein appears thrombosied. IVC was not viualized. No cystic structure found in the popliteal fossa.  *See table(s) above for measurements and observations. Electronically signed by Deitra Mayo MD on 12/29/2018 at 6:38:59 AM.    Final

## 2019-01-01 NOTE — Progress Notes (Signed)
   01/01/19 1512  Clinical Encounter Type  Visited With Patient  Visit Type Initial  Referral From Physician  Consult/Referral To Chaplain  The chaplain responded to Pt. consult for AD.  The Pt. warmly greeted the chaplain upon arrival.  The Pt. had a copy of the AD education in her room.  It was the Pt. preference to read it on her own.  The chaplain informed the Pt. how to get in touch with the chaplain if she has questions or wants to complete the document.  The chaplain heard the Pt. leaning on prayer from her friends and her faith through the cancer diagnosis and upcoming chemotherapy.  The Pt. chose to let her daughter and law tell her son the details of her illness. The Pt. accepted F/U spiritual care from the chaplain.

## 2019-01-02 DIAGNOSIS — C569 Malignant neoplasm of unspecified ovary: Secondary | ICD-10-CM

## 2019-01-02 LAB — BASIC METABOLIC PANEL
Anion gap: 10 (ref 5–15)
BUN: 28 mg/dL — ABNORMAL HIGH (ref 8–23)
CO2: 19 mmol/L — ABNORMAL LOW (ref 22–32)
Calcium: 8.2 mg/dL — ABNORMAL LOW (ref 8.9–10.3)
Chloride: 108 mmol/L (ref 98–111)
Creatinine, Ser: 1.13 mg/dL — ABNORMAL HIGH (ref 0.44–1.00)
GFR calc Af Amer: 56 mL/min — ABNORMAL LOW (ref >60)
GFR calc non Af Amer: 48 mL/min — ABNORMAL LOW (ref >60)
Glucose, Bld: 99 mg/dL (ref 70–99)
Potassium: 4.2 mmol/L (ref 3.5–5.1)
Sodium: 137 mmol/L (ref 135–145)

## 2019-01-02 LAB — CULTURE, BODY FLUID-BOTTLE: CULTURE: NO GROWTH

## 2019-01-02 LAB — CBC
HCT: 33.6 % — ABNORMAL LOW (ref 36.0–46.0)
Hemoglobin: 10.6 g/dL — ABNORMAL LOW (ref 12.0–15.0)
MCH: 28.6 pg (ref 26.0–34.0)
MCHC: 31.5 g/dL (ref 30.0–36.0)
MCV: 90.8 fL (ref 80.0–100.0)
Platelets: 142 10*3/uL — ABNORMAL LOW (ref 150–400)
RBC: 3.7 MIL/uL — ABNORMAL LOW (ref 3.87–5.11)
RDW: 13.6 % (ref 11.5–15.5)
WBC: 9.2 10*3/uL (ref 4.0–10.5)
nRBC: 0 % (ref 0.0–0.2)

## 2019-01-02 LAB — CULTURE, BODY FLUID W GRAM STAIN -BOTTLE

## 2019-01-02 NOTE — Progress Notes (Signed)
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 IVC filter placed by interventional radiology 12/31/2018.  Continue Lovenox for treatment of DVT.  Patient to have inpatient chemotherapy started by Dr. Lurlean Leyden.  Discussed with Dr. Harrington Challenger.  #Ascites malignant, improving slightly. Status post 5 L removed by paracentesis on 12/30 and another 9.3 L removed 12/31/2018.dgnostic ascitic fluid analysisnegative for SBP.Patient unfortunately has recurrent ascites needing frequent paracentesis.   #Ovarian mass most likely locally advanced ovarian cancer for starting chemotherapy on Monday for 3 cycles followed by debulking surgery in the near future. Patient will follow-up with GYN as an outpatient to discuss about surgery.  #Leukocytosis.Resolved.   #AKI,improving. Seemsprerenal given diminished p.o. intakeand slow improvement with IVF. No obstruction on CT abdomen.   #Scattered pulmonary nodules throughout lungs bilaterally. Nonspecific, could be benign, will need follow-up imaging to ensure not metastatic disease  Constipation Resolved     Estimated body mass index is 27.41 kg/m as calculated from the following:   Height as of this encounter: 5\' 7"  (1.702 m).   Weight as of this encounter: 79.4 kg.  DVT prophylaxis: Lovenox Code Status full code Family Communication: None Disposition  Plan: Unknown at this time Consultants:  GYN oncology, interventional radiology, oncology  Procedures: Paracentesis x2 and IVC filter placement Antimicrobials: None Subjective: Resting in bed reports having a big bowel movement yesterday no nausea vomiting able to tolerate p.o. intake Objective: Vitals:   01/01/19 0542 01/01/19 1405 01/01/19 2058 01/02/19 0526  BP: 124/68 126/62 140/61 (!) 133/52  Pulse: 87 72 88 80  Resp: 18 17 18 18   Temp: 98.4 F (36.9 C) 98.5 F (36.9 C) 98.7 F (37.1 C) 98.3 F (36.8 C)  TempSrc: Oral Oral Oral Oral  SpO2: 98% 98% 97% 96%  Weight:      Height:        Intake/Output Summary (Last 24 hours) at 01/02/2019 0959 Last data filed at 01/02/2019 2671 Gross per 24 hour  Intake 480 ml  Output 1327 ml  Net -847 ml   Filed Weights   12/27/18 1050  Weight: 79.4 kg    Examination:  General exam: Appears calm and comfortable  Respiratory system: Clear to auscultation. Respiratory effort normal. Cardiovascular system: S1 & S2 heard, RRR. No JVD, murmurs, rubs, gallops or clicks. No pedal edema. Gastrointestinal system: Abdomen is distended, soft and nontender. No organomegaly or masses felt. Normal bowel sounds heard. Central nervous system: Alert and oriented. No focal neurological deficits. Extremities: 3+ left lower extremity edema Skin: No rashes, lesions or ulcers Psychiatry: Judgement and insight appear normal. Mood & affect appropriate.     Data Reviewed: I have personally reviewed following labs and imaging studies  CBC: Recent Labs  Lab 12/27/18 1108  12/29/18 0417 12/30/18 0517 12/31/18 0541 01/01/19 0506 01/02/19 0518  WBC 13.0*   < > 13.2* 10.7* 9.2 11.2* 9.2  NEUTROABS 11.2*  --   --   --  6.6  --   --   HGB 14.2   < > 10.9* 10.5* 10.4* 10.5* 10.6*  HCT 44.6   < > 33.5* 33.3* 32.5* 33.6* 33.6*  MCV 91.4   < > 90.3 93.0 91.3 92.8 90.8  PLT 172   < > 184 206 238 144* 142*   < > = values in this interval not displayed.    Basic Metabolic Panel: Recent Labs  Lab 12/28/18 0206 12/29/18 0417 12/30/18 0517 12/31/18 0541 01/02/19 0518  NA 137 137 138 139 137  K 4.7 4.3 4.4 4.4 4.2  CL 104 108 110 111 108  CO2 18* 21* 20* 19* 19*  GLUCOSE 123* 111* 109* 102* 99  BUN 49* 60* 50* 38* 28*  CREATININE 2.38* 2.11* 1.59* 1.22* 1.13*  CALCIUM 9.2 8.8* 8.5* 8.3* 8.2*   GFR: Estimated Creatinine Clearance: 48.1 mL/min (A) (by C-G formula based on SCr of 1.13 mg/dL (H)). Liver Function Tests: Recent Labs  Lab 12/27/18 1108 12/31/18 0541  AST 32 35  ALT 21 26  ALKPHOS 84 56  BILITOT 0.9 0.6  PROT 9.2* 5.0*  ALBUMIN 3.6 2.2*   Recent Labs  Lab 12/27/18 1108  LIPASE 34   No results for input(s): AMMONIA in the last 168 hours. Coagulation Profile: Recent Labs  Lab 12/27/18 1108  INR 1.22   Cardiac Enzymes: No results for input(s): CKTOTAL, CKMB, CKMBINDEX, TROPONINI in the last 168 hours. BNP (last 3 results) No results for input(s): PROBNP in the last 8760 hours. HbA1C: No results for input(s): HGBA1C in the last 72 hours. CBG: No results for input(s): GLUCAP in the last 168 hours. Lipid Profile: No results for input(s): CHOL, HDL, LDLCALC, TRIG, CHOLHDL, LDLDIRECT in the last 72 hours. Thyroid Function Tests: No results for input(s): TSH, T4TOTAL, FREET4, T3FREE, THYROIDAB in the last 72 hours. Anemia Panel: No results for input(s): VITAMINB12, FOLATE, FERRITIN, TIBC, IRON, RETICCTPCT in the last 72 hours. Sepsis Labs: No results for input(s): PROCALCITON, LATICACIDVEN in the last 168 hours.  Recent Results (from the past 240 hour(s))  Culture, body fluid-bottle     Status: None   Collection Time: 12/28/18  3:05 PM  Result Value Ref Range Status   Specimen Description PERITONEAL  Final   Special Requests   Final    NONE Performed at [facility], [address] [place], [place] [address]    Culture NO GROWTH 5 DAYS  Final   Report Status 01/02/2019 FINAL  Final  Gram  stain     Status: None   Collection Time: 12/28/18  3:05 PM  Result Value Ref Range Status   Specimen Description PERITONEAL  Final   Special Requests NONE  Final   Gram Stain   Final    RARE WBC PRESENT, PREDOMINANTLY MONONUCLEAR NO ORGANISMS SEEN Performed at [facility], [place] [address] [place], [place] [address]    Report Status 12/29/2018 FINAL  Final         Radiology Studies: Ir Ivc Filter Plmt / S&i /img Guid/mod Sed  Result Date: 12/31/2018 INDICATION: 73 year old with a large central abdominal and pelvic mass. EXAM: IVC FILTER PLACEMENT; IVC VENOGRAM; ULTRASOUND FOR VASCULAR ACCESS Physician: Stephan Minister. Anselm Pancoast, MD MEDICATIONS: None. ANESTHESIA/SEDATION: Fentanyl 100 mcg IV; Versed 2.0 mg IV Moderate Sedation Time:  26 minutes minutes The patient was continuously monitored during the procedure by the interventional radiology nurse under my direct supervision. CONTRAST:  Carbon dioxide FLUOROSCOPY TIME:  Fluoroscopy Time: 4 minutes  48 seconds (437 mGy). COMPLICATIONS: None immediate. PROCEDURE: The procedure was explained to the patient. The risks and benefits of the procedure were discussed and the patient's questions were addressed. Informed consent was obtained from the patient. Ultrasound demonstrated a patent right internal jugular vein. Ultrasound images were obtained for documentation. The right side of the neck was prepped and draped in a sterile fashion. Maximal barrier sterile technique was utilized including caps, mask, sterile gowns, sterile gloves, sterile drape, hand hygiene and skin antiseptic. The skin was anesthetized with 1% lidocaine. A 21 gauge needle was directed into the vein with ultrasound guidance and a micropuncture dilator set was placed. A wire was advanced into the IVC. The filter sheath was advanced over the wire into the IVC. An IVC venogram was performed with carbon dioxide due to renal insufficiency. Fluoroscopic images were obtained for documentation.  Renal veins were not confidently identified with carbon dioxide. Therefore, 5 French catheter and Bentson wire were used to cannulate the bilateral renal veins. A Bard Denali filter was deployed below the lowest renal vein. Left renal vein was again cannulated with a catheter and wire to ensure placement below the renal vein. Final spot radiograph images were obtained. Vascular sheath was removed with manual compression. Bandage placed over the puncture site. FINDINGS: IVC is small but patent. Bilateral renal veins were identified by cannulating the veins with a catheter and wire. The filter was deployed below the lowest renal vein. IMPRESSION: Successful placement of a retrievable IVC filter. PLAN: This IVC filter is potentially retrievable. The patient will be assessed for filter retrieval by Interventional Radiology in approximately 8-12 weeks. Further recommendations regarding filter retrieval, continued surveillance or declaration of device permanence, will be made at that time. Electronically Signed   By: Markus Daft M.D.   On: 12/31/2018 13:28   US Paracentesis  Result Date: 01/01/2019 INDICATION: Patient with history of metastatic adenocarcinoma, abdominopelvic mass, recurrent malignant ascites. Request made for therapeutic paracentesis. EXAM: ULTRASOUND GUIDED THERAPEUTIC PARACENTESIS MEDICATIONS: None COMPLICATIONS: None immediate. PROCEDURE: Informed written consent was obtained from the patient after a discussion of the risks, benefits and alternatives to treatment. A timeout was performed prior to the initiation of the procedure. Initial ultrasound scanning demonstrates a large amount of ascites within the left lower abdominal quadrant. The left lower abdomen was prepped and draped in the usual sterile fashion. 1% lidocaine was used for local anesthesia. Following this, a 19 gauge, 10-cm, Yueh catheter was introduced. An ultrasound image was saved for documentation purposes. The paracentesis was  performed. The catheter was removed and a dressing was applied. The patient tolerated the procedure well without immediate post procedural complication. FINDINGS: A total of approximately 9.3 liters of slightly hazy, yellow fluid was removed. IMPRESSION: Successful ultrasound-guided therapeutic paracentesis yielding 9.3 liters of peritoneal fluid. Read by: Rowe Robert, PA-C Electronically Signed   By: Markus Daft M.D.   On: 12/31/2018 17:38        Scheduled Meds: . bisacodyl  10 mg Oral Daily  . bisacodyl  10 mg Rectal Once  . [START ON 01/03/2019] dexamethasone  20 mg Oral Once  . [START ON 01/04/2019] dexamethasone  20 mg Oral Once  . enoxaparin (LOVENOX) injection  1 mg/kg Subcutaneous Q12H  . ondansetron (ZOFRAN) IV  4 mg Intravenous Once  . pneumococcal 23 valent vaccine  0.5 mL Intramuscular Tomorrow-1000   Continuous Infusions:   LOS: 6 days    Georgette Shell, MD [facility]  If 7PM-7AM, please contact night-coverage  [email or web address] Password [email or web address] 01/02/2019, 9:59 AM

## 2019-01-02 NOTE — Progress Notes (Signed)
Attempted to transfer [address] to Oncology, but the unit is full at this time.  Will Report to oncoming nurse to attempt when bed becomes available.

## 2019-01-02 NOTE — Progress Notes (Signed)
Pt admitted to unit with no complaints or concerns at this time. VS WNL.

## 2019-01-02 NOTE — Plan of Care (Signed)
Patient resting in bed this morning. No complaints of pain currently, but states pain is generally only in left leg around the knee area where most of the swelling is. Will continue to monitor.

## 2019-01-03 LAB — BASIC METABOLIC PANEL
Anion gap: 10 (ref 5–15)
BUN: 27 mg/dL — ABNORMAL HIGH (ref 8–23)
CO2: 21 mmol/L — ABNORMAL LOW (ref 22–32)
Calcium: 8.2 mg/dL — ABNORMAL LOW (ref 8.9–10.3)
Chloride: 106 mmol/L (ref 98–111)
Creatinine, Ser: 1.03 mg/dL — ABNORMAL HIGH (ref 0.44–1.00)
GFR calc Af Amer: >60 mL/min (ref >60)
GFR calc non Af Amer: 54 mL/min — ABNORMAL LOW (ref >60)
Glucose, Bld: 112 mg/dL — ABNORMAL HIGH (ref 70–99)
Potassium: 4.1 mmol/L (ref 3.5–5.1)
SODIUM: 137 mmol/L (ref 135–145)

## 2019-01-03 LAB — CBC
HCT: 32.8 % — ABNORMAL LOW (ref 36.0–46.0)
Hemoglobin: 10.3 g/dL — ABNORMAL LOW (ref 12.0–15.0)
MCH: 28.4 pg (ref 26.0–34.0)
MCHC: 31.4 g/dL (ref 30.0–36.0)
MCV: 90.4 fL (ref 80.0–100.0)
Platelets: 172 10*3/uL (ref 150–400)
RBC: 3.63 MIL/uL — ABNORMAL LOW (ref 3.87–5.11)
RDW: 13.6 % (ref 11.5–15.5)
WBC: 8.5 10*3/uL (ref 4.0–10.5)
nRBC: 0 % (ref 0.0–0.2)

## 2019-01-03 NOTE — Progress Notes (Signed)
Chemo teaching, including side effects provided for Carbo and Paclitaxel. Chemo binder including 'Chemo and You' and other educational booklets left with patient. Pt encouraged to ask questions as they arise. Chemo consent signed and placed in chart. Will continue to monitor.

## 2019-01-03 NOTE — Progress Notes (Signed)
PROGRESS NOTE    Haley Palmer  TIW:[id number] DOB: 06/26/1946 DOA: 12/27/2018 PCP: Patient, No Pcp Per    Brief Narrative: 73 y.o.year old femalewith no significant medical history who presented on 12/29/2019with unintentional weight gain, enlarging abdomen, dyspnea on exertion for several months and new left leg swelling and pain over 2 days and was found to have ovarian mass with associated ascites and acute left leg DVT   Assessment & Plan:   Active Problems:   Acute DVT (deep venous thrombosis) (HCC)   Generalized abdominal mass   AKI (acute kidney injury) ([place])   Malignant neoplasm of ovary (HCC)  #AcuteLeft leg DVT. IVC filter placed by interventional radiology 12/31/2018.Continue Lovenox for treatment of DVT. Patient to have inpatient chemotherapy started by Dr. Lurlean Leyden tomorrow  #Ascites malignant, Status post 5 L removed by paracentesis on 12/30and another 9.3 L removed 12/31/2018.dgnostic ascitic fluid analysisnegative for SBP.Patient unfortunately has recurrent ascites needing frequent paracentesis.   #Ovarian mass most likely locally advanced ovarian cancer for starting chemotherapy on Monday for 3 cycles followed by debulking surgery in the near future. Patient will follow-up with GYN as an outpatient to discuss about surgery.  #Leukocytosis.Resolved.   #AKI,improving. Seemsprerenal given diminished p.o. intakeand slow improvement with IVF. No obstruction on CT abdomen.   #Scattered pulmonary nodules throughout lungs bilaterally. Nonspecific, could be benign, will need follow-up imaging to ensure not metastatic disease  Constipation Resolved    Estimated body mass index is 27.41 kg/m as calculated from the following:   Height as of this encounter: 5\' 7"  (1.702 m).   Weight as of this encounter: 79.4 kg.  DVT prophylaxis: Lovenox Code Status: Full code Family Communication: No family available Disposition Plan:   Unknown Consultants: Oncology GYN interventional radiology   Procedures: Paracentesis x2 IVC filter Antimicrobials None Subjective: Sat up in chair yesterday felt very good wants to try again having bowel movements with the Dulcolax  Objective: Vitals:   01/02/19 1400 01/02/19 1835 01/02/19 2204 01/03/19 0533  BP: (!) 145/63 (!) 126/57 101/79 130/63  Pulse: 99 89 93 82  Resp: 18 19 20 16   Temp: 98 F (36.7 C) 97.8 F (36.6 C) 97.6 F (36.4 C) 98.5 F (36.9 C)  TempSrc:  Oral Oral Oral  SpO2: 99% 100% 97% 98%  Weight:      Height:        Intake/Output Summary (Last 24 hours) at 01/03/2019 1047 Last data filed at 01/03/2019 0535 Gross per 24 hour  Intake 480 ml  Output 1125 ml  Net -645 ml   Filed Weights   12/27/18 1050  Weight: 79.4 kg    Examination:  General exam: Appears calm and comfortable  Respiratory system: Clear to auscultation. Respiratory effort normal. Cardiovascular system: S1 & S2 heard, RRR. No JVD, murmurs, rubs, gallops or clicks. No pedal edema. Gastrointestinal system: Abdomen is nondistended, soft and nontender. No organomegaly or masses felt. Normal bowel sounds heard. Central nervous system: Alert and oriented. No focal neurological deficits. Extremities: 3+ left lower extremity edema Skin: No rashes, lesions or ulcers Psychiatry: Judgement and insight appear normal. Mood & affect appropriate.     Data Reviewed: I have personally reviewed following labs and imaging studies  CBC: Recent Labs  Lab 12/27/18 1108  12/30/18 0517 12/31/18 0541 01/01/19 0506 01/02/19 0518 01/03/19 0607  WBC 13.0*   < > 10.7* 9.2 11.2* 9.2 8.5  NEUTROABS 11.2*  --   --  6.6  --   --   --  HGB 14.2   < > 10.5* 10.4* 10.5* 10.6* 10.3*  HCT 44.6   < > 33.3* 32.5* 33.6* 33.6* 32.8*  MCV 91.4   < > 93.0 91.3 92.8 90.8 90.4  PLT 172   < > 206 238 144* 142* 172   < > = values in this interval not displayed.   Basic Metabolic Panel: Recent Labs  Lab  12/29/18 0417 12/30/18 0517 12/31/18 0541 01/02/19 0518 01/03/19 0607  NA 137 138 139 137 137  K 4.3 4.4 4.4 4.2 4.1  CL 108 110 111 108 106  CO2 21* 20* 19* 19* 21*  GLUCOSE 111* 109* 102* 99 112*  BUN 60* 50* 38* 28* 27*  CREATININE 2.11* 1.59* 1.22* 1.13* 1.03*  CALCIUM 8.8* 8.5* 8.3* 8.2* 8.2*   GFR: Estimated Creatinine Clearance: 52.8 mL/min (A) (by C-G formula based on SCr of 1.03 mg/dL (H)). Liver Function Tests: Recent Labs  Lab 12/27/18 1108 12/31/18 0541  AST 32 35  ALT 21 26  ALKPHOS 84 56  BILITOT 0.9 0.6  PROT 9.2* 5.0*  ALBUMIN 3.6 2.2*   Recent Labs  Lab 12/27/18 1108  LIPASE 34   No results for input(s): AMMONIA in the last 168 hours. Coagulation Profile: Recent Labs  Lab 12/27/18 1108  INR 1.22   Cardiac Enzymes: No results for input(s): CKTOTAL, CKMB, CKMBINDEX, TROPONINI in the last 168 hours. BNP (last 3 results) No results for input(s): PROBNP in the last 8760 hours. HbA1C: No results for input(s): HGBA1C in the last 72 hours. CBG: No results for input(s): GLUCAP in the last 168 hours. Lipid Profile: No results for input(s): CHOL, HDL, LDLCALC, TRIG, CHOLHDL, LDLDIRECT in the last 72 hours. Thyroid Function Tests: No results for input(s): TSH, T4TOTAL, FREET4, T3FREE, THYROIDAB in the last 72 hours. Anemia Panel: No results for input(s): VITAMINB12, FOLATE, FERRITIN, TIBC, IRON, RETICCTPCT in the last 72 hours. Sepsis Labs: No results for input(s): PROCALCITON, LATICACIDVEN in the last 168 hours.  Recent Results (from the past 240 hour(s))  Culture, body fluid-bottle     Status: None   Collection Time: 12/28/18  3:05 PM  Result Value Ref Range Status   Specimen Description PERITONEAL  Final   Special Requests   Final    NONE Performed at [facility], [address] [place], [place] [address]    Culture NO GROWTH 5 DAYS  Final   Report Status 01/02/2019 FINAL  Final  Gram stain     Status: None   Collection Time:  12/28/18  3:05 PM  Result Value Ref Range Status   Specimen Description PERITONEAL  Final   Special Requests NONE  Final   Gram Stain   Final    RARE WBC PRESENT, PREDOMINANTLY MONONUCLEAR NO ORGANISMS SEEN Performed at [facility], [place] [address] [place], [place] [address]    Report Status 12/29/2018 FINAL  Final         Radiology Studies: No results found.      Scheduled Meds: . bisacodyl  10 mg Oral Daily  . bisacodyl  10 mg Rectal Once  . dexamethasone  20 mg Oral Once  . [START ON 01/04/2019] dexamethasone  20 mg Oral Once  . enoxaparin (LOVENOX) injection  1 mg/kg Subcutaneous Q12H  . ondansetron (ZOFRAN) IV  4 mg Intravenous Once  . pneumococcal 23 valent vaccine  0.5 mL Intramuscular Tomorrow-1000   Continuous Infusions:   LOS: 7 days     Georgette Shell, MD  [facility]  If 7PM-7AM, please contact night-coverage [email or web address] Password [facility] 01/03/2019, 10:47 AM

## 2019-01-04 ENCOUNTER — Telehealth: Payer: Self-pay | Admitting: Oncology

## 2019-01-04 LAB — BASIC METABOLIC PANEL WITH GFR
Anion gap: 9 (ref 5–15)
BUN: 26 mg/dL — ABNORMAL HIGH (ref 8–23)
CO2: 22 mmol/L (ref 22–32)
Calcium: 8.6 mg/dL — ABNORMAL LOW (ref 8.9–10.3)
Chloride: 105 mmol/L (ref 98–111)
Creatinine, Ser: 1.13 mg/dL — ABNORMAL HIGH (ref 0.44–1.00)
GFR calc Af Amer: 56 mL/min — ABNORMAL LOW
GFR calc non Af Amer: 48 mL/min — ABNORMAL LOW
Glucose, Bld: 154 mg/dL — ABNORMAL HIGH (ref 70–99)
Potassium: 4.8 mmol/L (ref 3.5–5.1)
Sodium: 136 mmol/L (ref 135–145)

## 2019-01-04 LAB — CBC
HCT: 35 % — ABNORMAL LOW (ref 36.0–46.0)
Hemoglobin: 11.3 g/dL — ABNORMAL LOW (ref 12.0–15.0)
MCH: 29.4 pg (ref 26.0–34.0)
MCHC: 32.3 g/dL (ref 30.0–36.0)
MCV: 90.9 fL (ref 80.0–100.0)
Platelets: 195 10*3/uL (ref 150–400)
RBC: 3.85 MIL/uL — ABNORMAL LOW (ref 3.87–5.11)
RDW: 13.5 % (ref 11.5–15.5)
WBC: 9.9 10*3/uL (ref 4.0–10.5)
nRBC: 0 % (ref 0.0–0.2)

## 2019-01-04 MED ORDER — DIPHENHYDRAMINE HCL 50 MG/ML IJ SOLN
50.0000 mg | Freq: Once | INTRAMUSCULAR | Status: AC
  Administered 2019-01-04: 50 mg via INTRAVENOUS
  Filled 2019-01-04: qty 1

## 2019-01-04 MED ORDER — SODIUM CHLORIDE 0.9 % IV SOLN
INTRAVENOUS | Status: DC | PRN
  Administered 2019-01-04: 13:00:00 via INTRAVENOUS

## 2019-01-04 MED ORDER — PALONOSETRON HCL INJECTION 0.25 MG/5ML
0.2500 mg | Freq: Once | INTRAVENOUS | Status: AC
  Administered 2019-01-04: 0.25 mg via INTRAVENOUS
  Filled 2019-01-04: qty 5

## 2019-01-04 MED ORDER — SODIUM CHLORIDE 0.9 % IV SOLN
Freq: Once | INTRAVENOUS | Status: AC
  Administered 2019-01-04: 13:00:00 via INTRAVENOUS
  Filled 2019-01-04: qty 5

## 2019-01-04 MED ORDER — SODIUM CHLORIDE 0.9 % IV SOLN
483.6000 mg | Freq: Once | INTRAVENOUS | Status: AC
  Administered 2019-01-04: 480 mg via INTRAVENOUS
  Filled 2019-01-04: qty 45

## 2019-01-04 MED ORDER — FAMOTIDINE IN NACL 20-0.9 MG/50ML-% IV SOLN
20.0000 mg | Freq: Once | INTRAVENOUS | Status: AC
  Administered 2019-01-04: 20 mg via INTRAVENOUS
  Filled 2019-01-04: qty 50

## 2019-01-04 MED ORDER — SODIUM CHLORIDE 0.9 % IV SOLN
175.0000 mg/m2 | Freq: Once | INTRAVENOUS | Status: AC
  Administered 2019-01-04: 342 mg via INTRAVENOUS
  Filled 2019-01-04: qty 50

## 2019-01-04 NOTE — Progress Notes (Signed)
Physical Therapy Treatment Patient Details Name: Haley Palmer MRN: [id number] DOB: 1946/10/09 Today's Date: 01/04/2019    History of Present Illness 73 year old female was admitted for abdominal and LLE swelling and pain.  Found to have LLE DVT and ovarian mass. s/p IVC filter 1/2    PT Comments    Progressing slowly with mobility. Pt reported L knee felt better during ambulation on today. Will need SNF for continued rehab.    Follow Up Recommendations  SNF     Equipment Recommendations  (TBD at next venue)    Recommendations for Other Services       Precautions / Restrictions Precautions Precautions: Fall Precaution Comments: distended abdomen Restrictions Weight Bearing Restrictions: No    Mobility  Bed Mobility Overal bed mobility: Needs Assistance Bed Mobility: Supine to Sit;Sit to Supine Rolling: Min assist Supine to sit: HOB elevated;Min assist Sit to supine: Min assist   General bed mobility comments: Assist for trunk and L LE. Assist to scoot to EOB. Increased time.   Transfers Overall transfer level: Needs assistance Equipment used: Rolling walker (2 wheeled) Transfers: Sit to/from Stand Sit to Stand: Min assist;From elevated surface Stand pivot transfers: Min assist       General transfer comment: x2. VCs safety, technique, hand placement. Assist to rise, stabilize, control descent. Increased time.   Ambulation/Gait Ambulation/Gait assistance: Min assist Gait Distance (Feet): 10 Feet(x2) Assistive device: Rolling walker (2 wheeled) Gait Pattern/deviations: Step-through pattern;Decreased stride length;Trunk flexed     General Gait Details: VCs safety, proper use of RW, posture. Slow gait speed. Assist to stabilize throughout distance.    Stairs             Wheelchair Mobility    Modified Rankin (Stroke Patients Only)       Balance Overall balance assessment: Needs assistance         Standing balance support: Bilateral upper  extremity supported Standing balance-Leahy Scale: Poor                              Cognition Arousal/Alertness: Awake/alert Behavior During Therapy: WFL for tasks assessed/performed Overall Cognitive Status: Within Functional Limits for tasks assessed                                        Exercises General Exercises - Lower Extremity Straight Leg Raises: AAROM;Left;5 reps;Supine    General Comments        Pertinent Vitals/Pain Pain Assessment: Faces Pain Score: 3  Faces Pain Scale: Hurts little more Pain Location: abdomen Pain Descriptors / Indicators: Discomfort Pain Intervention(s): Monitored during session;Repositioned    Home Living                      Prior Function            PT Goals (current goals can now be found in the care plan section) Progress towards PT goals: Progressing toward goals    Frequency    Min 2X/week      PT Plan Current plan remains appropriate    Co-evaluation              AM-PAC PT "6 Clicks" Mobility   Outcome Measure  Help needed turning from your back to your side while in a flat bed without using bedrails?: A Little Help needed moving from  lying on your back to sitting on the side of a flat bed without using bedrails?: A Lot Help needed moving to and from a bed to a chair (including a wheelchair)?: A Little Help needed standing up from a chair using your arms (e.g., wheelchair or bedside chair)?: A Little Help needed to walk in hospital room?: A Little Help needed climbing 3-5 steps with a railing? : Total 6 Click Score: 15    End of Session Equipment Utilized During Treatment: Gait belt Activity Tolerance: Patient limited by fatigue Patient left: in bed;with call bell/phone within reach;with bed alarm set   PT Visit Diagnosis: Muscle weakness (generalized) (M62.81);Difficulty in walking, not elsewhere classified (R26.2);Pain;Unsteadiness on feet (R26.81) Pain - Right/Left:  Left Pain - part of body: Leg     Time: 1400-1419 PT Time Calculation (min) (ACUTE ONLY): 19 min  Charges:  $Gait Training: 8-22 mins                        Weston Anna, PT Acute Rehabilitation Services Pager: [phone] Office: [phone]

## 2019-01-04 NOTE — Progress Notes (Signed)
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 The intent is of curative intent.  We discussed some of the risks, benefits, side-effects of carboplatin & Taxol. Treatment is intravenous, every 3 weeks x 6 cycles  Some of the short term side-effects included, though not limited to, including weight loss, life threatening infections, risk of allergic reactions, need for transfusions of blood products, nausea, vomiting, change in bowel habits, loss of hair, admission to hospital for various reasons, and risks of death.   Long term side-effects are also discussed including risks of infertility, permanent damage to nerve function, hearing loss, chronic fatigue, kidney damage with possibility needing hemodialysis, and rare secondary malignancy including bone marrow disorders.  The patient is aware that the response rates discussed earlier is not guaranteed.  After a long discussion, patient made an informed decision to proceed with the prescribed plan of care.   Patient education material was dispensed. We will proceed with chemotherapy as planned today  Left lower extremity DVT status post IVC filter placement She will continue treatment with Lovenox  Acute renal failure secondary to dehydration, improved with fluid resuscitation She will continue hydration as tolerated  Moderate to severe protein calorie malnutrition She needs nutritional consult  Anemia chronic illness She does not need further work-up or transfusion support.  This is likely due to anemia chronic illness  Significant debility with poor overall performance status Appreciate PT evaluation and skilled rehab facility placement for future discharge  Discharge planning Hopefully after chemotherapy is  completed  Heath Lark, MD 01/04/2019  7:26 AM   Subjective:  She is feeling well.  She felt that her abdomen is less distended.  She still struggle with constipation but had bowel movement with laxative.  Denies nausea.  Denies significant shortness of breath.  Her leg swelling is better and she complained of less pain. The patient denies any recent signs or symptoms of bleeding such as spontaneous epistaxis, hematuria or hematochezia.   Objective:  Vitals:   01/03/19 2122 01/04/19 0634  BP: 132/72 127/65  Pulse: 93 81  Resp: 16 16  Temp: 98.1 F (36.7 C) 97.6 F (36.4 C)  SpO2: 97% 95%     Intake/Output Summary (Last 24 hours) at 01/04/2019 0726 Last data filed at 01/04/2019 0256 Gross per 24 hour  Intake 600 ml  Output 1100 ml  Net -500 ml    GENERAL:alert, no distress and comfortable SKIN: skin color, texture, turgor are normal, no rashes or significant lesions EYES: normal, Conjunctiva are pink and non-injected, sclera clear OROPHARYNX:no exudate, no erythema and lips, buccal mucosa, and tongue normal  NECK: supple, thyroid normal size, non-tender, without nodularity LYMPH:  no palpable lymphadenopathy in the cervical, axillary or inguinal LUNGS: clear to auscultation and percussion with normal breathing effort HEART: regular rate & rhythm and no murmurs with bilateral lower extremity edema, left greater than the right ABDOMEN:abdomen soft, persistent distention with mild ascites Musculoskeletal:no cyanosis of digits and no clubbing  NEURO: alert & oriented x 3 with fluent speech, no focal motor/sensory deficits   Labs:  Lab Results  Component Value Date   WBC 9.9 01/04/2019   HGB 11.3 (L) 01/04/2019   HCT 35.0 (L) 01/04/2019   MCV 90.9 01/04/2019   PLT 195 01/04/2019   NEUTROABS 6.6 12/31/2018  Lab Results  Component Value Date   NA 136 01/04/2019   K 4.8 01/04/2019   CL 105 01/04/2019   CO2 22 01/04/2019    Studies:  No results found.

## 2019-01-04 NOTE — Progress Notes (Signed)
Taxol infusion complete. Patient tolerated infusion well with no adverse events noted.

## 2019-01-04 NOTE — Care Management Note (Signed)
Case Management Note  Patient Details  Name: Haley Palmer MRN: [id number] Date of Birth: 1946-02-17  Subjective/Objective:Ovarian mass, Ovarian ca. From home. Chemo.PT-recc SNF-CSW notified.                  Action/Plan:dc SNF.   Expected Discharge Date:                  Expected Discharge Plan:  Skilled Nursing Facility  In-House Referral:  Clinical Social Work  Discharge planning Services  CM Consult  Post Acute Care Choice:    Choice offered to:     DME Arranged:    DME Agency:     HH Arranged:    [place] Agency:     Status of Service:  In process, will continue to follow  If discussed at Long Length of Stay Meetings, dates discussed:    Additional Comments:  Dessa Phi, RN 01/04/2019, 12:21 PM

## 2019-01-04 NOTE — Care Management Important Message (Signed)
Important Message  Patient Details  Name: Komal Stangelo MRN: [id number] Date of Birth: Mar 05, 1946   Medicare Important Message Given:  Yes    Kerin Salen 01/04/2019, 10:54 [place] Message  Patient Details  Name: Zissy Hamlett MRN: [id number] Date of Birth: 04-07-1946   Medicare Important Message Given:  Yes    Kerin Salen 01/04/2019, 10:53 AM

## 2019-01-04 NOTE — Telephone Encounter (Signed)
Called Kim with [facility] Pathology regarding adding stains on to Accession: [id number].

## 2019-01-04 NOTE — Progress Notes (Signed)
PROGRESS NOTE    Haley Palmer  XAJ:[id number] DOB: 1946-05-08 DOA: 12/27/2018 PCP: Patient, No Pcp Per  Brief Narrative: 73 y.o.year old femalewith no significant medical history who presented on 12/29/2019with unintentional weight gain, enlarging abdomen, dyspnea on exertion for several months and new left leg swelling and pain over 2 days and was found to have ovarian mass with associated ascites and acute left leg DVT Assessment & Plan:   Active Problems:   Acute DVT (deep venous thrombosis) (HCC)   Generalized abdominal mass   AKI (acute kidney injury) ([place])   Malignant neoplasm of ovary (HCC)   #AcuteLeft leg DVT. IVC filter placed by interventional radiology 12/31/2018.Continue Lovenox for treatment of DVT. Patient to have inpatient chemotherapy started by Dr. Lurlean Leyden today  #Ascites malignant, Status post 5 L removed by paracentesis on 12/30and another 9.3 L removed 12/31/2018.dgnostic ascitic fluid analysisnegative for SBP.Patient unfortunately has recurrent ascites needing frequent paracentesis.   #Ovarian mass most likely locally advanced ovarian cancer for starting chemotherapy on Monday for 3 cycles followed by debulking surgery in the near future.Patient will follow-up with GYN as an outpatient to discuss about surgery.  #Leukocytosis.Resolved.   #AKI,improving. Seemsprerenal given diminished p.o. intakeand slow improvement with IVF. No obstruction on CT abdomen.   #Scattered pulmonary nodules throughout lungs bilaterally. Nonspecific, could be benign, will need follow-up imaging to ensure not metastatic disease  ConstipationResolved    Generalized deconditioning will obtain PT evaluation                  Estimated body mass index is 27.41 kg/m as calculated from the following:   Height as of this encounter: 5\' 7"  (1.702 m).   Weight as of this encounter: 79.4 kg.  DVT prophylaxis Lovenox Code Status: Full  code Family Communication: None Disposition Plan:. Pending clinical improvement  Consultants:  Oncology, interventional radiology  Procedures: IVC filter and paracentesis x2 Antimicrobials: None Subjective: Patient resting in bed complains of feeling hungry abdominal distention is lesser but still they are having bowel movements daily with Dulcolax.  Objective: Vitals:   01/03/19 0533 01/03/19 1346 01/03/19 2122 01/04/19 0634  BP: 130/63 139/62 132/72 127/65  Pulse: 82 89 93 81  Resp: 16 18 16 16   Temp: 98.5 F (36.9 C) 98 F (36.7 C) 98.1 F (36.7 C) 97.6 F (36.4 C)  TempSrc: Oral Oral Oral Oral  SpO2: 98% 99% 97% 95%  Weight:      Height:        Intake/Output Summary (Last 24 hours) at 01/04/2019 1206 Last data filed at 01/04/2019 0837 Gross per 24 hour  Intake 960 ml  Output 500 ml  Net 460 ml   Filed Weights   12/27/18 1050  Weight: 79.4 kg    Examination:  General exam: Appears calm and comfortable  Respiratory system: Clear to auscultation. Respiratory effort normal. Cardiovascular system: S1 & S2 heard, RRR. No JVD, murmurs, rubs, gallops or clicks. No pedal edema. Gastrointestinal system: Abdomen is distended, soft and nontender. No organomegaly or masses felt. Normal bowel sounds heard. Central nervous system: Alert and oriented. No focal neurological deficits. Extremities: Symmetric 5 x 5 power. Skin: No rashes, lesions or ulcers Psychiatry: Judgement and insight appear normal. Mood & affect appropriate.     Data Reviewed: I have personally reviewed following labs and imaging studies  CBC: Recent Labs  Lab 12/31/18 0541 01/01/19 0506 01/02/19 0518 01/03/19 0607 01/04/19 0524  WBC 9.2 11.2* 9.2 8.5 9.9  NEUTROABS 6.6  --   --   --   --  HGB 10.4* 10.5* 10.6* 10.3* 11.3*  HCT 32.5* 33.6* 33.6* 32.8* 35.0*  MCV 91.3 92.8 90.8 90.4 90.9  PLT 238 144* 142* 172 016   Basic Metabolic Panel: Recent Labs  Lab 12/30/18 0517 12/31/18 0541  01/02/19 0518 01/03/19 0607 01/04/19 0524  NA 138 139 137 137 136  K 4.4 4.4 4.2 4.1 4.8  CL 110 111 108 106 105  CO2 20* 19* 19* 21* 22  GLUCOSE 109* 102* 99 112* 154*  BUN 50* 38* 28* 27* 26*  CREATININE 1.59* 1.22* 1.13* 1.03* 1.13*  CALCIUM 8.5* 8.3* 8.2* 8.2* 8.6*   GFR: Estimated Creatinine Clearance: 48.1 mL/min (A) (by C-G formula based on SCr of 1.13 mg/dL (H)). Liver Function Tests: Recent Labs  Lab 12/31/18 0541  AST 35  ALT 26  ALKPHOS 56  BILITOT 0.6  PROT 5.0*  ALBUMIN 2.2*   No results for input(s): LIPASE, AMYLASE in the last 168 hours. No results for input(s): AMMONIA in the last 168 hours. Coagulation Profile: No results for input(s): INR, PROTIME in the last 168 hours. Cardiac Enzymes: No results for input(s): CKTOTAL, CKMB, CKMBINDEX, TROPONINI in the last 168 hours. BNP (last 3 results) No results for input(s): PROBNP in the last 8760 hours. HbA1C: No results for input(s): HGBA1C in the last 72 hours. CBG: No results for input(s): GLUCAP in the last 168 hours. Lipid Profile: No results for input(s): CHOL, HDL, LDLCALC, TRIG, CHOLHDL, LDLDIRECT in the last 72 hours. Thyroid Function Tests: No results for input(s): TSH, T4TOTAL, FREET4, T3FREE, THYROIDAB in the last 72 hours. Anemia Panel: No results for input(s): VITAMINB12, FOLATE, FERRITIN, TIBC, IRON, RETICCTPCT in the last 72 hours. Sepsis Labs: No results for input(s): PROCALCITON, LATICACIDVEN in the last 168 hours.  Recent Results (from the past 240 hour(s))  Culture, body fluid-bottle     Status: None   Collection Time: 12/28/18  3:05 PM  Result Value Ref Range Status   Specimen Description PERITONEAL  Final   Special Requests   Final    NONE Performed at [facility], [address], [place], [place] [address]    Culture NO GROWTH 5 DAYS  Final   Report Status 01/02/2019 FINAL  Final  Gram stain     Status: None   Collection Time: 12/28/18  3:05 PM  Result Value Ref Range  Status   Specimen Description PERITONEAL  Final   Special Requests NONE  Final   Gram Stain   Final    RARE WBC PRESENT, PREDOMINANTLY MONONUCLEAR NO ORGANISMS SEEN Performed at [facility], [place] [address] [place], [place] [address]    Report Status 12/29/2018 FINAL  Final         Radiology Studies: No results found.      Scheduled Meds: . bisacodyl  10 mg Oral Daily  . bisacodyl  10 mg Rectal Once  . CARBOplatin  480 mg Intravenous Once  . dexamethasone  20 mg Oral Once  . diphenhydrAMINE  50 mg Intravenous Once  . enoxaparin (LOVENOX) injection  1 mg/kg Subcutaneous Q12H  . ondansetron (ZOFRAN) IV  4 mg Intravenous Once  . PACLitaxel  175 mg/m2 (Treatment Plan Recorded) Intravenous Once  . palonosetron  0.25 mg Intravenous Once  . pneumococcal 23 valent vaccine  0.5 mL Intramuscular Tomorrow-1000   Continuous Infusions: . famotidine (PEPCID) IV (ONCOLOGY)    . fosaprepitant (EMEND) 150 mg + dexamethasone IV infusion       LOS: 8 days  Georgette Shell, MD [facility]  If 7PM-7AM, please contact night-coverage [email or web address] Password [email or web address] 01/04/2019, 12:06 PM

## 2019-01-04 NOTE — Progress Notes (Signed)
Taxol chemotherapy started per first dose guidelines. Will monitor pt for transfusion reaction per protocol.

## 2019-01-04 NOTE — Progress Notes (Signed)
Occupational Therapy Treatment Patient Details Name: Haley Palmer MRN: [id number] DOB: 26-Mar-1946 Today's Date: 01/04/2019    History of present illness 73 year old female was admitted for abdominal and LLE swelling and pain.  Found to have LLE DVT and ovarian mass. s/p IVC filter 1/2      Follow Up Recommendations  SNF    Equipment Recommendations  3 in 1 bedside commode    Recommendations for Other Services      Precautions / Restrictions Precautions Precautions: Fall Precaution Comments: distented abdomen; DVT LLE       Mobility Bed Mobility Overal bed mobility: Needs Assistance Bed Mobility: Supine to Sit Rolling: Min assist Sidelying to sit: Mod assist;Min assist       General bed mobility comments: Assist for trunk and L LE. Assist to scoot to EOB  Transfers Overall transfer level: Needs assistance Equipment used: Rolling walker (2 wheeled) Transfers: Sit to/from [employer] Transfers Sit to Stand: From elevated surface;Min assist Stand pivot transfers: Min assist                ADL either performed or assessed with clinical judgement   ADL Overall ADL's : Needs assistance/impaired     Grooming: Minimal assistance;Standing               Lower Body Dressing: Sit to/from stand;Maximal assistance;Cueing for sequencing;Cueing for safety   Toilet Transfer: Minimal assistance;BSC;Stand-pivot;Cueing for sequencing;Cueing for safety;RW   Toileting- Clothing Manipulation and Hygiene: Moderate assistance;Sit to/from stand;Cueing for sequencing;Cueing for safety                         Cognition Arousal/Alertness: Awake/alert Behavior During Therapy: WFL for tasks assessed/performed Overall Cognitive Status: Within Functional Limits for tasks assessed                                                     Pertinent Vitals/ Pain       Pain Score: 3  Pain Location: abdomen Pain Descriptors / Indicators:  Discomfort Pain Intervention(s): Repositioned;Monitored during session         Frequency  Min 2X/week        Progress Toward Goals  OT Goals(current goals can now be found in the care plan section)  Progress towards OT goals: Progressing toward goals     Plan Discharge plan remains appropriate       AM-PAC OT "6 Clicks" Daily Activity     Outcome Measure   Help from another person eating meals?: None Help from another person taking care of personal grooming?: A Little Help from another person toileting, which includes using toliet, bedpan, or urinal?: A Lot Help from another person bathing (including washing, rinsing, drying)?: A Lot Help from another person to put on and taking off regular upper body clothing?: A Lot Help from another person to put on and taking off regular lower body clothing?: A Lot 6 Click Score: 15    End of Session    OT Visit Diagnosis: Unsteadiness on feet (R26.81);Muscle weakness (generalized) (M62.81)   Activity Tolerance Patient tolerated treatment well   Patient Left in chair;with call bell/phone within reach   Nurse Communication Mobility status        Time: 5361-4431 OT Time Calculation (min): 22 min  Charges: OT General Charges $OT Visit: 1  Visit OT Treatments $Self Care/Home Management : 8-22 mins  Kari Baars, [place] [phone] Office- [phone], Thereasa Parkin 01/04/2019, 1:59 PM

## 2019-01-04 NOTE — Progress Notes (Signed)
   01/04/19 1603  Clinical Encounter Type  Visited With Patient  Visit Type Follow-up  Referral From Chaplain  Consult/Referral To Chaplain  The chaplain followed up with the Pt. per Pt. request.  The chaplain found the Pt. in good spirits after starting chemotherapy today.  The Pt. shared her phone connections with family and friends and a hopeful outlook for the future.  The Pt. accepted F/U from the chaplain with the promise for prayer.

## 2019-01-05 LAB — BASIC METABOLIC PANEL
Anion gap: 7 (ref 5–15)
BUN: 28 mg/dL — ABNORMAL HIGH (ref 8–23)
CO2: 23 mmol/L (ref 22–32)
Calcium: 8.1 mg/dL — ABNORMAL LOW (ref 8.9–10.3)
Chloride: 105 mmol/L (ref 98–111)
Creatinine, Ser: 1.02 mg/dL — ABNORMAL HIGH (ref 0.44–1.00)
GFR calc Af Amer: >60 mL/min (ref >60)
GFR calc non Af Amer: 55 mL/min — ABNORMAL LOW (ref >60)
Glucose, Bld: 168 mg/dL — ABNORMAL HIGH (ref 70–99)
Potassium: 4.5 mmol/L (ref 3.5–5.1)
SODIUM: 135 mmol/L (ref 135–145)

## 2019-01-05 LAB — CBC
HCT: 32.2 % — ABNORMAL LOW (ref 36.0–46.0)
Hemoglobin: 10.6 g/dL — ABNORMAL LOW (ref 12.0–15.0)
MCH: 29.4 pg (ref 26.0–34.0)
MCHC: 32.9 g/dL (ref 30.0–36.0)
MCV: 89.2 fL (ref 80.0–100.0)
Platelets: 242 10*3/uL (ref 150–400)
RBC: 3.61 MIL/uL — ABNORMAL LOW (ref 3.87–5.11)
RDW: 13.5 % (ref 11.5–15.5)
WBC: 19.8 10*3/uL — ABNORMAL HIGH (ref 4.0–10.5)
nRBC: 0 % (ref 0.0–0.2)

## 2019-01-05 MED ORDER — ENOXAPARIN SODIUM 80 MG/0.8ML ~~LOC~~ SOLN: 1.0000 mg/kg | Freq: Two times a day (BID) | SUBCUTANEOUS | 1 refills | Status: AC

## 2019-01-05 NOTE — Progress Notes (Signed)
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 Her next cycle of chemotherapy would be in 3 weeks  Left lower extremity DVT status post IVC filter placement She will continue treatment with Lovenox  Acute renal failure secondary to dehydration, improved with fluid resuscitation She will continue hydration as tolerated  Moderate to severe protein calorie malnutrition Continue nutritional supplement as tolerated  Anemia chronic illness She does not need further work-up or transfusion support. This is likely due to anemia chronic illness  Significant debility with poor overall performance status AppreciatePT evaluation and skilled rehab facility placement for future discharge  Discharge planning Will defer to primary service.  If she remains in the hospital next week, I will reorder blood count monitoring in case she becomes neutropenic.  We will follow daily.  Please call if questions arise  Heath Lark, MD 01/05/2019  11:51 AM   Subjective:  She tolerated chemotherapy very well without major side effects yesterday.  Today, her appetite is good.  She still has significant abdominal distention but it does not bother her.  She denies nausea or constipation.  Her leg swelling is about the same. The patient denies any recent signs or symptoms of bleeding such as spontaneous epistaxis, hematuria or hematochezia.   Objective:  Vitals:   01/04/19 2139 01/05/19 0604  BP: (!) 151/63 (!) 130/56  Pulse: 93 86  Resp: 16 16  Temp: 98.1 F (36.7 C) 97.9 F (36.6 C)  SpO2: 97% 98%     Intake/Output Summary (Last 24 hours) at 01/05/2019 1151 Last data filed at 01/05/2019 1015 Gross per 24 hour  Intake 1225.54 ml  Output -  Net 1225.54 ml    GENERAL:alert, no distress and  comfortable ABDOMEN: Distended, unchanged compared to previous exam Musculoskeletal:no cyanosis of digits and no clubbing.  Her left lower extremity edema is stable NEURO: alert & oriented x 3 with fluent speech, no focal motor/sensory deficits   Labs:  Lab Results  Component Value Date   WBC 19.8 (H) 01/05/2019   HGB 10.6 (L) 01/05/2019   HCT 32.2 (L) 01/05/2019   MCV 89.2 01/05/2019   PLT 242 01/05/2019   NEUTROABS 6.6 12/31/2018    Lab Results  Component Value Date   NA 135 01/05/2019   K 4.5 01/05/2019   CL 105 01/05/2019   CO2 23 01/05/2019    Studies:  No results found.

## 2019-01-05 NOTE — Progress Notes (Signed)
OT Cancellation Note  Patient Details Name: Haley Palmer MRN: [id number] DOB: 1946/06/22   Cancelled Treatment:    Reason Eval/Treat Not Completed: Other (comment). Pt plans SNF later today; she doesn't feel up to getting up at this time.  Peni Rupard 01/05/2019, 3:24 PM  Lesle Chris, OTR/L Acute Rehabilitation Services [phone] [facility] pager [phone] office 01/05/2019

## 2019-01-05 NOTE — Progress Notes (Signed)
CSW provide patient with list of SNF facilities and facilities that are able to accept patient. Patient reviewing list and will contact CSW.   CSW will continue to follow up.   Haley Palmer, [place]  [phone]

## 2019-01-05 NOTE — Discharge Summary (Addendum)
Physician Discharge Summary  Anapaula Severt UDJ:[id number] DOB: 27-Dec-1946 DOA: 12/27/2018  PCP: Patient, No Pcp Per  Admit date: 12/27/2018 Discharge date: 01/06/2019 Admitted From: Home Disposition: SNF Recommendations for Outpatient Follow-up:  1. Follow up with PCP in 1-2 weeks 2. Please obtain BMP/CBC in one week 3. Please follow-up with Dr. Alvy Bimler 4. Please follow-up with GYN women's health clinic Dr. Harrington Challenger  Home Health: None Equipment/Devices none  Discharge Condition stable CODE STATUS: Full code Diet recommendation cardiac Brief/Interim Summary:73 y.o.year old femalewith no significant medical history who presented on 12/29/2019with unintentional weight gain, enlarging abdomen, dyspnea on exertion for several months and new left leg swelling and pain over 2 days and was found to have ovarian mass with associated ascites and acute left leg DVT Discharge Diagnoses:  Active Problems:   Acute DVT (deep venous thrombosis) (HCC)   Generalized abdominal mass   AKI (acute kidney injury) ([place])   Malignant neoplasm of ovary (HCC)   #AcuteLeft leg DVT. IVC filter placed by interventional radiology 12/31/2018.Continue Lovenox for treatment DVT at the facility.there is no stop date for lovenox at this time.it will be determined by dr Lurlean Leyden.she is s/p chemo.  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   #Ovarian mass most likely locally advanced ovarian cancer for starting chemotherapy on Monday for a total of 3 cycles followed by debulking surgery in the near future.  She has received her first cycle on January 04, 2019.  Her next cycle of chemotherapy would be in 3 weeks. Pent will follow-up with GYN as an outpatient to discuss about surgery.  #Leukocytosis.Resolved.   #AKI,improving.  Seemsprerenal given diminished p.o. intakeand slow improvement with IVF. No obstruction on CT abdomen.   #Scattered pulmonary nodules throughout lungs bilaterally. Nonspecific, could be benign, will need follow-up imaging to ensure not metastatic disease  ConstipationResolved  01/06/2019 patient is stable to dc 1/9/2020patient is stable to dc 01/08/2019 patient will have paracentesis today and then can be discharged to SNF.  Estimated body mass index is 27.41 kg/m as calculated from the following:   Height as of this encounter: 5\' 7"  (1.702 m).   Weight as of this encounter: 79.4 kg.  Discharge Instructions   Allergies as of 01/05/2019      Reactions   Penicillins Rash   DID THE REACTION INVOLVE: Swelling of the face/tongue/throat, SOB, or low BP? No Sudden or severe rash/hives, skin peeling, or the inside of the mouth or nose? No Did it require medical treatment? No When did it last happen?childhood allergy If all above answers are "NO", may proceed with cephalosporin use.      Medication List    TAKE these medications   bismuth subsalicylate 588 FO/27XA suspension Commonly known as:  PEPTO BISMOL Take 30 mLs by mouth every 4 (four) hours as needed for indigestion.       Allergies  Allergen Reactions  . Penicillins Rash    DID THE REACTION INVOLVE: Swelling of the face/tongue/throat, SOB, or low BP? No Sudden or severe rash/hives, skin peeling, or the inside of the mouth or nose? No Did it require medical treatment? No When did it last happen?childhood allergy If all above answers are "NO", may proceed with cephalosporin use.     Consultations:  Renal, oncology, GYN oncology   Procedures/Studies: Ct Abdomen Pelvis Wo Contrast  Result Date: 12/27/2018 CLINICAL DATA:  73 year old female with  history of abdominal distension. Evaluate for potential mass. EXAM: CT CHEST, ABDOMEN AND PELVIS WITHOUT CONTRAST TECHNIQUE: Multidetector CT imaging of  the chest, abdomen and pelvis was performed following the standard protocol without IV contrast. COMPARISON:  None. FINDINGS: CT CHEST FINDINGS Cardiovascular: Heart size is normal. There is no significant pericardial fluid, thickening or pericardial calcification. Atherosclerotic calcifications in the left anterior descending coronary artery. Mediastinum/Nodes: No pathologically enlarged mediastinal or hilar lymph nodes. Moderate-sized hiatal hernia. No axillary lymphadenopathy. Lungs/Pleura: Mild diffuse bronchial wall thickening with mild centrilobular and paraseptal emphysema. Innumerable tiny 1-3 mm pulmonary nodules scattered throughout the periphery of the lungs bilaterally, nonspecific. No larger more suspicious appearing pulmonary nodules or masses are noted. No acute consolidative airspace disease. No pleural effusions. Areas of linear scarring noted in the right middle lobe, inferior segment of the lingula and anterior aspect of the left lower lobe. Musculoskeletal: There are no aggressive appearing lytic or blastic lesions noted in the visualized portions of the skeleton. CT ABDOMEN PELVIS FINDINGS Hepatobiliary: No definite cystic or solid hepatic lesions are confidently identified on today's noncontrast CT examination. Gallbladder is not confidently identified may be surgically absent, or may be obscured by adjacent ascites. Pancreas: No definite pancreatic mass noted on today's noncontrast CT examination. No definite surrounding peripancreatic inflammatory changes. Spleen: Unremarkable. Adrenals/Urinary Tract: Unenhanced appearance of the kidneys and bilateral adrenal glands is normal. No hydroureteronephrosis. Urinary bladder is poorly demonstrated, but unremarkable in appearance. Stomach/Bowel: Normal appearance of the stomach. No pathologic dilatation of small bowel or colon. Appendix is not confidently identified. Vascular/Lymphatic: Aortic atherosclerosis. No lymphadenopathy identified in the  abdomen or pelvis on today's noncontrast CT examination. Reproductive: Unenhanced appearance of the uterus is unremarkable. In the central abdomen and pelvis there is a very large centrally low-attenuation rim enhancing mass (axial image 85 of series 2 and sagittal image 92 of series 7) measuring 28.7 x 25.6 x 25.6 cm. This appears potentially related to the right adnexal region, although the origin of this lesion is uncertain. Left ovary is not confidently identified. Other: Large volume of ascites.  No pneumoperitoneum. Musculoskeletal: There are no aggressive appearing lytic or blastic lesions noted in the visualized portions of the skeleton. IMPRESSION: 1. 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 This is associated with a very large volume of (presumably malignant) ascites. 2. Multiple tiny 1-3 mm pulmonary nodules scattered throughout the lungs bilaterally. These are nonspecific, but favored to represent benign areas of mucoid impaction within terminal bronchioles. However, close attention on follow-up studies is recommended to ensure the stability or resolution of this finding, as the possibility of metastatic disease is not excluded. 3. Aortic atherosclerosis, in addition to left anterior descending coronary artery disease. Assessment for potential risk factor modification, dietary therapy or pharmacologic therapy may be warranted, if clinically indicated. Electronically Signed   By: Vinnie Langton M.D.   On: 12/27/2018 13:41   Ct Chest Wo Contrast  Result Date: 12/27/2018 CLINICAL DATA:  73 year old female with history of abdominal distension. Evaluate for potential mass. EXAM: CT CHEST, ABDOMEN AND PELVIS WITHOUT CONTRAST TECHNIQUE: Multidetector CT imaging of the chest, abdomen and pelvis was performed following the standard protocol without IV contrast. COMPARISON:  None. FINDINGS: CT CHEST  FINDINGS Cardiovascular: Heart size is normal. There is no significant pericardial fluid, thickening or pericardial calcification. Atherosclerotic calcifications in the left anterior descending coronary artery. Mediastinum/Nodes: No pathologically enlarged mediastinal  or hilar lymph nodes. Moderate-sized hiatal hernia. No axillary lymphadenopathy. Lungs/Pleura: Mild diffuse bronchial wall thickening with mild centrilobular and paraseptal emphysema. Innumerable tiny 1-3 mm pulmonary nodules scattered throughout the periphery of the lungs bilaterally, nonspecific. No larger more suspicious appearing pulmonary nodules or masses are noted. No acute consolidative airspace disease. No pleural effusions. Areas of linear scarring noted in the right middle lobe, inferior segment of the lingula and anterior aspect of the left lower lobe. Musculoskeletal: There are no aggressive appearing lytic or blastic lesions noted in the visualized portions of the skeleton. CT ABDOMEN PELVIS FINDINGS Hepatobiliary: No definite cystic or solid hepatic lesions are confidently identified on today's noncontrast CT examination. Gallbladder is not confidently identified may be surgically absent, or may be obscured by adjacent ascites. Pancreas: No definite pancreatic mass noted on today's noncontrast CT examination. No definite surrounding peripancreatic inflammatory changes. Spleen: Unremarkable. Adrenals/Urinary Tract: Unenhanced appearance of the kidneys and bilateral adrenal glands is normal. No hydroureteronephrosis. Urinary bladder is poorly demonstrated, but unremarkable in appearance. Stomach/Bowel: Normal appearance of the stomach. No pathologic dilatation of small bowel or colon. Appendix is not confidently identified. Vascular/Lymphatic: Aortic atherosclerosis. No lymphadenopathy identified in the abdomen or pelvis on today's noncontrast CT examination. Reproductive: Unenhanced appearance of the uterus is unremarkable. In the central  abdomen and pelvis there is a very large centrally low-attenuation rim enhancing mass (axial image 85 of series 2 and sagittal image 92 of series 7) measuring 28.7 x 25.6 x 25.6 cm. This appears potentially related to the right adnexal region, although the origin of this lesion is uncertain. Left ovary is not confidently identified. Other: Large volume of ascites.  No pneumoperitoneum. Musculoskeletal: There are no aggressive appearing lytic or blastic lesions noted in the visualized portions of the skeleton. IMPRESSION: 1. 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 This is associated with a very large volume of (presumably malignant) ascites. 2. Multiple tiny 1-3 mm pulmonary nodules scattered throughout the lungs bilaterally. These are nonspecific, but favored to represent benign areas of mucoid impaction within terminal bronchioles. However, close attention on follow-up studies is recommended to ensure the stability or resolution of this finding, as the possibility of metastatic disease is not excluded. 3. Aortic atherosclerosis, in addition to left anterior descending coronary artery disease. Assessment for potential risk factor modification, dietary therapy or pharmacologic therapy may be warranted, if clinically indicated. Electronically Signed   By: Vinnie Langton M.D.   On: 12/27/2018 13:41   Ir Ivc Filter Plmt / S&i /img Guid/mod Sed  Result Date: 12/31/2018 INDICATION: 73 year old with a large central abdominal and pelvic mass. EXAM: IVC FILTER PLACEMENT; IVC VENOGRAM; ULTRASOUND FOR VASCULAR ACCESS Physician: Stephan Minister. Anselm Pancoast, MD MEDICATIONS: None. ANESTHESIA/SEDATION: Fentanyl 100 mcg IV; Versed 2.0 mg IV Moderate Sedation Time:  26 minutes minutes The patient was continuously monitored during the procedure by the interventional radiology nurse under my direct supervision. CONTRAST:  Carbon dioxide  FLUOROSCOPY TIME:  Fluoroscopy Time: 4 minutes 48 seconds (437 mGy). COMPLICATIONS: None immediate. PROCEDURE: The procedure was explained to the patient. The risks and benefits of the procedure were discussed and the patient's questions were addressed. Informed consent was obtained from the patient. Ultrasound demonstrated a patent right internal jugular vein. Ultrasound images were obtained for documentation. The right side of the neck was prepped and draped in a sterile fashion. Maximal barrier sterile technique was utilized including caps,  mask, sterile gowns, sterile gloves, sterile drape, hand hygiene and skin antiseptic. The skin was anesthetized with 1% lidocaine. A 21 gauge needle was directed into the vein with ultrasound guidance and a micropuncture dilator set was placed. A wire was advanced into the IVC. The filter sheath was advanced over the wire into the IVC. An IVC venogram was performed with carbon dioxide due to renal insufficiency. Fluoroscopic images were obtained for documentation. Renal veins were not confidently identified with carbon dioxide. Therefore, 5 French catheter and Bentson wire were used to cannulate the bilateral renal veins. A Bard Denali filter was deployed below the lowest renal vein. Left renal vein was again cannulated with a catheter and wire to ensure placement below the renal vein. Final spot radiograph images were obtained. Vascular sheath was removed with manual compression. Bandage placed over the puncture site. FINDINGS: IVC is small but patent. Bilateral renal veins were identified by cannulating the veins with a catheter and wire. The filter was deployed below the lowest renal vein. IMPRESSION: Successful placement of a retrievable IVC filter. PLAN: This IVC filter is potentially retrievable. The patient will be assessed for filter retrieval by Interventional Radiology in approximately 8-12 weeks. Further recommendations regarding filter retrieval, continued  surveillance or declaration of device permanence, will be made at that time. Electronically Signed   By: Markus Daft M.D.   On: 12/31/2018 13:28   US Paracentesis  Result Date: 01/01/2019 INDICATION: Patient with history of metastatic adenocarcinoma, abdominopelvic mass, recurrent malignant ascites. Request made for therapeutic paracentesis. EXAM: ULTRASOUND GUIDED THERAPEUTIC PARACENTESIS MEDICATIONS: None COMPLICATIONS: None immediate. PROCEDURE: Informed written consent was obtained from the patient after a discussion of the risks, benefits and alternatives to treatment. A timeout was performed prior to the initiation of the procedure. Initial ultrasound scanning demonstrates a large amount of ascites within the left lower abdominal quadrant. The left lower abdomen was prepped and draped in the usual sterile fashion. 1% lidocaine was used for local anesthesia. Following this, a 19 gauge, 10-cm, Yueh catheter was introduced. An ultrasound image was saved for documentation purposes. The paracentesis was performed. The catheter was removed and a dressing was applied. The patient tolerated the procedure well without immediate post procedural complication. FINDINGS: A total of approximately 9.3 liters of slightly hazy, yellow fluid was removed. IMPRESSION: Successful ultrasound-guided therapeutic paracentesis yielding 9.3 liters of peritoneal fluid. Read by: Rowe Robert, PA-C Electronically Signed   By: Markus Daft M.D.   On: 12/31/2018 17:38   US Paracentesis  Result Date: 12/28/2018 INDICATION: Patient with history of lower extremity DVT, abdomino-pelvic mass, renal insufficiency, ascites ; request made for diagnostic and therapeutic paracentesis up to 5 liters. EXAM: ULTRASOUND GUIDED DIAGNOSTIC AND THERAPEUTIC PARACENTESIS MEDICATIONS: None COMPLICATIONS: None immediate. PROCEDURE: Informed written consent was obtained from the patient after a discussion of the risks, benefits and alternatives to treatment.  A timeout was performed prior to the initiation of the procedure. Initial ultrasound scanning demonstrates a large amount of ascites within the left mid to lower abdominal quadrant. The left mid to lower abdomen was prepped and draped in the usual sterile fashion. 1% lidocaine was used for local anesthesia. Following this, a 19 gauge, 7-cm, Yueh catheter was introduced. An ultrasound image was saved for documentation purposes. The paracentesis was performed. The catheter was removed and a dressing was applied. The patient tolerated the procedure well without immediate post procedural complication. FINDINGS: A total of approximately 5 liters of slightly hazy, yellow fluid was removed. Samples were sent  to the laboratory as requested by the clinical team. IMPRESSION: Successful ultrasound-guided diagnostic and therapeutic paracentesis yielding 5 liters of peritoneal fluid. Read by: Rowe Robert, PA-C Electronically Signed   By: Sandi Mariscal M.D.   On: 12/28/2018 15:45   Dg Chest Portable 1 View  Result Date: 12/27/2018 CLINICAL DATA:  Abdominal distension and leg swelling EXAM: PORTABLE CHEST 1 VIEW COMPARISON:  None. FINDINGS: Monitoring leads overlie the patient. Normal cardiac and mediastinal contours. No consolidative pulmonary opacities. No pleural effusion or pneumothorax. Thoracic spine degenerative changes. IMPRESSION: No active disease. Electronically Signed   By: Lovey Newcomer M.D.   On: 12/27/2018 11:48   Vas [place] Lower Extremity Venous (dvt) (only [facility])  Result Date: 12/29/2018  Lower Venous Study Indications: Pain, Swelling, and Edema.  Limitations: Body habitus and tightenedness due to edema. Comparison Study: No prior study available. Performing Technologist: Lorina Rabon  Examination Guidelines: A complete evaluation includes B-mode imaging, spectral Doppler, color Doppler, and power Doppler as needed of all accessible portions of each vessel. Bilateral testing is considered an integral part  of a complete examination. Limited examinations for reoccurring indications may be performed as noted.  Right Venous Findings: +---+---------------+---------+-----------+----------+-------+    CompressibilityPhasicitySpontaneityPropertiesSummary +---+---------------+---------+-----------+----------+-------+ CFVFull           Yes      Yes                          +---+---------------+---------+-----------+----------+-------+  Left Venous Findings: +---------+---------------+---------+-----------+----------+-----------------+          CompressibilityPhasicitySpontaneityPropertiesSummary           +---------+---------------+---------+-----------+----------+-----------------+ CFV      None           No       No                   Acute             +---------+---------------+---------+-----------+----------+-----------------+ SFJ      None                                         Acute             +---------+---------------+---------+-----------+----------+-----------------+ FV Prox  None                                         Acute             +---------+---------------+---------+-----------+----------+-----------------+ FV Mid   None                                         Acute             +---------+---------------+---------+-----------+----------+-----------------+ FV DistalNone                                         Acute             +---------+---------------+---------+-----------+----------+-----------------+ PFV      None  Age Indeterminate +---------+---------------+---------+-----------+----------+-----------------+ POP      None                                         Age Indeterminate +---------+---------------+---------+-----------+----------+-----------------+ PTV      None                                         Age Indeterminate  +---------+---------------+---------+-----------+----------+-----------------+ PERO     None                                         Age Indeterminate +---------+---------------+---------+-----------+----------+-----------------+ GSV      None                                         Acute             +---------+---------------+---------+-----------+----------+-----------------+ EIV      None           No                            Acute             +---------+---------------+---------+-----------+----------+-----------------+  Summary: Right: No evidence of common femoral vein obstruction. Left: Findings consistent with acute deep vein thrombosis involving the left common femoral vein, and left femoral vein. Findings consistent with acute superficial vein thrombosis involving the left great saphenous vein. Findings consistent with age indeterminate deep vein thrombosis involving the left proximal profunda vein, left popliteal vein, left posterior tibial vein, and left peroneal vein. Left external iliac vein appears thrombosied. IVC was not viualized. No cystic structure found in the popliteal fossa.  *See table(s) above for measurements and observations. Electronically signed by Deitra Mayo MD on 12/29/2018 at 6:38:59 AM.    Final     (Echo, Carotid, EGD, Colonoscopy, ERCP)    Subjective:   Discharge Exam: Vitals:   01/04/19 2139 01/05/19 0604  BP: (!) 151/63 (!) 130/56  Pulse: 93 86  Resp: 16 16  Temp: 98.1 F (36.7 C) 97.9 F (36.6 C)  SpO2: 97% 98%   Vitals:   01/04/19 1540 01/04/19 1600 01/04/19 2139 01/05/19 0604  BP: (!) 173/73 (!) 156/73 (!) 151/63 (!) 130/56  Pulse: 87 82 93 86  Resp: 15 17 16 16   Temp: 97.9 F (36.6 C) 97.6 F (36.4 C) 98.1 F (36.7 C) 97.9 F (36.6 C)  TempSrc: Oral Oral Oral Oral  SpO2: 100% 97% 97% 98%  Weight:      Height:        General: Pt is alert, awake, not in acute distress Cardiovascular: RRR, S1/S2 +, no rubs, no  gallops Respiratory: CTA bilaterally, no wheezing, no rhonchi Abdominal: Soft, NT, ND, bowel sounds + Extremities: no edema, no cyanosis    The results of significant diagnostics from this hospitalization (including imaging, microbiology, ancillary and laboratory) are listed below for reference.     Microbiology: Recent Results (from the past 240 hour(s))  Culture, body fluid-bottle     Status: None   Collection Time: 12/28/18  3:05  PM  Result Value Ref Range Status   Specimen Description PERITONEAL  Final   Special Requests   Final    NONE Performed at [facility], [place] [address], [place], [place] [address]    Culture NO GROWTH 5 DAYS  Final   Report Status 01/02/2019 FINAL  Final  Gram stain     Status: None   Collection Time: 12/28/18  3:05 PM  Result Value Ref Range Status   Specimen Description PERITONEAL  Final   Special Requests NONE  Final   Gram Stain   Final    RARE WBC PRESENT, PREDOMINANTLY MONONUCLEAR NO ORGANISMS SEEN Performed at [facility], [place] [address] [place], [place] [address]    Report Status 12/29/2018 FINAL  Final     Labs: BNP (last 3 results) No results for input(s): BNP in the last 8760 hours. Basic Metabolic Panel: Recent Labs  Lab 12/31/18 0541 01/02/19 0518 01/03/19 0607 01/04/19 0524 01/05/19 0337  NA 139 137 137 136 135  K 4.4 4.2 4.1 4.8 4.5  CL 111 108 106 105 105  CO2 19* 19* 21* 22 23  GLUCOSE 102* 99 112* 154* 168*  BUN 38* 28* 27* 26* 28*  CREATININE 1.22* 1.13* 1.03* 1.13* 1.02*  CALCIUM 8.3* 8.2* 8.2* 8.6* 8.1*   Liver Function Tests: Recent Labs  Lab 12/31/18 0541  AST 35  ALT 26  ALKPHOS 56  BILITOT 0.6  PROT 5.0*  ALBUMIN 2.2*   No results for input(s): LIPASE, AMYLASE in the last 168 hours. No results for input(s): AMMONIA in the last 168 hours. CBC: Recent Labs  Lab 12/31/18 0541 01/01/19 0506 01/02/19 0518 01/03/19 0607 01/04/19 0524 01/05/19 0337  WBC 9.2 11.2* 9.2 8.5 9.9 19.8*   NEUTROABS 6.6  --   --   --   --   --   HGB 10.4* 10.5* 10.6* 10.3* 11.3* 10.6*  HCT 32.5* 33.6* 33.6* 32.8* 35.0* 32.2*  MCV 91.3 92.8 90.8 90.4 90.9 89.2  PLT 238 144* 142* 172 195 242   Cardiac Enzymes: No results for input(s): CKTOTAL, CKMB, CKMBINDEX, TROPONINI in the last 168 hours. BNP: Invalid input(s): POCBNP CBG: No results for input(s): GLUCAP in the last 168 hours. D-Dimer No results for input(s): DDIMER in the last 72 hours. Hgb A1c No results for input(s): HGBA1C in the last 72 hours. Lipid Profile No results for input(s): CHOL, HDL, LDLCALC, TRIG, CHOLHDL, LDLDIRECT in the last 72 hours. Thyroid function studies No results for input(s): TSH, T4TOTAL, T3FREE, THYROIDAB in the last 72 hours.  Invalid input(s): FREET3 Anemia work up No results for input(s): VITAMINB12, FOLATE, FERRITIN, TIBC, IRON, RETICCTPCT in the last 72 hours. Urinalysis    Component Value Date/Time   COLORURINE AMBER (A) 12/28/2018 0527   APPEARANCEUR CLOUDY (A) 12/28/2018 0527   LABSPEC 1.019 12/28/2018 0527   PHURINE 5.0 12/28/2018 0527   GLUCOSEU NEGATIVE 12/28/2018 0527   HGBUR MODERATE (A) 12/28/2018 0527   BILIRUBINUR NEGATIVE 12/28/2018 0527   KETONESUR 5 (A) 12/28/2018 0527   PROTEINUR 30 (A) 12/28/2018 0527   NITRITE NEGATIVE 12/28/2018 0527   LEUKOCYTESUR TRACE (A) 12/28/2018 0527   Sepsis Labs Invalid input(s): PROCALCITONIN,  WBC,  LACTICIDVEN Microbiology Recent Results (from the past 240 hour(s))  Culture, body fluid-bottle     Status: None   Collection Time: 12/28/18  3:05 PM  Result Value Ref Range Status   Specimen Description PERITONEAL  Final   Special Requests   Final  NONE Performed at [facility], [place] [address] [place], [place] [address]    Culture NO GROWTH 5 DAYS  Final   Report Status 01/02/2019 FINAL  Final  Gram stain     Status: None   Collection Time: 12/28/18  3:05 PM  Result Value Ref Range Status   Specimen Description PERITONEAL   Final   Special Requests NONE  Final   Gram Stain   Final    RARE WBC PRESENT, PREDOMINANTLY MONONUCLEAR NO ORGANISMS SEEN Performed at [facility], [place] [address], [place], [place] [address]    Report Status 12/29/2018 FINAL  Final     Time coordinating discharge: 34 minutes  SIGNED:   Georgette Shell, MD  [facility] 01/05/2019, 12:26 PM Pager   If 7PM-7AM, please contact night-coverage [email or web address] Password [email or web address]

## 2019-01-06 ENCOUNTER — Encounter: Payer: Self-pay | Admitting: Oncology

## 2019-01-06 NOTE — Progress Notes (Signed)
Physical Therapy Treatment Patient Details Name: Haley Palmer MRN: [id number] DOB: [birth year] Today's Date: 01/06/2019    History of Present Illness 73 year old female was admitted for abdominal and LLE swelling and pain.  Found to have LLE DVT and ovarian mass. s/p IVC filter 1/2, on lovenox for DVT.     PT Comments    Pt with improved ambulation distance today, required steadying assist and verbal cuing for safe use of RW. Pt limited by fatigue and abdominal and well as LLE pain. PT continuing to recommend SNF placement to address deficits. PT to continue to follow acutely.    Follow Up Recommendations  SNF     Equipment Recommendations  Other (comment)(defer to next venue)    Recommendations for Other Services       Precautions / Restrictions Precautions Precautions: Fall Precaution Comments: distended abdomen Restrictions Weight Bearing Restrictions: No    Mobility  Bed Mobility Overal bed mobility: Needs Assistance Bed Mobility: Rolling;Sidelying to Sit Rolling: Min assist Sidelying to sit: Min assist;+2 for safety/equipment;HOB elevated       General bed mobility comments: Assist for completion of roll towards R side of bed, trunk elevation. Pt assisting with trunk elevation by using UEs to power up.   Transfers Overall transfer level: Needs assistance Equipment used: Rolling walker (2 wheeled) Transfers: Sit to/from Stand Sit to Stand: Min assist;From elevated surface Stand pivot transfers: Min assist       General transfer comment: Min assist for steadying upon standing, pt able to power up with increased time and effort.   Ambulation/Gait Ambulation/Gait assistance: Min assist;Min guard;+2 safety/equipment(chair follow) Gait Distance (Feet): 100 Feet Assistive device: Rolling walker (2 wheeled) Gait Pattern/deviations: Step-through pattern;Decreased stride length;Trunk flexed Gait velocity: decr    General Gait Details: Min guard for safety,  occasional min assist for steadying. Verbal cuing for placement in RW. Pt reporting fatigue at 50 ft ambulation, but deferred stopping and resting in recliner following behind. Dyspnea 2/4 with fatigue.   Stairs             Wheelchair Mobility    Modified Rankin (Stroke Patients Only)       Balance Overall balance assessment: Needs assistance Sitting-balance support: Bilateral upper extremity supported Sitting balance-Leahy Scale: Fair     Standing balance support: Bilateral upper extremity supported Standing balance-Leahy Scale: Poor Standing balance comment: relies on RW for support                            Cognition Arousal/Alertness: Awake/alert Behavior During Therapy: WFL for tasks assessed/performed Overall Cognitive Status: Within Functional Limits for tasks assessed                                        Exercises General Exercises - Lower Extremity Quad Sets: AROM;Left;5 reps;Supine Heel Slides: AAROM;Left;10 reps;Supine    General Comments        Pertinent Vitals/Pain Pain Assessment: Faces Faces Pain Scale: Hurts even more Pain Location: abdomen and LLE with movement Pain Descriptors / Indicators: Discomfort Pain Intervention(s): Limited activity within patient's tolerance;Repositioned;Monitored during session    Home Living                      Prior Function            PT Goals (current goals can now be  found in the care plan section) Acute Rehab PT Goals Patient Stated Goal: return to independence; decreased pain/swelling PT Goal Formulation: With patient Time For Goal Achievement: 01/12/19 Potential to Achieve Goals: Fair Progress towards PT goals: Progressing toward goals    Frequency    Min 2X/week      PT Plan Current plan remains appropriate    Co-evaluation              AM-PAC PT "6 Clicks" Mobility   Outcome Measure  Help needed turning from your back to your side while  in a flat bed without using bedrails?: A Little Help needed moving from lying on your back to sitting on the side of a flat bed without using bedrails?: A Little Help needed moving to and from a bed to a chair (including a wheelchair)?: A Little Help needed standing up from a chair using your arms (e.g., wheelchair or bedside chair)?: A Little Help needed to walk in hospital room?: A Little Help needed climbing 3-5 steps with a railing? : Total 6 Click Score: 16    End of Session Equipment Utilized During Treatment: Gait belt Activity Tolerance: Patient limited by fatigue Patient left: with call bell/phone within reach;in chair;with chair alarm set Nurse Communication: Mobility status PT Visit Diagnosis: Muscle weakness (generalized) (M62.81);Difficulty in walking, not elsewhere classified (R26.2)     Time: 1683-7290 PT Time Calculation (min) (ACUTE ONLY): 17 min  Charges:  $Gait Training: 8-22 mins                     Julien Girt, PT Acute Rehabilitation Services Pager [phone]  Office [phone]   Kaisha Wachob D Elonda Husky 01/06/2019, 1:45 PM

## 2019-01-06 NOTE — Progress Notes (Signed)
Notified Haley Palmer of apts scheduled on 01/21/19 and infusion on 01/25/19.  She verbalized agreement.

## 2019-01-06 NOTE — Progress Notes (Signed)
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 Her next cycle of chemotherapy would be in 3 weeks, will schedule that in the outpatient clinic  Left lower extremity DVT status post IVC filter placement She will continue treatment with Lovenox. If not possible, OK to switch to DOAC  Acute renal failure secondary to dehydration, resolved She will continue hydration as tolerated  Mild constipation Recommend regular laxatives  Moderate to severe protein calorie malnutrition Continue nutritional supplement as tolerated  Anemia chronic illness She does not need further work-up or transfusion support. This is likely due to anemia chronic illness  Significant debility with poor overall performance status AppreciatePT evaluation and skilled rehab facility placement for future discharge  Discharge planning Hopefully SNF today I will arrange outpatient follow-up Will sign off. Call if questions arise  Heath Lark, MD 01/06/2019  7:44 AM   Subjective:  She feels well.  Denies nausea.  Her leg swelling has improved with less pain. The patient denies any recent signs or symptoms of bleeding such as spontaneous epistaxis, hematuria or hematochezia.   Objective:  Vitals:   01/05/19 2135 01/06/19 0532  BP: (!) 146/72 129/68  Pulse: 91 73  Resp: 18 16  Temp: 97.7 F (36.5 C) 97.8 F (36.6 C)  SpO2: 99% 98%     Intake/Output Summary (Last 24 hours) at 01/06/2019 0744 Last data filed at 01/06/2019 0400 Gross per 24 hour  Intake 595 ml  Output -  Net 595 ml    GENERAL:alert, no distress and comfortable HEART: stable lower extremity edema ABDOMEN:abdomen soft, distended Musculoskeletal:no cyanosis of digits and no clubbing  NEURO: alert & oriented x 3 with fluent  speech, no focal motor/sensory deficits   Labs:  Lab Results  Component Value Date   WBC 19.8 (H) 01/05/2019   HGB 10.6 (L) 01/05/2019   HCT 32.2 (L) 01/05/2019   MCV 89.2 01/05/2019   PLT 242 01/05/2019   NEUTROABS 6.6 12/31/2018    Lab Results  Component Value Date   NA 135 01/05/2019   K 4.5 01/05/2019   CL 105 01/05/2019   CO2 23 01/05/2019    Studies:  No results found.

## 2019-01-06 NOTE — NC FL2 (Signed)
[place] LEVEL OF CARE SCREENING TOOL     IDENTIFICATION  Patient Name: Haley Palmer Birthdate: 01-13-1946 Sex: female Admission Date (Current Location): 12/27/2018  [facility] and [place] Number:  Herbalist and Address:  [facility],  [place], [place]      Provider Number: [id number]  Attending Physician Name and Address:  Georgette Shell, MD  Relative Name and Phone Number:       Current Level of Care: Hospital Recommended Level of Care: [place] Prior Approval Number:    Date Approved/Denied:   PASRR Number:   [id number]  Discharge Plan: SNF    Current Diagnoses: Patient Active Problem List   Diagnosis Date Noted  . Malignant neoplasm of ovary ([place])   . Generalized abdominal mass   . AKI (acute kidney injury) ([place])   . Acute DVT (deep venous thrombosis) ([place]) 12/27/2018    Orientation RESPIRATION BLADDER Height & Weight     Self, Time, Situation, Place  Normal Continent Weight: 175 lb (79.4 kg) Height:  5\' 7"  (170.2 cm)  BEHAVIORAL SYMPTOMS/MOOD NEUROLOGICAL BOWEL NUTRITION STATUS      Continent Diet(regular )  AMBULATORY STATUS COMMUNICATION OF NEEDS Skin   Limited Assist Verbally Other (Comment)(Non pressure wount on buttocks)                       Personal Care Assistance Level of Assistance  Bathing, Dressing, Feeding Bathing Assistance: Limited assistance Feeding assistance: Independent Dressing Assistance: Limited assistance     Functional Limitations Info             SPECIAL CARE FACTORS FREQUENCY  PT (By licensed PT), OT (By licensed OT)     PT Frequency: 5 OT Frequency: 5            Contractures      Additional Factors Info  Code Status, Allergies Code Status Info: Full  Allergies Info: PENICILLINS           Current Medications (01/06/2019):  This is the current hospital active medication list Current Facility-Administered Medications   Medication Dose Route Frequency Provider Last Rate Last Dose  . 0.9 %  sodium chloride infusion   Intravenous PRN Georgette Shell, MD   Stopped at 01/04/19 2039  . acetaminophen (TYLENOL) tablet 650 mg  650 mg Oral Q6H PRN Patrecia Pour, MD       Or  . acetaminophen (TYLENOL) suppository 650 mg  650 mg Rectal Q6H PRN Patrecia Pour, MD      . bisacodyl (DULCOLAX) EC tablet 10 mg  10 mg Oral Daily Georgette Shell, MD   10 mg at 01/06/19 1020  . bisacodyl (DULCOLAX) suppository 10 mg  10 mg Rectal Once Georgette Shell, MD      . bismuth subsalicylate (PEPTO BISMOL) 262 MG/15ML suspension 30 mL  30 mL Oral Q4H PRN Patrecia Pour, MD      . enoxaparin (LOVENOX) injection 80 mg  1 mg/kg Subcutaneous Q12H Eudelia Bunch, RPH   80 mg at 01/06/19 1020  . hydrALAZINE (APRESOLINE) injection 10 mg  10 mg Intravenous Q6H PRN Vertis Kelch, NP   10 mg at 12/27/18 2007  . ondansetron (ZOFRAN) injection 4 mg  4 mg Intravenous Once Patrecia Pour, MD      . ondansetron [facility]) tablet 4 mg  4 mg Oral Q6H PRN Patrecia Pour, MD  Or  . ondansetron (ZOFRAN) injection 4 mg  4 mg Intravenous Q6H PRN Patrecia Pour, MD   4 mg at 12/28/18 0316  . pneumococcal 23 valent vaccine (PNU-IMMUNE) injection 0.5 mL  0.5 mL Intramuscular Tomorrow-1000 Patrecia Pour, MD         Discharge Medications: Please see discharge summary for a list of discharge medications.  Relevant Imaging Results:  Relevant Lab Results:   Additional Information [id number]  Weston Anna, LCSW

## 2019-01-06 NOTE — Progress Notes (Signed)
Patient selects [facility] SNF for placement- CSW has contacted facility to determine if they are able to accept patient today.   Will follow up  Haley Palmer, [place]  [phone]

## 2019-01-07 MED ORDER — PNEUMOCOCCAL VAC POLYVALENT 25 MCG/0.5ML IJ INJ
0.5000 mL | INJECTION | INTRAMUSCULAR | Status: AC
  Administered 2019-01-07: 0.5 mL via INTRAMUSCULAR
  Filled 2019-01-07 (×2): qty 0.5

## 2019-01-07 NOTE — Care Management Important Message (Signed)
Important Message  Patient Details  Name: Haley Palmer MRN: [id number] Date of Birth: 09/13/1946   Medicare Important Message Given:  Yes    Kerin Salen 01/07/2019, 10:30 AMImportant Message  Patient Details  Name: Haley Palmer MRN: [id number] Date of Birth: 1946/12/16   Medicare Important Message Given:  Yes    Kerin Salen 01/07/2019, 10:30 AM

## 2019-01-07 NOTE — Progress Notes (Signed)
LCSW and RNCM met with patient at bedside and explained dc and that she was currently incurring a bill.   LCSW and RNCM discussed SNF with patient and answered patient questions.   Patient stated that she had a son in [place] that she did not want to involve because he recently got ma new job and got married. LCSW encouraged patient to get assistance from family or friends that can help her make decisions and sort out her finances.   Patient agreeable to placement today. Facility awaiting communication from their finance office before they can accept patient.   LCSW notified by RN that patient had contacted her daughter in law and would like for LCSW to update her.   LCSW will continue to follow for dc needs.   Haley Palmer [place]

## 2019-01-08 ENCOUNTER — Inpatient Hospital Stay (HOSPITAL_COMMUNITY): Payer: Medicare Other

## 2019-01-08 MED ORDER — LIP MEDEX EX OINT: TOPICAL_OINTMENT | CUTANEOUS | 0 refills | Status: DC | PRN

## 2019-01-08 MED ORDER — LIP MEDEX EX OINT
TOPICAL_OINTMENT | CUTANEOUS | Status: DC | PRN
  Filled 2019-01-08: qty 7

## 2019-01-08 MED ORDER — LIDOCAINE HCL 1 % IJ SOLN
INTRAMUSCULAR | Status: AC
  Filled 2019-01-08: qty 10

## 2019-01-08 NOTE — Procedures (Signed)
PROCEDURE SUMMARY:  Successful image-guided paracentesis from the left lower abdomen.  Yielded 3.3 liters of golden fluid.  No immediate complications.  EBL: zero Patient tolerated well.   Specimen was not sent for labs.  Please see imaging section of Epic for full dictation.  Joaquim Nam PA-C 01/08/2019 12:30 PM

## 2019-01-08 NOTE — Progress Notes (Signed)
Pt discharged to [facility] via [facility] in stable condition. Report called to Verdie Drown, RN at facility. No immediate questions or concerns at this time.

## 2019-01-08 NOTE — Progress Notes (Signed)
Physical Therapy Treatment Patient Details Name: Haley Palmer MRN: [id number] DOB: [birth year] Today's Date: 01/08/2019    History of Present Illness 73 year old female was admitted for abdominal and LLE swelling and pain.  Found to have LLE DVT and ovarian mass. s/p IVC filter 1/2, on lovenox for DVT.     PT Comments    Pt declines OOB stating she is too fatigued from sitting up earlier; agreeable to bed level exercises. Strongly encouraged pt to continue exercises on her own as well as to amb with nursing staff; she needs to [facility] much more than she currently is, will continue to follow in acute setting; continue to recommend SNF as pt is a fall risk and is unable to safely return home at her current status unless she has incr caregiver support   Follow Up Recommendations  SNF     Equipment Recommendations  None recommended by PT(defer to SNF)    Recommendations for Other Services       Precautions / Restrictions Precautions Precautions: Fall Precaution Comments: distended abdomen Restrictions Weight Bearing Restrictions: No    Mobility  Bed Mobility               General bed mobility comments: pt declines OOB, states she sat up to pay bills and wants to take a nap  Transfers                    Ambulation/Gait                 Stairs             Wheelchair Mobility    Modified Rankin (Stroke Patients Only)       Balance                                            Cognition Arousal/Alertness: Awake/alert Behavior During Therapy: WFL for tasks assessed/performed Overall Cognitive Status: Within Functional Limits for tasks assessed                                        Exercises General Exercises - Lower Extremity Ankle Circles/Pumps: AROM;Both;15 reps Quad Sets: AROM;Both;10 reps;Supine Gluteal Sets: AROM;Both;10 reps;Supine Heel Slides: AROM;AAROM;Both;10 reps;Limitations Heel Slides  Limitations: self assisted using gait belt loop for LLE; AROM on R Straight Leg Raises: AAROM;Left;5 reps;Supine    General Comments        Pertinent Vitals/Pain Pain Assessment: Faces Faces Pain Scale: Hurts little more Pain Location: abdomen and LLE with movement Pain Descriptors / Indicators: Discomfort Pain Intervention(s): Monitored during session    Home Living                      Prior Function            PT Goals (current goals can now be found in the care plan section) Acute Rehab PT Goals Patient Stated Goal: return to independence; decreased pain/swelling PT Goal Formulation: With patient Time For Goal Achievement: 01/12/19 Potential to Achieve Goals: Fair Progress towards PT goals: Progressing toward goals(slowly)    Frequency    Min 2X/week      PT Plan Current plan remains appropriate    Co-evaluation  AM-PAC PT "6 Clicks" Mobility   Outcome Measure  Help needed turning from your back to your side while in a flat bed without using bedrails?: A Little Help needed moving from lying on your back to sitting on the side of a flat bed without using bedrails?: A Little Help needed moving to and from a bed to a chair (including a wheelchair)?: A Little Help needed standing up from a chair using your arms (e.g., wheelchair or bedside chair)?: A Little Help needed to walk in hospital room?: A Little Help needed climbing 3-5 steps with a railing? : Total 6 Click Score: 16    End of Session   Activity Tolerance: Patient limited by fatigue;Other (comment)(self limiting) Patient left: in bed;with call bell/phone within reach;with nursing/sitter in room   PT Visit Diagnosis: Muscle weakness (generalized) (M62.81);Difficulty in walking, not elsewhere classified (R26.2) Pain - Right/Left: Left Pain - part of body: Leg     Time: 1048-1100 PT Time Calculation (min) (ACUTE ONLY): 12 min  Charges:  $Therapeutic Exercise: 8-22  mins                     Kenyon Ana, PT  Pager: [phone] Acute Rehab Dept [facility]): [phone]   01/08/2019    [facility] 01/08/2019, 11:05 AM

## 2019-01-08 NOTE — Progress Notes (Signed)
Pt admitting to [facility]- report [phone] [facility] transportation arranged.  Discussed disposition at length with pt and her daughter in law this morning. Pt agreeable to being billed for medication and therapy as she does not have Medicare part B. Reports in the past she was told she qualified for "10% disability" and she is going to contact social security to see if she still qualifies. Daughter in law also going to contact Medicare to see if pt can obtain part B Medicare so that in future this would not become issue.  Facility aware of pt's need for transportation to chemotherapy appointments at [facility].  Sharren Bridge, MSW, LCSW Clinical Social Work 01/08/2019 [phone]

## 2019-01-11 ENCOUNTER — Encounter: Payer: Self-pay | Admitting: Adult Health

## 2019-01-11 ENCOUNTER — Non-Acute Institutional Stay (SKILLED_NURSING_FACILITY): Payer: Self-pay | Admitting: Adult Health

## 2019-01-11 DIAGNOSIS — I82412 Acute embolism and thrombosis of left femoral vein: Secondary | ICD-10-CM

## 2019-01-11 DIAGNOSIS — D638 Anemia in other chronic diseases classified elsewhere: Secondary | ICD-10-CM

## 2019-01-11 DIAGNOSIS — C569 Malignant neoplasm of unspecified ovary: Secondary | ICD-10-CM

## 2019-01-11 DIAGNOSIS — N179 Acute kidney failure, unspecified: Secondary | ICD-10-CM

## 2019-01-11 NOTE — Progress Notes (Signed)
Location:  [place] Room Number: 213-A Place of Service:  SNF (31) Provider:  Durenda Age, NP  Patient Care Team: Patient, No Pcp Per as PCP - General (General Practice)  Extended Emergency Contact Information Primary Emergency Contact: Lancon,THOMAS Address: [place],  [place] Home Phone: [phone] Relation: None  Code Status:  Full Code  Goals of care: Advanced Directive information Advanced Directives 01/01/2019  Does Patient Have a Medical Advance Directive? No  Would patient like information on creating a medical advance directive? No - Patient declined    Chief Complaint  Patient presents with  . Acute Visit    Hospital followup, status post admission at [facility] 12/27/18-01/08/2019 for DVT, AKI, and malignant neoplasm of ovary    HPI:  Pt is a 73 y.o. female seen today for hospital followup.  She was admitted to [facility] 01/08/2019 for short-term rehabilitation following an admission at [facility] 12/27/18-01/08/2019 for DVT, AKI, and malignant neoplasm of the ovary. She had been having unintentional weight gain, enlarging abdomen, dyspnea on exertion for several months and developed new left leg swelling and pain over 2 days.  She was found to have ovarian mass with associated ascites and acute left leg DVT.  IVC filter placed by interventional radiology on 12/31/2018.  She was placed on Lovenox for DVT treatment with no stop date at this time and will follow-up with Dr. Alvy Bimler, hematology.  She is S/P paracentesis on 12/30 and removed 5 L and another 9.3 L on 12/31/2018.  Ovarian mass is thought to be most likely advanced ovarian cancer.  She started chemotherapy on 01/04/2019 and her next cycle will be in 3 weeks.  She will follow-up with GYN, Dr. Harrington Challenger, to discuss about surgery.    Past Medical History:  Diagnosis Date  . AKI (acute kidney injury) ([place]) 12/2018  . DVT (deep venous thrombosis) ([place])  12/2018  . Ovarian cancer [facility])    Past Surgical History:  Procedure Laterality Date  . CESAREAN SECTION    . Cyst removal on Left Wrist Left   . IR IVC FILTER PLMT / S&I /IMG GUID/MOD SED  12/31/2018  . Surgery on Right Wrist Right 2006  . TONSILLECTOMY      Allergies  Allergen Reactions  . Penicillins Rash    DID THE REACTION INVOLVE: Swelling of the face/tongue/throat, SOB, or low BP? No Sudden or severe rash/hives, skin peeling, or the inside of the mouth or nose? No Did it require medical treatment? No When did it last happen?childhood allergy If all above answers are "NO", may proceed with cephalosporin use.     Outpatient Encounter Medications as of 01/11/2019  Medication Sig  . bismuth subsalicylate (PEPTO BISMOL) 262 MG/15ML suspension Take 30 mLs by mouth every 4 (four) hours as needed for indigestion.  . enoxaparin (LOVENOX) 80 MG/0.8ML injection Inject 0.8 mLs (80 mg total) into the skin every 12 (twelve) hours.  . lip balm (CARMEX) ointment Apply topically as needed for lip care.   No facility-administered encounter medications on file as of 01/11/2019.     Review of Systems  GENERAL: No change in appetite, no fatigue, no weight changes, no fever, chills or weakness MOUTH and THROAT: Denies oral discomfort, gingival pain or bleeding RESPIRATORY: no cough, SOB, DOE, wheezing, hemoptysis CARDIAC: No chest pain, or palpitations, +edema GI: No abdominal pain, diarrhea, constipation, heart burn, nausea or vomiting GU: Denies dysuria, frequency, hematuria,  incontinence, or discharge PSYCHIATRIC: Denies feelings of depression or anxiety. No report of hallucinations, insomnia, paranoia, or agitation    Immunization History  Administered Date(s) Administered  . Pneumococcal Polysaccharide-23 01/07/2019   Pertinent  Health Maintenance Due  Topic Date Due  . MAMMOGRAM  07/21/1995  . COLONOSCOPY  07/21/1995  . DEXA SCAN  07/20/2010  . INFLUENZA VACCINE   07/30/2018  . PNA vac Low Risk Adult (2 of 2 - PCV13) 01/08/2020      Vitals:   01/11/19 1244  BP: 123/69  Pulse: 90  Resp: 20  Temp: 98.9 F (37.2 C)  TempSrc: Oral  SpO2: 98%  Weight: 175 lb (79.4 kg)  Height: 5\' 7"  (1.702 m)   Body mass index is 27.41 kg/m.  Physical Exam  GENERAL APPEARANCE: Well nourished. In no acute distress. Normal body habitus SKIN:  Skin is warm and dry. MOUTH and THROAT: Lips are without lesions. Oral mucosa is moist and without lesions. Tongue is normal in shape, size, and color and without lesions RESPIRATORY: Breathing is even & unlabored, BS CTAB CARDIAC: RRR, no murmur,no extra heart sounds, LLE 2-3+ edema GI: Abdomen soft, normal BS, no masses, no tenderness EXTREMITIES:  Able to move X 4 extremities NEUROLOGICAL: There is no tremor. Speech is clear.  Alert and oriented X 3. PSYCHIATRIC: Affect and behavior are appropriate   Labs reviewed: Recent Labs    01/03/19 0607 01/04/19 0524 01/05/19 0337  NA 137 136 135  K 4.1 4.8 4.5  CL 106 105 105  CO2 21* 22 23  GLUCOSE 112* 154* 168*  BUN 27* 26* 28*  CREATININE 1.03* 1.13* 1.02*  CALCIUM 8.2* 8.6* 8.1*   Recent Labs    12/27/18 1108 12/31/18 0541  AST 32 35  ALT 21 26  ALKPHOS 84 56  BILITOT 0.9 0.6  PROT 9.2* 5.0*  ALBUMIN 3.6 2.2*   Recent Labs    12/27/18 1108  12/31/18 0541  01/03/19 0607 01/04/19 0524 01/05/19 0337  WBC 13.0*   < > 9.2   < > 8.5 9.9 19.8*  NEUTROABS 11.2*  --  6.6  --   --   --   --   HGB 14.2   < > 10.4*   < > 10.3* 11.3* 10.6*  HCT 44.6   < > 32.5*   < > 32.8* 35.0* 32.2*  MCV 91.4   < > 91.3   < > 90.4 90.9 89.2  PLT 172   < > 238   < > 172 195 242   < > = values in this interval not displayed.   Lab Results  Component Value Date   TSH 1.162 12/28/2018    Significant Diagnostic Results in last 30 days:  Ct Abdomen Pelvis Wo Contrast  Result Date: 12/27/2018 CLINICAL DATA:  73 year old female with history of abdominal distension.  Evaluate for potential mass. EXAM: CT CHEST, ABDOMEN AND PELVIS WITHOUT CONTRAST TECHNIQUE: Multidetector CT imaging of the chest, abdomen and pelvis was performed following the standard protocol without IV contrast. COMPARISON:  None. FINDINGS: CT CHEST FINDINGS Cardiovascular: Heart size is normal. There is no significant pericardial fluid, thickening or pericardial calcification. Atherosclerotic calcifications in the left anterior descending coronary artery. Mediastinum/Nodes: No pathologically enlarged mediastinal or hilar lymph nodes. Moderate-sized hiatal hernia. No axillary lymphadenopathy. Lungs/Pleura: Mild diffuse bronchial wall thickening with mild centrilobular and paraseptal emphysema. Innumerable tiny 1-3 mm pulmonary nodules scattered throughout the periphery of the lungs bilaterally, nonspecific. No larger more suspicious appearing pulmonary  nodules or masses are noted. No acute consolidative airspace disease. No pleural effusions. Areas of linear scarring noted in the right middle lobe, inferior segment of the lingula and anterior aspect of the left lower lobe. Musculoskeletal: There are no aggressive appearing lytic or blastic lesions noted in the visualized portions of the skeleton. CT ABDOMEN PELVIS FINDINGS Hepatobiliary: No definite cystic or solid hepatic lesions are confidently identified on today's noncontrast CT examination. Gallbladder is not confidently identified may be surgically absent, or may be obscured by adjacent ascites. Pancreas: No definite pancreatic mass noted on today's noncontrast CT examination. No definite surrounding peripancreatic inflammatory changes. Spleen: Unremarkable. Adrenals/Urinary Tract: Unenhanced appearance of the kidneys and bilateral adrenal glands is normal. No hydroureteronephrosis. Urinary bladder is poorly demonstrated, but unremarkable in appearance. Stomach/Bowel: Normal appearance of the stomach. No pathologic dilatation of small bowel or colon.  Appendix is not confidently identified. Vascular/Lymphatic: Aortic atherosclerosis. No lymphadenopathy identified in the abdomen or pelvis on today's noncontrast CT examination. Reproductive: Unenhanced appearance of the uterus is unremarkable. In the central abdomen and pelvis there is a very large centrally low-attenuation rim enhancing mass (axial image 85 of series 2 and sagittal image 92 of series 7) measuring 28.7 x 25.6 x 25.6 cm. This appears potentially related to the right adnexal region, although the origin of this lesion is uncertain. Left ovary is not confidently identified. Other: Large volume of ascites.  No pneumoperitoneum. Musculoskeletal: There are no aggressive appearing lytic or blastic lesions noted in the visualized portions of the skeleton. IMPRESSION: 1. 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 This is associated with a very large volume of (presumably malignant) ascites. 2. Multiple tiny 1-3 mm pulmonary nodules scattered throughout the lungs bilaterally. These are nonspecific, but favored to represent benign areas of mucoid impaction within terminal bronchioles. However, close attention on follow-up studies is recommended to ensure the stability or resolution of this finding, as the possibility of metastatic disease is not excluded. 3. Aortic atherosclerosis, in addition to left anterior descending coronary artery disease. Assessment for potential risk factor modification, dietary therapy or pharmacologic therapy may be warranted, if clinically indicated. Electronically Signed   By: Vinnie Langton M.D.   On: 12/27/2018 13:41   Ct Chest Wo Contrast  Result Date: 12/27/2018 CLINICAL DATA:  73 year old female with history of abdominal distension. Evaluate for potential mass. EXAM: CT CHEST, ABDOMEN AND PELVIS WITHOUT CONTRAST TECHNIQUE: Multidetector CT imaging of the chest, abdomen  and pelvis was performed following the standard protocol without IV contrast. COMPARISON:  None. FINDINGS: CT CHEST FINDINGS Cardiovascular: Heart size is normal. There is no significant pericardial fluid, thickening or pericardial calcification. Atherosclerotic calcifications in the left anterior descending coronary artery. Mediastinum/Nodes: No pathologically enlarged mediastinal or hilar lymph nodes. Moderate-sized hiatal hernia. No axillary lymphadenopathy. Lungs/Pleura: Mild diffuse bronchial wall thickening with mild centrilobular and paraseptal emphysema. Innumerable tiny 1-3 mm pulmonary nodules scattered throughout the periphery of the lungs bilaterally, nonspecific. No larger more suspicious appearing pulmonary nodules or masses are noted. No acute consolidative airspace disease. No pleural effusions. Areas of linear scarring noted in the right middle lobe, inferior segment of the lingula and anterior aspect of the left lower lobe. Musculoskeletal: There are no aggressive appearing lytic or blastic lesions noted in the visualized portions of the skeleton. CT ABDOMEN PELVIS FINDINGS Hepatobiliary: No definite cystic or solid hepatic lesions are confidently identified on today's noncontrast  CT examination. Gallbladder is not confidently identified may be surgically absent, or may be obscured by adjacent ascites. Pancreas: No definite pancreatic mass noted on today's noncontrast CT examination. No definite surrounding peripancreatic inflammatory changes. Spleen: Unremarkable. Adrenals/Urinary Tract: Unenhanced appearance of the kidneys and bilateral adrenal glands is normal. No hydroureteronephrosis. Urinary bladder is poorly demonstrated, but unremarkable in appearance. Stomach/Bowel: Normal appearance of the stomach. No pathologic dilatation of small bowel or colon. Appendix is not confidently identified. Vascular/Lymphatic: Aortic atherosclerosis. No lymphadenopathy identified in the abdomen or pelvis on  today's noncontrast CT examination. Reproductive: Unenhanced appearance of the uterus is unremarkable. In the central abdomen and pelvis there is a very large centrally low-attenuation rim enhancing mass (axial image 85 of series 2 and sagittal image 92 of series 7) measuring 28.7 x 25.6 x 25.6 cm. This appears potentially related to the right adnexal region, although the origin of this lesion is uncertain. Left ovary is not confidently identified. Other: Large volume of ascites.  No pneumoperitoneum. Musculoskeletal: There are no aggressive appearing lytic or blastic lesions noted in the visualized portions of the skeleton. IMPRESSION: 1. 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 This is associated with a very large volume of (presumably malignant) ascites. 2. Multiple tiny 1-3 mm pulmonary nodules scattered throughout the lungs bilaterally. These are nonspecific, but favored to represent benign areas of mucoid impaction within terminal bronchioles. However, close attention on follow-up studies is recommended to ensure the stability or resolution of this finding, as the possibility of metastatic disease is not excluded. 3. Aortic atherosclerosis, in addition to left anterior descending coronary artery disease. Assessment for potential risk factor modification, dietary therapy or pharmacologic therapy may be warranted, if clinically indicated. Electronically Signed   By: Vinnie Langton M.D.   On: 12/27/2018 13:41   Ir Ivc Filter Plmt / S&i /img Guid/mod Sed  Result Date: 12/31/2018 INDICATION: 73 year old with a large central abdominal and pelvic mass. EXAM: IVC FILTER PLACEMENT; IVC VENOGRAM; ULTRASOUND FOR VASCULAR ACCESS Physician: Stephan Minister. Anselm Pancoast, MD MEDICATIONS: None. ANESTHESIA/SEDATION: Fentanyl 100 mcg IV; Versed 2.0 mg IV Moderate Sedation Time:  26 minutes minutes The patient was continuously monitored  during the procedure by the interventional radiology nurse under my direct supervision. CONTRAST:  Carbon dioxide FLUOROSCOPY TIME:  Fluoroscopy Time: 4 minutes 48 seconds (437 mGy). COMPLICATIONS: None immediate. PROCEDURE: The procedure was explained to the patient. The risks and benefits of the procedure were discussed and the patient's questions were addressed. Informed consent was obtained from the patient. Ultrasound demonstrated a patent right internal jugular vein. Ultrasound images were obtained for documentation. The right side of the neck was prepped and draped in a sterile fashion. Maximal barrier sterile technique was utilized including caps, mask, sterile gowns, sterile gloves, sterile drape, hand hygiene and skin antiseptic. The skin was anesthetized with 1% lidocaine. A 21 gauge needle was directed into the vein with ultrasound guidance and a micropuncture dilator set was placed. A wire was advanced into the IVC. The filter sheath was advanced over the wire into the IVC. An IVC venogram was performed with carbon dioxide due to renal insufficiency. Fluoroscopic images were obtained for documentation. Renal veins were not confidently identified with carbon dioxide. Therefore, 5 French catheter and Bentson wire were used to cannulate the bilateral renal veins. A Bard Denali filter was deployed below the lowest renal vein. Left renal vein was again cannulated  with a catheter and wire to ensure placement below the renal vein. Final spot radiograph images were obtained. Vascular sheath was removed with manual compression. Bandage placed over the puncture site. FINDINGS: IVC is small but patent. Bilateral renal veins were identified by cannulating the veins with a catheter and wire. The filter was deployed below the lowest renal vein. IMPRESSION: Successful placement of a retrievable IVC filter. PLAN: This IVC filter is potentially retrievable. The patient will be assessed for filter retrieval by  Interventional Radiology in approximately 8-12 weeks. Further recommendations regarding filter retrieval, continued surveillance or declaration of device permanence, will be made at that time. Electronically Signed   By: Markus Daft M.D.   On: 12/31/2018 13:28   US Paracentesis  Result Date: 01/08/2019 INDICATION: Patient with history of advanced ovarian cancer with malignant ascites. Request for therapeutic paracentesis today. EXAM: ULTRASOUND GUIDED THERAPEUTIC PARACENTESIS MEDICATIONS: 10 mL 1% lidocaine COMPLICATIONS: None immediate. PROCEDURE: Informed written consent was obtained from the patient after a discussion of the risks, benefits and alternatives to treatment. A timeout was performed prior to the initiation of the procedure. Initial ultrasound scanning demonstrates a moderate amount of ascites within the left lower abdominal quadrant. The left lower abdomen was prepped and draped in the usual sterile fashion. 1% lidocaine was used for local anesthesia. Following this, a 19 gauge, 7-cm, Yueh catheter was introduced. An ultrasound image was saved for documentation purposes. The paracentesis was performed. The catheter was removed and a dressing was applied. The patient tolerated the procedure well without immediate post procedural complication. FINDINGS: A total of approximately 3.3 L of hazy yellow fluid was removed. IMPRESSION: Successful ultrasound-guided paracentesis yielding 3.3 liters of peritoneal fluid. Read by Cyndee Brightly Electronically Signed   By: Jerilynn Mages.  Shick M.D.   On: 01/08/2019 14:31   US Paracentesis  Result Date: 01/01/2019 INDICATION: Patient with history of metastatic adenocarcinoma, abdominopelvic mass, recurrent malignant ascites. Request made for therapeutic paracentesis. EXAM: ULTRASOUND GUIDED THERAPEUTIC PARACENTESIS MEDICATIONS: None COMPLICATIONS: None immediate. PROCEDURE: Informed written consent was obtained from the patient after a discussion of the risks,  benefits and alternatives to treatment. A timeout was performed prior to the initiation of the procedure. Initial ultrasound scanning demonstrates a large amount of ascites within the left lower abdominal quadrant. The left lower abdomen was prepped and draped in the usual sterile fashion. 1% lidocaine was used for local anesthesia. Following this, a 19 gauge, 10-cm, Yueh catheter was introduced. An ultrasound image was saved for documentation purposes. The paracentesis was performed. The catheter was removed and a dressing was applied. The patient tolerated the procedure well without immediate post procedural complication. FINDINGS: A total of approximately 9.3 liters of slightly hazy, yellow fluid was removed. IMPRESSION: Successful ultrasound-guided therapeutic paracentesis yielding 9.3 liters of peritoneal fluid. Read by: Rowe Robert, PA-C Electronically Signed   By: Markus Daft M.D.   On: 12/31/2018 17:38   US Paracentesis  Result Date: 12/28/2018 INDICATION: Patient with history of lower extremity DVT, abdomino-pelvic mass, renal insufficiency, ascites ; request made for diagnostic and therapeutic paracentesis up to 5 liters. EXAM: ULTRASOUND GUIDED DIAGNOSTIC AND THERAPEUTIC PARACENTESIS MEDICATIONS: None COMPLICATIONS: None immediate. PROCEDURE: Informed written consent was obtained from the patient after a discussion of the risks, benefits and alternatives to treatment. A timeout was performed prior to the initiation of the procedure. Initial ultrasound scanning demonstrates a large amount of ascites within the left mid to lower abdominal quadrant. The left mid to lower abdomen was prepped and draped  in the usual sterile fashion. 1% lidocaine was used for local anesthesia. Following this, a 19 gauge, 7-cm, Yueh catheter was introduced. An ultrasound image was saved for documentation purposes. The paracentesis was performed. The catheter was removed and a dressing was applied. The patient tolerated the  procedure well without immediate post procedural complication. FINDINGS: A total of approximately 5 liters of slightly hazy, yellow fluid was removed. Samples were sent to the laboratory as requested by the clinical team. IMPRESSION: Successful ultrasound-guided diagnostic and therapeutic paracentesis yielding 5 liters of peritoneal fluid. Read by: Rowe Robert, PA-C Electronically Signed   By: Sandi Mariscal M.D.   On: 12/28/2018 15:45   Dg Chest Portable 1 View  Result Date: 12/27/2018 CLINICAL DATA:  Abdominal distension and leg swelling EXAM: PORTABLE CHEST 1 VIEW COMPARISON:  None. FINDINGS: Monitoring leads overlie the patient. Normal cardiac and mediastinal contours. No consolidative pulmonary opacities. No pleural effusion or pneumothorax. Thoracic spine degenerative changes. IMPRESSION: No active disease. Electronically Signed   By: Lovey Newcomer M.D.   On: 12/27/2018 11:48   Vas [place] Lower Extremity Venous (dvt) (only [facility])  Result Date: 12/29/2018  Lower Venous Study Indications: Pain, Swelling, and Edema.  Limitations: Body habitus and tightenedness due to edema. Comparison Study: No prior study available. Performing Technologist: Lorina Rabon  Examination Guidelines: A complete evaluation includes B-mode imaging, spectral Doppler, color Doppler, and power Doppler as needed of all accessible portions of each vessel. Bilateral testing is considered an integral part of a complete examination. Limited examinations for reoccurring indications may be performed as noted.  Right Venous Findings: +---+---------------+---------+-----------+----------+-------+    CompressibilityPhasicitySpontaneityPropertiesSummary +---+---------------+---------+-----------+----------+-------+ CFVFull           Yes      Yes                          +---+---------------+---------+-----------+----------+-------+  Left Venous Findings:  +---------+---------------+---------+-----------+----------+-----------------+          CompressibilityPhasicitySpontaneityPropertiesSummary           +---------+---------------+---------+-----------+----------+-----------------+ CFV      None           No       No                   Acute             +---------+---------------+---------+-----------+----------+-----------------+ SFJ      None                                         Acute             +---------+---------------+---------+-----------+----------+-----------------+ FV Prox  None                                         Acute             +---------+---------------+---------+-----------+----------+-----------------+ FV Mid   None                                         Acute             +---------+---------------+---------+-----------+----------+-----------------+ FV DistalNone  Acute             +---------+---------------+---------+-----------+----------+-----------------+ PFV      None                                         Age Indeterminate +---------+---------------+---------+-----------+----------+-----------------+ POP      None                                         Age Indeterminate +---------+---------------+---------+-----------+----------+-----------------+ PTV      None                                         Age Indeterminate +---------+---------------+---------+-----------+----------+-----------------+ PERO     None                                         Age Indeterminate +---------+---------------+---------+-----------+----------+-----------------+ GSV      None                                         Acute             +---------+---------------+---------+-----------+----------+-----------------+ EIV      None           No                            Acute              +---------+---------------+---------+-----------+----------+-----------------+  Summary: Right: No evidence of common femoral vein obstruction. Left: Findings consistent with acute deep vein thrombosis involving the left common femoral vein, and left femoral vein. Findings consistent with acute superficial vein thrombosis involving the left great saphenous vein. Findings consistent with age indeterminate deep vein thrombosis involving the left proximal profunda vein, left popliteal vein, left posterior tibial vein, and left peroneal vein. Left external iliac vein appears thrombosied. IVC was not viualized. No cystic structure found in the popliteal fossa.  *See table(s) above for measurements and observations. Electronically signed by Deitra Mayo MD on 12/29/2018 at 19:38:59 AM.    Final     Assessment/Plan  1. Acute deep vein thrombosis (DVT) of femoral vein of left lower extremity (HCC) -  IVC filter placed by interventional radiology on 12/31/2018.  She was placed on Lovenox for DVT treatment with no stop date at this time and will follow-up with Dr. Alvy Bimler, hematology.   2. 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  Ovarian mass is thought to be most likely advanced ovarian cancer.  She started chemotherapy on 01/04/2019 and her next cycle will be in 3 weeks.  She will follow-up with GYN, Dr. Harrington Challenger, to discuss about surgery.   3. AKI (acute kidney injury) ([place]) -   thought to be pre-renal, will re-check BMP Lab Results  Component Value Date   CREATININE 1.02 (H) 01/05/2019     4. Anemia of chronic disease -  will re-check CBC Lab Results  Component Value Date   HGB 10.6 (L) 01/05/2019      Family/ staff Communication: Discussed plan of care with patient.  Labs/tests ordered:  CBC and BMP in 1 week  Goals of care:   Short-term rehabilitation.   Durenda Age, NP [facility] [phone] (Monday-Friday 8:00 a.m. - 5:00 p.m.) [phone] (after hours)

## 2019-01-12 ENCOUNTER — Encounter: Payer: Self-pay | Admitting: Internal Medicine

## 2019-01-12 ENCOUNTER — Non-Acute Institutional Stay (SKILLED_NURSING_FACILITY): Payer: Self-pay | Admitting: Internal Medicine

## 2019-01-12 DIAGNOSIS — C569 Malignant neoplasm of unspecified ovary: Secondary | ICD-10-CM

## 2019-01-12 DIAGNOSIS — I82412 Acute embolism and thrombosis of left femoral vein: Secondary | ICD-10-CM

## 2019-01-12 DIAGNOSIS — N179 Acute kidney failure, unspecified: Secondary | ICD-10-CM

## 2019-01-12 NOTE — Assessment & Plan Note (Signed)
OP F/U with Dr Alvy Bimler & Dr Denman George

## 2019-01-12 NOTE — Patient Instructions (Signed)
See assessment and plan under each diagnosis in the problem list and acutely for this visit 

## 2019-01-12 NOTE — Progress Notes (Signed)
NURSING HOME LOCATION:  [facility] ROOM NUMBER:  [id number]  CODE STATUS:  Full Code  PCP:  Haley Palmer, No Pcp Per   This is a comprehensive admission note to [facility] performed on this date less than 30 days from date of admission. Included are preadmission medical/surgical history; reconciled medication list; family history; social history and comprehensive review of systems.  Corrections and additions to the records were documented. Comprehensive physical exam was also performed. Additionally a clinical summary was entered for each active diagnosis pertinent to this admission in the Problem List to enhance continuity of care.  HPI: Haley Palmer was hospitalized 12/27/2018-01/06/2019, presenting from home with unintentional weight gain, enlarging abdomen, exertional dyspnea and new onset swelling of the left lower extremity with associated pain over 2 days.  An ovarian mass with associated ascites and acute left lower extremity DVT were documented. Paracentesis 12/28/2018 produced 5 L of fluids.  An additional 9.3 L was removed 12/31/2018.  Apparently 2 L was removed the day of discharge according to the Haley Palmer.  Frequent paracenteses were anticipated because of the recurrent ascites.  Her course was complicated by AKI felt to be prerenal due to diminished p.o. intake. This slowly improved with intravenous fluids.  CT did not reveal any obstructive lesion. IVC was placed by interventional radiology 12/31/2018.  Lovenox was to be continued without a stop date. Her Oncologist ,Dr Alvy Bimler will determine the duration of the Lovenox DVT treatment/prophylaxis. Dr. Alvy Bimler initiated chemotherapy with plan for 3 cycles.  Chemotherapy was to be repeated  3 weeks from 01/04/2019.  Debulking surgery in the near future by Gynecology was to be pursued after completing those 3 cycles of chemotherapy.  She states that she saw Dr. Denman George, Gynecologist while hospitalized. Incidentally noted pulmonary nodules were  present bilaterally.  Follow-up imaging recommended to assess possible metastatic disease.  Past medical and surgical history: All diagnoses relate to the current admission. Surgeries have included C-section.  Social history: Nondrinker.  Former 37-year smoker  Family history: Strong paternal family history of cancer.  Her younger sister lives in [place] but they are estranged "over politics" (!).  Her son lives in [place].  Another sister lives in [place] but is now spending the winter in [place] with her husband.   Review of systems: The Haley Palmer believes that the abdominal swelling began a year ago.  She did not seek help until she developed the swelling on the left leg.  She describes tightness in the abdomen which defines the need for paracentesis. She describes weakness and swelling particularly in the left lower extremity.  She is been using a strap provided by PT for mobilization of this limb.  She states she is " here to rebuild (my) health after chemotherapy".  She describes numbness in the left foot intermittently.  Constitutional: No fever Eyes: No redness, discharge, pain, vision change ENT/mouth: No nasal congestion, purulent discharge, earache, change in hearing, sore throat  Cardiovascular: No chest pain, palpitations, paroxysmal nocturnal dyspnea, claudication, edema  Respiratory: No cough, sputum production, hemoptysis, DOE, significant snoring, apnea Gastrointestinal: No heartburn, dysphagia, abdominal pain beyond the tightness, nausea /vomiting, rectal bleeding, melena, change in bowels Genitourinary: No dysuria, hematuria, pyuria, incontinence, nocturia Musculoskeletal: No joint stiffness, joint swelling, weakness, pain Dermatologic: No rash, pruritus, change in appearance of skin Neurologic: No dizziness, headache, syncope, seizures Psychiatric: Voices no significant anxiety, depression, insomnia, anorexia (requesting fiber) Endocrine: No change in hair/skin/nails, excessive  thirst, excessive hunger, excessive urination  Hematologic/lymphatic: No significant bruising, lymphadenopathy,  abnormal bleeding Allergy/immunology: No itchy/watery eyes, significant sneezing, urticaria, angioedema  Physical exam:  Pertinent or positive findings: Hair is slightly disheveled and bound up.  She wears very large thick lenses.  Lateral eyebrows are decreased.  There is suggestion of slight rhonchorous character to expirations at the bases but this is very subtle.  There is a slight increase in the first heart sound.  Abdomen is asymmetrically distended, more so on the right than the left.  Bowel sounds were decreased on my exam.  Pedal pulses are decreased.  She has tight edema especially of the left lower extremity with shiny character to the skin.  There is weakness to opposition in the left lower extremity compared to the other extremities.  She has a small area of ecchymosis at the base of the right neck which she states was the site of the IVC introduction.  General appearance: no acute distress, increased work of breathing is present.   Lymphatic: No lymphadenopathy about the head, neck, axilla. Eyes: No conjunctival inflammation or lid edema is present. There is no scleral icterus. Ears:  External ear exam shows no significant lesions or deformities.   Nose:  External nasal examination shows no deformity or inflammation. Nasal mucosa are pink and moist without lesions, exudates Oral exam: Lips and gums are healthy appearing.There is no oropharyngeal erythema or exudate. Neck:  No thyromegaly, masses, tenderness noted.    Heart:  Normal rate and regular rhythm. S2 normal without gallop, murmur, click, rub.  Lungs:  without wheezes, rales, rubs. Abdomen:  no organomegaly, hernias, palpable masses. GU: Deferred  Extremities:  No cyanosis, clubbing. Neurologic exam:  Balance, Rhomberg, finger to nose testing could not be completed due to clinical state Skin: Warm & dry w/o  tenting. No significant lesions or rash.  See clinical summary under each active problem in the Problem List with associated updated therapeutic plan

## 2019-01-14 NOTE — Assessment & Plan Note (Signed)
Monitor BMET. 

## 2019-01-18 ENCOUNTER — Encounter: Payer: Self-pay | Admitting: Oncology

## 2019-01-18 LAB — BASIC METABOLIC PANEL WITH GFR
BUN: 17 (ref 4–21)
Creatinine: 0.9 (ref 0.5–1.1)
Glucose: 110
Potassium: 4 (ref 3.4–5.3)
Sodium: 141 (ref 137–147)

## 2019-01-18 LAB — CBC AND DIFFERENTIAL
HCT: 29 — AB (ref 36–46)
Hemoglobin: 9.9 — AB (ref 12.0–16.0)
Neutrophils Absolute: 0
Platelets: 179 (ref 150–399)
WBC: 1.9

## 2019-01-19 ENCOUNTER — Other Ambulatory Visit: Payer: Self-pay | Admitting: *Deleted

## 2019-01-20 ENCOUNTER — Other Ambulatory Visit: Payer: Self-pay

## 2019-01-20 DIAGNOSIS — C569 Malignant neoplasm of unspecified ovary: Secondary | ICD-10-CM

## 2019-01-21 ENCOUNTER — Inpatient Hospital Stay: Payer: Medicare Other

## 2019-01-21 ENCOUNTER — Telehealth: Payer: Self-pay | Admitting: Oncology

## 2019-01-21 ENCOUNTER — Inpatient Hospital Stay: Payer: Medicare Other | Admitting: Hematology and Oncology

## 2019-01-21 NOTE — Telephone Encounter (Signed)
Called Debra with [facility] and arranged transportation for apts on Monday, 1/27 and 1/30.  Attempted to call [place] but there was no answer.  Also tried to contact her nurse but she was not available.

## 2019-01-22 ENCOUNTER — Telehealth: Payer: Self-pay | Admitting: Oncology

## 2019-01-22 NOTE — Telephone Encounter (Signed)
[facility] and talked with patient's nurse, Jocelyn.  She will notify Sindee of appointments for next week.

## 2019-01-25 ENCOUNTER — Telehealth: Payer: Self-pay | Admitting: Hematology and Oncology

## 2019-01-25 ENCOUNTER — Telehealth: Payer: Self-pay | Admitting: Oncology

## 2019-01-25 ENCOUNTER — Other Ambulatory Visit: Payer: Self-pay | Admitting: Hematology and Oncology

## 2019-01-25 ENCOUNTER — Encounter: Payer: Self-pay | Admitting: Oncology

## 2019-01-25 ENCOUNTER — Inpatient Hospital Stay (HOSPITAL_BASED_OUTPATIENT_CLINIC_OR_DEPARTMENT_OTHER): Payer: Self-pay | Admitting: Hematology and Oncology

## 2019-01-25 ENCOUNTER — Inpatient Hospital Stay: Payer: Self-pay | Attending: Hematology and Oncology

## 2019-01-25 ENCOUNTER — Encounter: Payer: Self-pay | Admitting: Hematology and Oncology

## 2019-01-25 ENCOUNTER — Inpatient Hospital Stay: Payer: Self-pay

## 2019-01-25 ENCOUNTER — Inpatient Hospital Stay: Payer: Medicare Other

## 2019-01-25 VITALS — BP 125/54 | HR 96 | Temp 97.7°F | Resp 18

## 2019-01-25 DIAGNOSIS — C569 Malignant neoplasm of unspecified ovary: Secondary | ICD-10-CM

## 2019-01-25 DIAGNOSIS — D630 Anemia in neoplastic disease: Secondary | ICD-10-CM

## 2019-01-25 DIAGNOSIS — R601 Generalized edema: Secondary | ICD-10-CM | POA: Insufficient documentation

## 2019-01-25 DIAGNOSIS — R180 Malignant ascites: Secondary | ICD-10-CM | POA: Insufficient documentation

## 2019-01-25 DIAGNOSIS — I82412 Acute embolism and thrombosis of left femoral vein: Secondary | ICD-10-CM

## 2019-01-25 DIAGNOSIS — Z5111 Encounter for antineoplastic chemotherapy: Secondary | ICD-10-CM | POA: Insufficient documentation

## 2019-01-25 LAB — CBC WITH DIFFERENTIAL/PLATELET
Abs Immature Granulocytes: 0.14 10*3/uL — ABNORMAL HIGH (ref 0.00–0.07)
BASOS PCT: 1 %
Basophils Absolute: 0 10*3/uL (ref 0.0–0.1)
Eosinophils Absolute: 0.2 10*3/uL (ref 0.0–0.5)
Eosinophils Relative: 2 %
HCT: 32.5 % — ABNORMAL LOW (ref 36.0–46.0)
Hemoglobin: 10.7 g/dL — ABNORMAL LOW (ref 12.0–15.0)
Immature Granulocytes: 2 %
LYMPHS ABS: 1 10*3/uL (ref 0.7–4.0)
Lymphocytes Relative: 13 %
MCH: 29.5 pg (ref 26.0–34.0)
MCHC: 32.9 g/dL (ref 30.0–36.0)
MCV: 89.5 fL (ref 80.0–100.0)
MONOS PCT: 12 %
Monocytes Absolute: 0.9 10*3/uL (ref 0.1–1.0)
Neutro Abs: 5.5 10*3/uL (ref 1.7–7.7)
Neutrophils Relative %: 70 %
Platelets: 223 10*3/uL (ref 150–400)
RBC: 3.63 MIL/uL — ABNORMAL LOW (ref 3.87–5.11)
RDW: 14.6 % (ref 11.5–15.5)
WBC: 7.7 10*3/uL (ref 4.0–10.5)
nRBC: 0 % (ref 0.0–0.2)

## 2019-01-25 LAB — COMPREHENSIVE METABOLIC PANEL
ALT: 44 U/L (ref 0–44)
AST: 34 U/L (ref 15–41)
Albumin: 2.6 g/dL — ABNORMAL LOW (ref 3.5–5.0)
Alkaline Phosphatase: 182 U/L — ABNORMAL HIGH (ref 38–126)
Anion gap: 7 (ref 5–15)
BUN: 20 mg/dL (ref 8–23)
CO2: 28 mmol/L (ref 22–32)
CREATININE: 1 mg/dL (ref 0.44–1.00)
Calcium: 9.1 mg/dL (ref 8.9–10.3)
Chloride: 103 mmol/L (ref 98–111)
GFR calc Af Amer: >60 mL/min (ref >60)
GFR calc non Af Amer: 56 mL/min — ABNORMAL LOW (ref >60)
Glucose, Bld: 97 mg/dL (ref 70–99)
Potassium: 4.2 mmol/L (ref 3.5–5.1)
Sodium: 138 mmol/L (ref 135–145)
Total Bilirubin: <0.2 mg/dL — ABNORMAL LOW (ref 0.3–1.2)
Total Protein: 6.1 g/dL — ABNORMAL LOW (ref 6.5–8.1)

## 2019-01-25 MED ORDER — PROCHLORPERAZINE MALEATE 10 MG PO TABS: 10.0000 mg | ORAL_TABLET | Freq: Four times a day (QID) | ORAL | 1 refills | Status: AC | PRN

## 2019-01-25 MED ORDER — DEXAMETHASONE 4 MG PO TABS: ORAL_TABLET | ORAL | 0 refills | Status: AC

## 2019-01-25 MED ORDER — LIDOCAINE-PRILOCAINE 2.5-2.5 % EX CREA: 1.0000 "application " | TOPICAL_CREAM | CUTANEOUS | 6 refills | Status: AC | PRN

## 2019-01-25 MED ORDER — ONDANSETRON HCL 8 MG PO TABS: 8.0000 mg | ORAL_TABLET | Freq: Three times a day (TID) | ORAL | 3 refills | Status: AC | PRN

## 2019-01-25 NOTE — Assessment & Plan Note (Signed)
She has recurrence of ascites on exam We will schedule ultrasound paracentesis again for symptomatic relief

## 2019-01-25 NOTE — Assessment & Plan Note (Signed)
This is likely anemia of chronic disease. The patient denies recent history of bleeding such as epistaxis, hematuria or hematochezia. She is asymptomatic from the anemia. We will observe for now.  

## 2019-01-25 NOTE — Telephone Encounter (Signed)
Gave avs and calendar ° °

## 2019-01-25 NOTE — Telephone Encounter (Signed)
[facility] and arranged for transportation for paracentesis on 01/27/19 with arrival at 8:30 and [facility].  Also left a message for patient's nurse regarding prescriptions for decadron and emla cream.    Called [facility] and verified that patient does not need to hold Lovenox before the paracentesis.  Discussed schedule and prescriptions with Aniella and advised her to call with any questions.

## 2019-01-25 NOTE — Assessment & Plan Note (Signed)
She has generalized edema due to third spacing and anasarca I do not recommend diuretic therapy

## 2019-01-25 NOTE — Assessment & Plan Note (Signed)
She tolerated cycle 1 of treatment well We will proceed with cycle 2 of treatment Her case was reviewed at the GYN oncology tumor board today The plan will be to restage her after 3 cycles of treatment to determine whether she is a candidate for interval debulking surgery or not We reviewed the NCCN guidelines We discussed the role of chemotherapy. The intent is of curative intent.  We discussed some of the risks, benefits, side-effects of carboplatin & Taxol. Treatment is intravenous, every 3 weeks x 6 cycles  Some of the short term side-effects included, though not limited to, including weight loss, life threatening infections, risk of allergic reactions, need for transfusions of blood products, nausea, vomiting, change in bowel habits, loss of hair, admission to hospital for various reasons, and risks of death.   Long term side-effects are also discussed including risks of infertility, permanent damage to nerve function, hearing loss, chronic fatigue, kidney damage with possibility needing hemodialysis, and rare secondary malignancy including bone marrow disorders.  The patient is aware that the response rates discussed earlier is not guaranteed.  After a long discussion, patient made an informed decision to proceed with the prescribed plan of care.   Patient education material was dispensed. We discussed premedication with dexamethasone before chemotherapy.

## 2019-01-25 NOTE — Progress Notes (Signed)
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 The intent is of curative intent.  We discussed some of the risks, benefits, side-effects of carboplatin & Taxol. Treatment is intravenous, every 3 weeks x 6 cycles  Some of the short term side-effects included, though not limited to, including weight loss, life threatening infections, risk of allergic reactions, need for transfusions of blood products, nausea, vomiting, change in bowel habits, loss of hair, admission to hospital for various reasons, and risks of death.   Long term side-effects are also discussed including risks of infertility, permanent damage to nerve function, hearing loss, chronic fatigue, kidney damage with possibility needing hemodialysis, and rare secondary malignancy including bone marrow disorders.  The patient is aware that the response rates discussed earlier is not guaranteed.  After a long discussion, patient made an informed decision to proceed with the prescribed plan of care.   Patient education material was dispensed. We discussed premedication with dexamethasone before chemotherapy.   Malignant ascites She has recurrence of ascites on exam We will schedule ultrasound paracentesis again for symptomatic relief  Anemia in neoplastic disease This is likely anemia of chronic disease. The patient denies recent history of bleeding such as epistaxis, hematuria or hematochezia. She is asymptomatic from the anemia. We will observe for now.    Acute DVT (deep venous thrombosis) ([place]) She will continue Lovenox For now, I do not recommend IVC filter removal  Anasarca She has generalized edema due to third spacing and anasarca I do not recommend diuretic therapy   Orders Placed This Encounter  Procedures  . IR Paracentesis    Standing Status:   Future    Standing Expiration Date:   01/25/2020    Order Specific Question:   If therapeutic, is there a maximum amount of fluid to be removed?    Answer:   No    Order Specific Question:   Are labs required for specimen collection?    Answer:   No    Order Specific Question:   Is Albumin medication needed?    Answer:   No    Order Specific Question:   Reason for Exam (SYMPTOM  OR DIAGNOSIS REQUIRED)    Answer:   malignant ascites    Order Specific Question:   Preferred Imaging Location?    Answer:   [facility]    INTERVAL HISTORY: Please see below for problem oriented charting. 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 Left: Findings consistent with acute deep vein thrombosis involving the left common femoral vein, and left femoral vein. Findings consistent with acute superficial vein thrombosis involving the left great  saphenous vein. Findings consistent with age indeterminate deep vein thrombosis involving the left proximal profunda vein, left popliteal vein, left posterior tibial vein, and left peroneal vein. Left external iliac vein appears thrombosied. IVC was not viualized. No cystic structure found in the popliteal fossa.    12/27/2018  Imaging    Ct scan of chest, abdomen and pelvis showed: 1. 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 This is associated with a very large volume of (presumably malignant) ascites. 2. Multiple tiny 1-3 mm pulmonary nodules scattered throughout the lungs bilaterally. These are nonspecific, but favored to represent benign areas of mucoid impaction within terminal bronchioles. However, close attention on follow-up studies is recommended to ensure the stability or resolution of this finding, as the possibility of metastatic disease is not excluded. 3. Aortic atherosclerosis, in addition to left anterior descending coronary artery disease. Assessment for potential risk factor modification, dietary therapy or pharmacologic therapy may be warranted, if clinically indicated.     12/27/2018 Tumor Marker    Patient's tumor was tested for the following markers: CA-125. Results of the tumor marker test revealed 40.2.    12/27/2018 - 01/06/2019 Hospital Admission    She was admitted to the hospital and was diagnosed with DVT and ovarian cancer    12/28/2018 Pathology Results    PERITONEAL/ASCITIC FLUID (SPECIMEN 1 OF 1 COLLECTED 12/28/18): NON-SMALL CELL CARCINOMA, FAVOR ADENOCARCINOMA.    12/28/2018 Procedure    Successful ultrasound-guided diagnostic and therapeutic paracentesis yielding 5 liters of peritoneal fluid.    12/30/2018 Echocardiogram    Left ventricle: The cavity size was normal. Wall thickness was normal. Systolic function was normal. The estimated ejection fraction was in the range of 60% to 65%. Wall motion was normal; there were no regional wall motion abnormalities. Doppler parameters are consistent with abnormal left ventricular relaxation (grade 1 diastolic dysfunction).    12/31/2018 Procedure    Successful ultrasound-guided therapeutic paracentesis yielding 9.3 liters of peritoneal  fluid.     12/31/2018 Procedure    This IVC filter is potentially retrievable. The patient will be assessed for filter retrieval by Interventional Radiology in approximately 8-12 weeks. Further recommendations regarding filter retrieval, continued surveillance or declaration of device permanence, will be made at that time.     01/04/2019 -  Chemotherapy    The patient had carboplatin and taxol for chemotherapy treatment.     01/25/2019 Cancer Staging    Staging form: Ovary, Fallopian Tube, and Primary Peritoneal Carcinoma, AJCC 8th Edition - Clinical: cT3, cN0, cM0 - Signed by Heath Lark, MD on 01/25/2019     REVIEW OF SYSTEMS:   Constitutional: Denies fevers, chills or abnormal weight loss Eyes: Denies blurriness of vision Ears, nose, mouth, throat, and face: Denies mucositis or sore throat Respiratory: Denies cough, dyspnea or wheezes Skin: Denies abnormal skin rashes Lymphatics: Denies new lymphadenopathy or easy bruising Neurological:Denies numbness, tingling or new weaknesses Behavioral/Psych: Mood is stable, no new changes  All other systems were reviewed with the patient and are negative.  I have reviewed the past medical history, past surgical history, social history and family history with the patient and they are unchanged from previous note.  ALLERGIES:  is allergic to penicillins.  MEDICATIONS:  Current Outpatient Medications  Medication Sig Dispense Refill  . bismuth subsalicylate (PEPTO BISMOL) 262 MG/15ML suspension Take 30 mLs by mouth every 4 (four) hours as needed for indigestion.    Marland Kitchen  dexamethasone (DECADRON) 4 MG tablet Take 3 tabs at the night before and 3 tab the morning of chemotherapy, every 3 weeks, by mouth 60 tablet 0  . enoxaparin (LOVENOX) 80 MG/0.8ML injection Inject 0.8 mLs (80 mg total) into the skin every 12 (twelve) hours. 22.4 Syringe 1  . lidocaine-prilocaine (EMLA) cream Apply 1 application topically as needed. 30 g 6  . lip balm (CARMEX)  ointment Apply topically as needed for lip care. 7 g 0  . Nutritional Supplements (NUTRITIONAL SUPPLEMENT PO) Take 1 each by mouth 2 (two) times daily. Magic Cup    . ondansetron (ZOFRAN) 8 MG tablet Take 1 tablet (8 mg total) by mouth every 8 (eight) hours as needed for nausea. 30 tablet 3  . prochlorperazine (COMPAZINE) 10 MG tablet Take 1 tablet (10 mg total) by mouth every 6 (six) hours as needed for nausea or vomiting. 30 tablet 1   No current facility-administered medications for this visit.     PHYSICAL EXAMINATION: ECOG PERFORMANCE STATUS: 2 - Symptomatic, <50% confined to bed  Vitals:   01/25/19 1426  BP: (!) 125/54  Pulse: 96  Resp: 18  Temp: 97.7 F (36.5 C)  SpO2: 99%   There were no vitals filed for this visit.  GENERAL:alert, no distress and comfortable SKIN: skin color, texture, turgor are normal, no rashes or significant lesions EYES: normal, Conjunctiva are pink and non-injected, sclera clear OROPHARYNX:no exudate, no erythema and lips, buccal mucosa, and tongue normal  NECK: supple, thyroid normal size, non-tender, without nodularity LYMPH:  no palpable lymphadenopathy in the cervical, axillary or inguinal LUNGS: clear to auscultation and percussion with normal breathing effort HEART: regular rate & rhythm and no murmurs with moderate bilateral lower extremity edema ABDOMEN:abdomen soft, non-tender and normal bowel sounds.  She has significant abdominal distention from ascites  musculoskeletal:no cyanosis of digits and no clubbing  NEURO: alert & oriented x 3 with fluent speech, no focal motor/sensory deficits  LABORATORY DATA:  I have reviewed the data as listed    Component Value Date/Time   NA 138 01/25/2019 1354   NA 141 01/18/2019   K 4.2 01/25/2019 1354   CL 103 01/25/2019 1354   CO2 28 01/25/2019 1354   GLUCOSE 97 01/25/2019 1354   BUN 20 01/25/2019 1354   BUN 17 01/18/2019   CREATININE 1.00 01/25/2019 1354   CALCIUM 9.1 01/25/2019 1354   PROT  6.1 (L) 01/25/2019 1354   ALBUMIN 2.6 (L) 01/25/2019 1354   AST 34 01/25/2019 1354   ALT 44 01/25/2019 1354   ALKPHOS 182 (H) 01/25/2019 1354   BILITOT <0.2 (L) 01/25/2019 1354   GFRNONAA 56 (L) 01/25/2019 1354   GFRAA >60 01/25/2019 1354    No results found for: SPEP, UPEP  Lab Results  Component Value Date   WBC 7.7 01/25/2019   NEUTROABS 5.5 01/25/2019   HGB 10.7 (L) 01/25/2019   HCT 32.5 (L) 01/25/2019   MCV 89.5 01/25/2019   PLT 223 01/25/2019      Chemistry      Component Value Date/Time   NA 138 01/25/2019 1354   NA 141 01/18/2019   K 4.2 01/25/2019 1354   CL 103 01/25/2019 1354   CO2 28 01/25/2019 1354   BUN 20 01/25/2019 1354   BUN 17 01/18/2019   CREATININE 1.00 01/25/2019 1354   GLU 110 01/18/2019      Component Value Date/Time   CALCIUM 9.1 01/25/2019 1354   ALKPHOS 182 (H) 01/25/2019 1354   AST  34 01/25/2019 1354   ALT 44 01/25/2019 1354   BILITOT <0.2 (L) 01/25/2019 1354       RADIOGRAPHIC STUDIES: I have personally reviewed the radiological images as listed and agreed with the findings in the report. Ct Abdomen Pelvis Wo Contrast  Result Date: 12/27/2018 CLINICAL DATA:  73 year old female with history of abdominal distension. Evaluate for potential mass. EXAM: CT CHEST, ABDOMEN AND PELVIS WITHOUT CONTRAST TECHNIQUE: Multidetector CT imaging of the chest, abdomen and pelvis was performed following the standard protocol without IV contrast. COMPARISON:  None. FINDINGS: CT CHEST FINDINGS Cardiovascular: Heart size is normal. There is no significant pericardial fluid, thickening or pericardial calcification. Atherosclerotic calcifications in the left anterior descending coronary artery. Mediastinum/Nodes: No pathologically enlarged mediastinal or hilar lymph nodes. Moderate-sized hiatal hernia. No axillary lymphadenopathy. Lungs/Pleura: Mild diffuse bronchial wall thickening with mild centrilobular and paraseptal emphysema. Innumerable tiny 1-3 mm  pulmonary nodules scattered throughout the periphery of the lungs bilaterally, nonspecific. No larger more suspicious appearing pulmonary nodules or masses are noted. No acute consolidative airspace disease. No pleural effusions. Areas of linear scarring noted in the right middle lobe, inferior segment of the lingula and anterior aspect of the left lower lobe. Musculoskeletal: There are no aggressive appearing lytic or blastic lesions noted in the visualized portions of the skeleton. CT ABDOMEN PELVIS FINDINGS Hepatobiliary: No definite cystic or solid hepatic lesions are confidently identified on today's noncontrast CT examination. Gallbladder is not confidently identified may be surgically absent, or may be obscured by adjacent ascites. Pancreas: No definite pancreatic mass noted on today's noncontrast CT examination. No definite surrounding peripancreatic inflammatory changes. Spleen: Unremarkable. Adrenals/Urinary Tract: Unenhanced appearance of the kidneys and bilateral adrenal glands is normal. No hydroureteronephrosis. Urinary bladder is poorly demonstrated, but unremarkable in appearance. Stomach/Bowel: Normal appearance of the stomach. No pathologic dilatation of small bowel or colon. Appendix is not confidently identified. Vascular/Lymphatic: Aortic atherosclerosis. No lymphadenopathy identified in the abdomen or pelvis on today's noncontrast CT examination. Reproductive: Unenhanced appearance of the uterus is unremarkable. In the central abdomen and pelvis there is a very large centrally low-attenuation rim enhancing mass (axial image 85 of series 2 and sagittal image 92 of series 7) measuring 28.7 x 25.6 x 25.6 cm. This appears potentially related to the right adnexal region, although the origin of this lesion is uncertain. Left ovary is not confidently identified. Other: Large volume of ascites.  No pneumoperitoneum. Musculoskeletal: There are no aggressive appearing lytic or blastic lesions noted in  the visualized portions of the skeleton. IMPRESSION: 1. 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 This is associated with a very large volume of (presumably malignant) ascites. 2. Multiple tiny 1-3 mm pulmonary nodules scattered throughout the lungs bilaterally. These are nonspecific, but favored to represent benign areas of mucoid impaction within terminal bronchioles. However, close attention on follow-up studies is recommended to ensure the stability or resolution of this finding, as the possibility of metastatic disease is not excluded. 3. Aortic atherosclerosis, in addition to left anterior descending coronary artery disease. Assessment for potential risk factor modification, dietary therapy or pharmacologic therapy may be warranted, if clinically indicated. Electronically Signed   By: Vinnie Langton M.D.   On: 12/27/2018 13:41   Ct Chest Wo Contrast  Result Date: 12/27/2018 CLINICAL DATA:  73 year old female with history of abdominal distension. Evaluate for potential mass. EXAM: CT CHEST, ABDOMEN AND PELVIS WITHOUT CONTRAST  TECHNIQUE: Multidetector CT imaging of the chest, abdomen and pelvis was performed following the standard protocol without IV contrast. COMPARISON:  None. FINDINGS: CT CHEST FINDINGS Cardiovascular: Heart size is normal. There is no significant pericardial fluid, thickening or pericardial calcification. Atherosclerotic calcifications in the left anterior descending coronary artery. Mediastinum/Nodes: No pathologically enlarged mediastinal or hilar lymph nodes. Moderate-sized hiatal hernia. No axillary lymphadenopathy. Lungs/Pleura: Mild diffuse bronchial wall thickening with mild centrilobular and paraseptal emphysema. Innumerable tiny 1-3 mm pulmonary nodules scattered throughout the periphery of the lungs bilaterally, nonspecific. No larger more suspicious appearing pulmonary  nodules or masses are noted. No acute consolidative airspace disease. No pleural effusions. Areas of linear scarring noted in the right middle lobe, inferior segment of the lingula and anterior aspect of the left lower lobe. Musculoskeletal: There are no aggressive appearing lytic or blastic lesions noted in the visualized portions of the skeleton. CT ABDOMEN PELVIS FINDINGS Hepatobiliary: No definite cystic or solid hepatic lesions are confidently identified on today's noncontrast CT examination. Gallbladder is not confidently identified may be surgically absent, or may be obscured by adjacent ascites. Pancreas: No definite pancreatic mass noted on today's noncontrast CT examination. No definite surrounding peripancreatic inflammatory changes. Spleen: Unremarkable. Adrenals/Urinary Tract: Unenhanced appearance of the kidneys and bilateral adrenal glands is normal. No hydroureteronephrosis. Urinary bladder is poorly demonstrated, but unremarkable in appearance. Stomach/Bowel: Normal appearance of the stomach. No pathologic dilatation of small bowel or colon. Appendix is not confidently identified. Vascular/Lymphatic: Aortic atherosclerosis. No lymphadenopathy identified in the abdomen or pelvis on today's noncontrast CT examination. Reproductive: Unenhanced appearance of the uterus is unremarkable. In the central abdomen and pelvis there is a very large centrally low-attenuation rim enhancing mass (axial image 85 of series 2 and sagittal image 92 of series 7) measuring 28.7 x 25.6 x 25.6 cm. This appears potentially related to the right adnexal region, although the origin of this lesion is uncertain. Left ovary is not confidently identified. Other: Large volume of ascites.  No pneumoperitoneum. Musculoskeletal: There are no aggressive appearing lytic or blastic lesions noted in the visualized portions of the skeleton. IMPRESSION: 1. 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 This is associated with a very large volume of (presumably malignant) ascites. 2. Multiple tiny 1-3 mm pulmonary nodules scattered throughout the lungs bilaterally. These are nonspecific, but favored to represent benign areas of mucoid impaction within terminal bronchioles. However, close attention on follow-up studies is recommended to ensure the stability or resolution of this finding, as the possibility of metastatic disease is not excluded. 3. Aortic atherosclerosis, in addition to left anterior descending coronary artery disease. Assessment for potential risk factor modification, dietary therapy or pharmacologic therapy may be warranted, if clinically indicated. Electronically Signed   By: Vinnie Langton M.D.   On: 12/27/2018 13:41   Ir Ivc Filter Plmt / S&i /img Guid/mod Sed  Result Date: 12/31/2018 INDICATION: 73 year old with a large central abdominal and pelvic mass. EXAM: IVC FILTER PLACEMENT; IVC VENOGRAM; ULTRASOUND FOR VASCULAR ACCESS Physician: Stephan Minister. Anselm Pancoast, MD MEDICATIONS: None. ANESTHESIA/SEDATION: Fentanyl 100 mcg IV; Versed 2.0 mg IV Moderate Sedation Time:  26 minutes minutes The patient was continuously monitored during the procedure by the interventional radiology nurse under my direct supervision. CONTRAST:  Carbon dioxide FLUOROSCOPY TIME:  Fluoroscopy Time: 4 minutes 48 seconds (437 mGy). COMPLICATIONS: None immediate. PROCEDURE: The procedure was explained to the patient. The risks and  benefits of the procedure were discussed and the patient's questions were addressed. Informed consent was obtained from the patient. Ultrasound demonstrated a patent right internal jugular vein. Ultrasound images were obtained for documentation. The right side of the neck was prepped and draped in a sterile fashion. Maximal barrier sterile technique was utilized including caps, mask, sterile gowns, sterile gloves, sterile drape, hand hygiene and  skin antiseptic. The skin was anesthetized with 1% lidocaine. A 21 gauge needle was directed into the vein with ultrasound guidance and a micropuncture dilator set was placed. A wire was advanced into the IVC. The filter sheath was advanced over the wire into the IVC. An IVC venogram was performed with carbon dioxide due to renal insufficiency. Fluoroscopic images were obtained for documentation. Renal veins were not confidently identified with carbon dioxide. Therefore, 5 French catheter and Bentson wire were used to cannulate the bilateral renal veins. A Bard Denali filter was deployed below the lowest renal vein. Left renal vein was again cannulated with a catheter and wire to ensure placement below the renal vein. Final spot radiograph images were obtained. Vascular sheath was removed with manual compression. Bandage placed over the puncture site. FINDINGS: IVC is small but patent. Bilateral renal veins were identified by cannulating the veins with a catheter and wire. The filter was deployed below the lowest renal vein. IMPRESSION: Successful placement of a retrievable IVC filter. PLAN: This IVC filter is potentially retrievable. The patient will be assessed for filter retrieval by Interventional Radiology in approximately 8-12 weeks. Further recommendations regarding filter retrieval, continued surveillance or declaration of device permanence, will be made at that time. Electronically Signed   By: Markus Daft M.D.   On: 12/31/2018 13:28   US Paracentesis  Result Date: 01/08/2019 INDICATION: Patient with history of advanced ovarian cancer with malignant ascites. Request for therapeutic paracentesis today. EXAM: ULTRASOUND GUIDED THERAPEUTIC PARACENTESIS MEDICATIONS: 10 mL 1% lidocaine COMPLICATIONS: None immediate. PROCEDURE: Informed written consent was obtained from the patient after a discussion of the risks, benefits and alternatives to treatment. A timeout was performed prior to the initiation of the  procedure. Initial ultrasound scanning demonstrates a moderate amount of ascites within the left lower abdominal quadrant. The left lower abdomen was prepped and draped in the usual sterile fashion. 1% lidocaine was used for local anesthesia. Following this, a 19 gauge, 7-cm, Yueh catheter was introduced. An ultrasound image was saved for documentation purposes. The paracentesis was performed. The catheter was removed and a dressing was applied. The patient tolerated the procedure well without immediate post procedural complication. FINDINGS: A total of approximately 3.3 L of hazy yellow fluid was removed. IMPRESSION: Successful ultrasound-guided paracentesis yielding 3.3 liters of peritoneal fluid. Read by Cyndee Brightly Electronically Signed   By: Jerilynn Mages.  Shick M.D.   On: 01/08/2019 14:31   US Paracentesis  Result Date: 01/01/2019 INDICATION: Patient with history of metastatic adenocarcinoma, abdominopelvic mass, recurrent malignant ascites. Request made for therapeutic paracentesis. EXAM: ULTRASOUND GUIDED THERAPEUTIC PARACENTESIS MEDICATIONS: None COMPLICATIONS: None immediate. PROCEDURE: Informed written consent was obtained from the patient after a discussion of the risks, benefits and alternatives to treatment. A timeout was performed prior to the initiation of the procedure. Initial ultrasound scanning demonstrates a large amount of ascites within the left lower abdominal quadrant. The left lower abdomen was prepped and draped in the usual sterile fashion. 1% lidocaine was used for local anesthesia. Following this, a 19 gauge, 10-cm, Yueh catheter was introduced. An ultrasound image was saved for documentation purposes. The  paracentesis was performed. The catheter was removed and a dressing was applied. The patient tolerated the procedure well without immediate post procedural complication. FINDINGS: A total of approximately 9.3 liters of slightly hazy, yellow fluid was removed. IMPRESSION: Successful  ultrasound-guided therapeutic paracentesis yielding 9.3 liters of peritoneal fluid. Read by: Rowe Robert, PA-C Electronically Signed   By: Markus Daft M.D.   On: 12/31/2018 17:38   US Paracentesis  Result Date: 12/28/2018 INDICATION: Patient with history of lower extremity DVT, abdomino-pelvic mass, renal insufficiency, ascites ; request made for diagnostic and therapeutic paracentesis up to 5 liters. EXAM: ULTRASOUND GUIDED DIAGNOSTIC AND THERAPEUTIC PARACENTESIS MEDICATIONS: None COMPLICATIONS: None immediate. PROCEDURE: Informed written consent was obtained from the patient after a discussion of the risks, benefits and alternatives to treatment. A timeout was performed prior to the initiation of the procedure. Initial ultrasound scanning demonstrates a large amount of ascites within the left mid to lower abdominal quadrant. The left mid to lower abdomen was prepped and draped in the usual sterile fashion. 1% lidocaine was used for local anesthesia. Following this, a 19 gauge, 7-cm, Yueh catheter was introduced. An ultrasound image was saved for documentation purposes. The paracentesis was performed. The catheter was removed and a dressing was applied. The patient tolerated the procedure well without immediate post procedural complication. FINDINGS: A total of approximately 5 liters of slightly hazy, yellow fluid was removed. Samples were sent to the laboratory as requested by the clinical team. IMPRESSION: Successful ultrasound-guided diagnostic and therapeutic paracentesis yielding 5 liters of peritoneal fluid. Read by: Rowe Robert, PA-C Electronically Signed   By: Sandi Mariscal M.D.   On: 12/28/2018 15:45   Dg Chest Portable 1 View  Result Date: 12/27/2018 CLINICAL DATA:  Abdominal distension and leg swelling EXAM: PORTABLE CHEST 1 VIEW COMPARISON:  None. FINDINGS: Monitoring leads overlie the patient. Normal cardiac and mediastinal contours. No consolidative pulmonary opacities. No pleural effusion or  pneumothorax. Thoracic spine degenerative changes. IMPRESSION: No active disease. Electronically Signed   By: Lovey Newcomer M.D.   On: 12/27/2018 11:48   Vas [place] Lower Extremity Venous (dvt) (only [facility])  Result Date: 12/29/2018  Lower Venous Study Indications: Pain, Swelling, and Edema.  Limitations: Body habitus and tightenedness due to edema. Comparison Study: No prior study available. Performing Technologist: Lorina Rabon  Examination Guidelines: A complete evaluation includes B-mode imaging, spectral Doppler, color Doppler, and power Doppler as needed of all accessible portions of each vessel. Bilateral testing is considered an integral part of a complete examination. Limited examinations for reoccurring indications may be performed as noted.  Right Venous Findings: +---+---------------+---------+-----------+----------+-------+    CompressibilityPhasicitySpontaneityPropertiesSummary +---+---------------+---------+-----------+----------+-------+ CFVFull           Yes      Yes                          +---+---------------+---------+-----------+----------+-------+  Left Venous Findings: +---------+---------------+---------+-----------+----------+-----------------+          CompressibilityPhasicitySpontaneityPropertiesSummary           +---------+---------------+---------+-----------+----------+-----------------+ CFV      None           No       No                   Acute             +---------+---------------+---------+-----------+----------+-----------------+ SFJ      None  Acute             +---------+---------------+---------+-----------+----------+-----------------+ FV Prox  None                                         Acute             +---------+---------------+---------+-----------+----------+-----------------+ FV Mid   None                                         Acute              +---------+---------------+---------+-----------+----------+-----------------+ FV DistalNone                                         Acute             +---------+---------------+---------+-----------+----------+-----------------+ PFV      None                                         Age Indeterminate +---------+---------------+---------+-----------+----------+-----------------+ POP      None                                         Age Indeterminate +---------+---------------+---------+-----------+----------+-----------------+ PTV      None                                         Age Indeterminate +---------+---------------+---------+-----------+----------+-----------------+ PERO     None                                         Age Indeterminate +---------+---------------+---------+-----------+----------+-----------------+ GSV      None                                         Acute             +---------+---------------+---------+-----------+----------+-----------------+ EIV      None           No                            Acute             +---------+---------------+---------+-----------+----------+-----------------+  Summary: Right: No evidence of common femoral vein obstruction. Left: Findings consistent with acute deep vein thrombosis involving the left common femoral vein, and left femoral vein. Findings consistent with acute superficial vein thrombosis involving the left great saphenous vein. Findings consistent with age indeterminate deep vein thrombosis involving the left proximal profunda vein, left popliteal vein, left posterior tibial vein, and left peroneal vein. Left external iliac vein appears thrombosied. IVC was not viualized. No cystic structure found in the popliteal fossa.  *See table(s) above for measurements and observations. Electronically  signed by Deitra Mayo MD on 12/29/2018 at 20:38:59 AM.    Final     All questions were answered.  The patient knows to call the clinic with any problems, questions or concerns. No barriers to learning was detected.  I spent 40 minutes counseling the patient face to face. The total time spent in the appointment was 55 minutes and more than 50% was on counseling and review of test results  Heath Lark, MD 01/25/2019 3:51 PM

## 2019-01-25 NOTE — Assessment & Plan Note (Signed)
She will continue Lovenox For now, I do not recommend IVC filter removal

## 2019-01-25 NOTE — Progress Notes (Signed)
Gynecologic Oncology Multi-Disciplinary Disposition Conference Note  Date of the Conference: 01/25/2019  Patient Name: Haley Palmer  Referring Provider: Primary GYN Oncologist:  Stage/Disposition:  Disposition is to neoadjuvant chemotherapy with carboplatin and paclitaxel for 3 cycles then reassessment for possible surgical debulking..   This Multidisciplinary conference took place involving physicians from [place], Medical Oncology, Radiation Oncology, Pathology, Radiology along with the Gynecologic Oncology Nurse Practitioner and RN.  Comprehensive assessment of the patient's malignancy, staging, need for surgery, chemotherapy, radiation therapy, and need for further testing were reviewed. Supportive measures, both inpatient and following discharge were also discussed. The recommended plan of care is documented. Greater than 35 minutes were spent correlating and coordinating this patient's care.

## 2019-01-26 LAB — CA 125: Cancer Antigen (CA) 125: 24.9 U/mL (ref 0.0–38.1)

## 2019-01-27 ENCOUNTER — Encounter (HOSPITAL_COMMUNITY): Payer: Self-pay | Admitting: Physician Assistant

## 2019-01-27 ENCOUNTER — Ambulatory Visit (HOSPITAL_COMMUNITY)
Admission: RE | Admit: 2019-01-27 | Discharge: 2019-01-27 | Disposition: A | Discharge status: Home / Self Care | Payer: Self-pay | Source: Ambulatory Visit | Attending: Hematology and Oncology | Admitting: Hematology and Oncology

## 2019-01-27 ENCOUNTER — Ambulatory Visit (HOSPITAL_COMMUNITY): Admission: RE | Admit: 2019-01-27 | Payer: Medicare Other | Source: Ambulatory Visit

## 2019-01-27 DIAGNOSIS — R180 Malignant ascites: Secondary | ICD-10-CM | POA: Insufficient documentation

## 2019-01-27 HISTORY — PX: IR PARACENTESIS: IMG2679

## 2019-01-27 MED ORDER — LIDOCAINE HCL 1 % IJ SOLN
INTRAMUSCULAR | Status: AC
  Filled 2019-01-27: qty 20

## 2019-01-27 MED ORDER — LIDOCAINE HCL 1 % IJ SOLN
INTRAMUSCULAR | Status: DC | PRN
  Administered 2019-01-27: 8 mL via INTRADERMAL

## 2019-01-27 NOTE — Procedures (Signed)
PROCEDURE SUMMARY:  Successful US guided paracentesis from left lateral abdomen.  Yielded 3.7 L of clear yellow fluid.  No immediate complications.  Patient tolerated well.  EBL = trace  Haley Palmer S Tarius Stangelo PA-C 01/27/2019 11:22 AM

## 2019-01-28 ENCOUNTER — Inpatient Hospital Stay: Payer: Self-pay

## 2019-01-28 VITALS — BP 146/80 | HR 96 | Temp 97.6°F | Resp 18

## 2019-01-28 DIAGNOSIS — C569 Malignant neoplasm of unspecified ovary: Secondary | ICD-10-CM

## 2019-01-28 MED ORDER — SODIUM CHLORIDE 0.9 % IV SOLN
Freq: Once | INTRAVENOUS | Status: AC
  Administered 2019-01-28: 09:00:00 via INTRAVENOUS
  Filled 2019-01-28: qty 250

## 2019-01-28 MED ORDER — DIPHENHYDRAMINE HCL 50 MG/ML IJ SOLN
50.0000 mg | Freq: Once | INTRAMUSCULAR | Status: AC
  Administered 2019-01-28: 50 mg via INTRAVENOUS

## 2019-01-28 MED ORDER — PALONOSETRON HCL INJECTION 0.25 MG/5ML
INTRAVENOUS | Status: AC
  Filled 2019-01-28: qty 5

## 2019-01-28 MED ORDER — DIPHENHYDRAMINE HCL 50 MG/ML IJ SOLN
INTRAMUSCULAR | Status: AC
  Filled 2019-01-28: qty 1

## 2019-01-28 MED ORDER — PALONOSETRON HCL INJECTION 0.25 MG/5ML
0.2500 mg | Freq: Once | INTRAVENOUS | Status: AC
  Administered 2019-01-28: 0.25 mg via INTRAVENOUS

## 2019-01-28 MED ORDER — SODIUM CHLORIDE 0.9 % IV SOLN
Freq: Once | INTRAVENOUS | Status: AC
  Administered 2019-01-28: 09:00:00 via INTRAVENOUS
  Filled 2019-01-28: qty 5

## 2019-01-28 MED ORDER — FAMOTIDINE IN NACL 20-0.9 MG/50ML-% IV SOLN
20.0000 mg | Freq: Once | INTRAVENOUS | Status: AC
  Administered 2019-01-28: 20 mg via INTRAVENOUS

## 2019-01-28 MED ORDER — SODIUM CHLORIDE 0.9 % IV SOLN
480.0000 mg | Freq: Once | INTRAVENOUS | Status: AC
  Administered 2019-01-28: 480 mg via INTRAVENOUS
  Filled 2019-01-28: qty 48

## 2019-01-28 MED ORDER — FAMOTIDINE IN NACL 20-0.9 MG/50ML-% IV SOLN
INTRAVENOUS | Status: AC
  Filled 2019-01-28: qty 50

## 2019-01-28 MED ORDER — SODIUM CHLORIDE 0.9 % IV SOLN
175.0000 mg/m2 | Freq: Once | INTRAVENOUS | Status: AC
  Administered 2019-01-28: 342 mg via INTRAVENOUS
  Filled 2019-01-28: qty 57

## 2019-01-28 NOTE — Patient Instructions (Signed)
[facility] Discharge Instructions for Patients Receiving Chemotherapy  Today you received the following chemotherapy agents: paclitaxel (Taxol), carboplatin (Paraplatin)  To help prevent nausea and vomiting after your treatment, we encourage you to take your nausea medication as directed by your physician.   If you develop nausea and vomiting that is not controlled by your nausea medication, call the clinic.   BELOW ARE SYMPTOMS THAT SHOULD BE REPORTED IMMEDIATELY:  *FEVER GREATER THAN 100.5 F  *CHILLS WITH OR WITHOUT FEVER  NAUSEA AND VOMITING THAT IS NOT CONTROLLED WITH YOUR NAUSEA MEDICATION  *UNUSUAL SHORTNESS OF BREATH  *UNUSUAL BRUISING OR BLEEDING  TENDERNESS IN MOUTH AND THROAT WITH OR WITHOUT PRESENCE OF ULCERS  *URINARY PROBLEMS  *BOWEL PROBLEMS  UNUSUAL RASH Items with * indicate a potential emergency and should be followed up as soon as possible.  Feel free to call the clinic should you have any questions or concerns. The clinic phone number is [phone].  Please show the [place] [facility] at check-in to the Emergency Department and triage nurse.

## 2019-01-29 ENCOUNTER — Non-Acute Institutional Stay (SKILLED_NURSING_FACILITY): Payer: Self-pay | Admitting: Adult Health

## 2019-01-29 ENCOUNTER — Encounter: Payer: Self-pay | Admitting: Adult Health

## 2019-01-29 DIAGNOSIS — D630 Anemia in neoplastic disease: Secondary | ICD-10-CM

## 2019-01-29 DIAGNOSIS — C569 Malignant neoplasm of unspecified ovary: Secondary | ICD-10-CM

## 2019-01-29 DIAGNOSIS — I82412 Acute embolism and thrombosis of left femoral vein: Secondary | ICD-10-CM

## 2019-01-29 DIAGNOSIS — Z20828 Contact with and (suspected) exposure to other viral communicable diseases: Secondary | ICD-10-CM

## 2019-01-29 DIAGNOSIS — K5901 Slow transit constipation: Secondary | ICD-10-CM

## 2019-01-29 NOTE — Progress Notes (Signed)
Location:  [facility] Room Number: 213-A Place of Service:  SNF (31) Provider:  Durenda Age, NP  Patient Care Team: Patient, No Pcp Per as PCP - General (General Practice)  Extended Emergency Contact Information Primary Emergency Contact: Lupton,THOMAS Address: [place],  [place] Home Phone: [phone] Relation: None  Code Status:  Full Code  Goals of care: Advanced Directive information Advanced Directives 01/01/2019  Does Patient Have a Medical Advance Directive? No  Would patient like information on creating a medical advance directive? No - Patient declined     Chief Complaint  Patient presents with  . Medical Management of Chronic Issues    Routine rehabilitation visit at [facility]    HPI:  Pt is a 73 y.o. female seen today for a routine rehabilitation visit in [facility].  She is a short-term rehabilitation resident.  She has a PMH of AKI, DVT, and ovarian cancer. She was seen in the room today and complained of constipation. She had the second infusion yesterday, 01/28/19, for Ovarian cancer. Her next infusion will be on 02/18/19 at the [facility].   She has been admitted to [facility] on 01/08/19 following an admission at [facility] 12/27/2018 -01/08/2019 for DVT, AKI and malignant neoplasm of the ovary.  IVC filter placed by interventional radiology on 12/31/2018.  She was a started on Lovenox with no stop date at this time and will follow-up with Dr. Alvy Bimler, hematology.  She had paracentesis done on 12/30 and removed 5 L and another 9.3 L on 12/31/2018.  She follows up with Dr. Harrington Challenger, [place].   Past Medical History:  Diagnosis Date  . AKI (acute kidney injury) ([place]) 12/2018  . DVT (deep venous thrombosis) ([place]) 12/2018  . Ovarian cancer [facility])    Past Surgical History:  Procedure Laterality Date  . CESAREAN SECTION    . Cyst removal on Left Wrist Left     . IR IVC FILTER PLMT / S&I /IMG GUID/MOD SED  12/31/2018  . IR PARACENTESIS  01/27/2019  . Surgery on Right Wrist Right 2006  . TONSILLECTOMY      Allergies  Allergen Reactions  . Penicillins Rash    DID THE REACTION INVOLVE: Swelling of the face/tongue/throat, SOB, or low BP? No Sudden or severe rash/hives, skin peeling, or the inside of the mouth or nose? No Did it require medical treatment? No When did it last happen?childhood allergy If all above answers are "NO", may proceed with cephalosporin use.     Outpatient Encounter Medications as of 01/29/2019  Medication Sig  . bismuth subsalicylate (PEPTO BISMOL) 262 MG/15ML suspension Take 30 mLs by mouth every 4 (four) hours as needed for indigestion.  Marland Kitchen dexamethasone (DECADRON) 4 MG tablet Take 3 tabs at the night before and 3 tab the morning of chemotherapy, every 3 weeks, by mouth  . enoxaparin (LOVENOX) 80 MG/0.8ML injection Inject 0.8 mLs (80 mg total) into the skin every 12 (twelve) hours.  . lidocaine-prilocaine (EMLA) cream Apply 1 application topically as needed.  . Nutritional Supplements (NUTRITIONAL SUPPLEMENT PO) Take 1 each by mouth 2 (two) times daily. Magic Cup  . ondansetron (ZOFRAN) 8 MG tablet Take 1 tablet (8 mg total) by mouth every 8 (eight) hours as needed for nausea.  Marland Kitchen oseltamivir (TAMIFLU) 75 MG capsule Take 75 mg by mouth daily.  . prochlorperazine (COMPAZINE) 10 MG tablet Take 1 tablet (10  mg total) by mouth every 6 (six) hours as needed for nausea or vomiting.  . [DISCONTINUED] lip balm (CARMEX) ointment Apply topically as needed for lip care.   No facility-administered encounter medications on file as of 01/29/2019.     Review of Systems  GENERAL: No change in appetite, no fatigue, no weight changes, no fever, chills or weakness MOUTH and THROAT: Denies oral discomfort, gingival pain or bleeding, pain from teeth or hoarseness   RESPIRATORY: no cough, SOB, DOE, wheezing, hemoptysis CARDIAC: No  chest pain, edema or palpitations GI: +constipation GU: Denies dysuria, frequency, hematuria, incontinence, or discharge PSYCHIATRIC: Denies feelings of depression or anxiety. No report of hallucinations, insomnia, paranoia, or agitation   Immunization History  Administered Date(s) Administered  . Pneumococcal Polysaccharide-23 01/07/2019   Pertinent  Health Maintenance Due  Topic Date Due  . MAMMOGRAM  07/21/1995  . COLONOSCOPY  07/21/1995  . DEXA SCAN  07/20/2010  . INFLUENZA VACCINE  07/30/2018  . PNA vac Low Risk Adult (2 of 2 - PCV13) 01/08/2020      Vitals:   01/29/19 0935  BP: 124/74  Pulse: 82  Resp: 19  Temp: 98.9 F (37.2 C)  TempSrc: Oral  SpO2: 98%  Weight: 181 lb 6.4 oz (82.3 kg)  Height: 5\' 7"  (1.702 m)   Body mass index is 28.41 kg/m.  Physical Exam  GENERAL APPEARANCE: Well nourished. In no acute distress.  SKIN:  Skin is warm and dry.  MOUTH and THROAT: Lips are without lesions. Oral mucosa is moist and without lesions.  RESPIRATORY: Breathing is even & unlabored, BS CTAB CARDIAC: RRR, no murmur,no extra heart sounds, Left foot 2+ edema GI: Abdomen is enlarged, normal BS EXTREMITIES: Able to move X 4 extremities NEUROLOGICAL: There is no tremor. Speech is clear.Alert and oriented X 3.  PSYCHIATRIC:  Affect and behavior are appropriate  Labs reviewed: Recent Labs    01/04/19 0524 01/05/19 0337 01/18/19 01/25/19 1354  NA 136 135 141 138  K 4.8 4.5 4.0 4.2  CL 105 105  --  103  CO2 22 23  --  28  GLUCOSE 154* 168*  --  97  BUN 26* 28* 17 20  CREATININE 1.13* 1.02* 0.9 1.00  CALCIUM 8.6* 8.1*  --  9.1   Recent Labs    12/27/18 1108 12/31/18 0541 01/25/19 1354  AST 32 35 34  ALT 21 26 44  ALKPHOS 84 56 182*  BILITOT 0.9 0.6 <0.2*  PROT 9.2* 5.0* 6.1*  ALBUMIN 3.6 2.2* 2.6*   Recent Labs    12/31/18 0541  01/04/19 0524 01/05/19 0337 01/18/19 01/25/19 1354  WBC 9.2   < > 9.9 19.8* 1.9 7.7  NEUTROABS 6.6  --   --   --  0 5.5   HGB 10.4*   < > 11.3* 10.6* 9.9* 10.7*  HCT 32.5*   < > 35.0* 32.2* 29* 32.5*  MCV 91.3   < > 90.9 89.2  --  89.5  PLT 238   < > 195 242 179 223   < > = values in this interval not displayed.   Lab Results  Component Value Date   TSH 1.162 12/28/2018   Significant Diagnostic Results in last 30 days:  Ir Ivc Filter Plmt / S&i /img Guid/mod Sed  Result Date: 12/31/2018 INDICATION: 73 year old with a large central abdominal and pelvic mass. EXAM: IVC FILTER PLACEMENT; IVC VENOGRAM; ULTRASOUND FOR VASCULAR ACCESS Physician: Stephan Minister. Anselm Pancoast, MD MEDICATIONS: None. ANESTHESIA/SEDATION: Fentanyl 100 mcg  IV; Versed 2.0 mg IV Moderate Sedation Time:  26 minutes minutes The patient was continuously monitored during the procedure by the interventional radiology nurse under my direct supervision. CONTRAST:  Carbon dioxide FLUOROSCOPY TIME:  Fluoroscopy Time: 4 minutes 48 seconds (437 mGy). COMPLICATIONS: None immediate. PROCEDURE: The procedure was explained to the patient. The risks and benefits of the procedure were discussed and the patient's questions were addressed. Informed consent was obtained from the patient. Ultrasound demonstrated a patent right internal jugular vein. Ultrasound images were obtained for documentation. The right side of the neck was prepped and draped in a sterile fashion. Maximal barrier sterile technique was utilized including caps, mask, sterile gowns, sterile gloves, sterile drape, hand hygiene and skin antiseptic. The skin was anesthetized with 1% lidocaine. A 21 gauge needle was directed into the vein with ultrasound guidance and a micropuncture dilator set was placed. A wire was advanced into the IVC. The filter sheath was advanced over the wire into the IVC. An IVC venogram was performed with carbon dioxide due to renal insufficiency. Fluoroscopic images were obtained for documentation. Renal veins were not confidently identified with carbon dioxide. Therefore, 5 French catheter and  Bentson wire were used to cannulate the bilateral renal veins. A Bard Denali filter was deployed below the lowest renal vein. Left renal vein was again cannulated with a catheter and wire to ensure placement below the renal vein. Final spot radiograph images were obtained. Vascular sheath was removed with manual compression. Bandage placed over the puncture site. FINDINGS: IVC is small but patent. Bilateral renal veins were identified by cannulating the veins with a catheter and wire. The filter was deployed below the lowest renal vein. IMPRESSION: Successful placement of a retrievable IVC filter. PLAN: This IVC filter is potentially retrievable. The patient will be assessed for filter retrieval by Interventional Radiology in approximately 8-12 weeks. Further recommendations regarding filter retrieval, continued surveillance or declaration of device permanence, will be made at that time. Electronically Signed   By: Markus Daft M.D.   On: 12/31/2018 13:28   US Paracentesis  Result Date: 01/08/2019 INDICATION: Patient with history of advanced ovarian cancer with malignant ascites. Request for therapeutic paracentesis today. EXAM: ULTRASOUND GUIDED THERAPEUTIC PARACENTESIS MEDICATIONS: 10 mL 1% lidocaine COMPLICATIONS: None immediate. PROCEDURE: Informed written consent was obtained from the patient after a discussion of the risks, benefits and alternatives to treatment. A timeout was performed prior to the initiation of the procedure. Initial ultrasound scanning demonstrates a moderate amount of ascites within the left lower abdominal quadrant. The left lower abdomen was prepped and draped in the usual sterile fashion. 1% lidocaine was used for local anesthesia. Following this, a 19 gauge, 7-cm, Yueh catheter was introduced. An ultrasound image was saved for documentation purposes. The paracentesis was performed. The catheter was removed and a dressing was applied. The patient tolerated the procedure well without  immediate post procedural complication. FINDINGS: A total of approximately 3.3 L of hazy yellow fluid was removed. IMPRESSION: Successful ultrasound-guided paracentesis yielding 3.3 liters of peritoneal fluid. Read by Cyndee Brightly Electronically Signed   By: Jerilynn Mages.  Shick M.D.   On: 01/08/2019 14:31   US Paracentesis  Result Date: 01/01/2019 INDICATION: Patient with history of metastatic adenocarcinoma, abdominopelvic mass, recurrent malignant ascites. Request made for therapeutic paracentesis. EXAM: ULTRASOUND GUIDED THERAPEUTIC PARACENTESIS MEDICATIONS: None COMPLICATIONS: None immediate. PROCEDURE: Informed written consent was obtained from the patient after a discussion of the risks, benefits and alternatives to treatment. A timeout was performed prior to the  initiation of the procedure. Initial ultrasound scanning demonstrates a large amount of ascites within the left lower abdominal quadrant. The left lower abdomen was prepped and draped in the usual sterile fashion. 1% lidocaine was used for local anesthesia. Following this, a 19 gauge, 10-cm, Yueh catheter was introduced. An ultrasound image was saved for documentation purposes. The paracentesis was performed. The catheter was removed and a dressing was applied. The patient tolerated the procedure well without immediate post procedural complication. FINDINGS: A total of approximately 9.3 liters of slightly hazy, yellow fluid was removed. IMPRESSION: Successful ultrasound-guided therapeutic paracentesis yielding 9.3 liters of peritoneal fluid. Read by: Rowe Robert, PA-C Electronically Signed   By: Markus Daft M.D.   On: 12/31/2018 17:38   Ir Paracentesis  Result Date: 01/27/2019 INDICATION: Favoring cancer with malignant ascites. Request for therapeutic paracentesis. EXAM: ULTRASOUND GUIDED PARACENTESIS MEDICATIONS: 1% lidocaine 10 mL COMPLICATIONS: None immediate. PROCEDURE: Informed written consent was obtained from the patient after a  discussion of the risks, benefits and alternatives to treatment. A timeout was performed prior to the initiation of the procedure. Initial ultrasound scanning demonstrates a moderate amount of ascites within the left lower abdominal quadrant. The left lower abdomen was prepped and draped in the usual sterile fashion. 1% lidocaine with epinephrine was used for local anesthesia. Following this, a 19 gauge, 10-cm, Yueh catheter was introduced. An ultrasound image was saved for documentation purposes. The paracentesis was performed. The catheter was removed and a dressing was applied. The patient tolerated the procedure well without immediate post procedural complication. FINDINGS: A total of approximately 3.7 L of clear yellow fluid was removed. IMPRESSION: Successful ultrasound-guided paracentesis yielding 3 7 liters of peritoneal fluid. Read by: Gareth Eagle PA-C Electronically Signed   By: Corrie Mckusick D.O.   On: 01/27/2019 11:22    Assessment/Plan   1. Acute deep vein thrombosis (DVT) of femoral vein of left lower extremity (HCC) -S/P  Lovenox 80 mg subcu every 12 hours  2. 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 Slow transit constipation -Complained of constipation, will start on senna S8 0.6-50 mg 2 tabs at bedtime  5.  Exposure to flu  -we will continue Tamiflu    Family/ staff Communication: Discussed plan of care with resident.  Labs/tests ordered: None  Goals of care:   Short-term rehabilitation.   Durenda Age, NP [facility] [phone] (Monday-Friday 8:00 a.m. - 5:00 p.m.) [phone] (after hours)

## 2019-02-05 ENCOUNTER — Non-Acute Institutional Stay (SKILLED_NURSING_FACILITY): Payer: Self-pay | Admitting: Adult Health

## 2019-02-05 ENCOUNTER — Encounter: Payer: Self-pay | Admitting: Adult Health

## 2019-02-05 DIAGNOSIS — D630 Anemia in neoplastic disease: Secondary | ICD-10-CM

## 2019-02-05 DIAGNOSIS — C569 Malignant neoplasm of unspecified ovary: Secondary | ICD-10-CM

## 2019-02-05 DIAGNOSIS — I82412 Acute embolism and thrombosis of left femoral vein: Secondary | ICD-10-CM

## 2019-02-05 DIAGNOSIS — K5901 Slow transit constipation: Secondary | ICD-10-CM

## 2019-02-05 NOTE — Progress Notes (Signed)
Location:  [facility] Room Number: 213-A Place of Service:  SNF (31) Provider:  Durenda Age, NP  Patient Care Team: Patient, No Pcp Per as PCP - General (General Practice)  Extended Emergency Contact Information Primary Emergency Contact: Kook,THOMAS Address: [place],  [place] Home Phone: [phone] Relation: None  Code Status:  Full Code  Goals of care: Advanced Directive information Advanced Directives 01/01/2019  Does Patient Have a Medical Advance Directive? No  Would patient like information on creating a medical advance directive? No - Patient declined     Chief Complaint  Patient presents with  . Medical Management of Chronic Issues    Routine [facility] visit    HPI:  Pt is a 73 y.o. female seen today for medical management of chronic diseases.  She is a short-term rehabilitation resident of [facility].  She has a PMH of AKI, DVT, and ovarian cancer. She was seen in the room today. She denies having any pain. She recently finished Tamiflu treatment for flu prophylaxis.   She has been admitted to [facility] rehabilitation on 01/08/2019 following an admission at [facility] 12/27/2018 to 01/08/2019 for DVT, AKI and malignant neoplasm of the ovary.  IVC filter placed by interventional radiology on 12/31/2018.  She was started on Lovenox with no stop date at this time and will follow-up with Dr. Alvy Bimler, hematology.  She had paracentesis done on 12/30 and removed 5 L and another 9.3 L on 12/31/2018.  She follows up with Dr. Harrington Challenger, [place].   Past Medical History:  Diagnosis Date  . AKI (acute kidney injury) ([place]) 12/2018  . DVT (deep venous thrombosis) ([place]) 12/2018  . Ovarian cancer [facility])    Past Surgical History:  Procedure Laterality Date  . CESAREAN SECTION    . Cyst removal on Left Wrist Left   . IR IVC FILTER PLMT / S&I /IMG GUID/MOD SED  12/31/2018  . IR PARACENTESIS  01/27/2019    . Surgery on Right Wrist Right 2006  . TONSILLECTOMY      Allergies  Allergen Reactions  . Penicillins Rash    DID THE REACTION INVOLVE: Swelling of the face/tongue/throat, SOB, or low BP? No Sudden or severe rash/hives, skin peeling, or the inside of the mouth or nose? No Did it require medical treatment? No When did it last happen?childhood allergy If all above answers are "NO", may proceed with cephalosporin use.     Outpatient Encounter Medications as of 02/05/2019  Medication Sig  . bismuth subsalicylate (PEPTO BISMOL) 262 MG/15ML suspension Take 30 mLs by mouth every 4 (four) hours as needed for indigestion.  Marland Kitchen dexamethasone (DECADRON) 4 MG tablet Take 3 tabs at the night before and 3 tab the morning of chemotherapy, every 3 weeks, by mouth  . enoxaparin (LOVENOX) 80 MG/0.8ML injection Inject 0.8 mLs (80 mg total) into the skin every 12 (twelve) hours.  . lidocaine-prilocaine (EMLA) cream Apply 1 application topically as needed.  . Multiple Vitamins-Minerals (MULTIVITAMIN WITH MINERALS) tablet Take 1 tablet by mouth daily.  . Nutritional Supplements (NUTRITIONAL SUPPLEMENT PO) Take 1 each by mouth 2 (two) times daily. Magic Cup  . ondansetron (ZOFRAN) 8 MG tablet Take 1 tablet (8 mg total) by mouth every 8 (eight) hours as needed for nausea.  . prochlorperazine (COMPAZINE) 10 MG tablet Take 1 tablet (10 mg total) by mouth every 6 (six) hours as needed for nausea or  vomiting.  . senna (SENOKOT) 8.6 MG TABS tablet Take 2 tablets by mouth at bedtime.  . [DISCONTINUED] oseltamivir (TAMIFLU) 75 MG capsule Take 75 mg by mouth daily.   No facility-administered encounter medications on file as of 02/05/2019.     Review of Systems  GENERAL: No change in appetite, no fatigue, no weight changes MOUTH and THROAT: Denies oral discomfort, gingival pain or bleeding, pain from teeth or hoarseness   RESPIRATORY: no cough, SOB, DOE, wheezing, hemoptysis CARDIAC: No chest pain, or  palpitations GI: No abdominal pain, diarrhea, constipation, heart burn GU: Denies dysuria, frequency, hematuria, incontinence, or discharge PSYCHIATRIC: Denies feelings of depression or anxiety. No report of hallucinations, insomnia, paranoia, or agitation    Immunization History  Administered Date(s) Administered  . Influenza-Unspecified 09/29/2018  . Pneumococcal Polysaccharide-23 01/07/2019   Pertinent  Health Maintenance Due  Topic Date Due  . MAMMOGRAM  07/21/1995  . COLONOSCOPY  07/21/1995  . DEXA SCAN  07/20/2010  . PNA vac Low Risk Adult (2 of 2 - PCV13) 01/08/2020  . INFLUENZA VACCINE  Completed    Vitals:   02/05/19 0956  BP: 125/68  Pulse: 92  Resp: 18  Temp: 98.9 F (37.2 C)  TempSrc: Oral  Weight: 174 lb (78.9 kg)  Height: 5\' 7"  (1.702 m)   Body mass index is 27.25 kg/m.  Physical Exam  GENERAL APPEARANCE: Well nourished. In no acute distress. Normal body habitus SKIN:  Skin is warm and dry.  MOUTH and THROAT: Lips are without lesions. Oral mucosa is moist and without lesions. Tongue is normal in shape, size, and color and without lesions RESPIRATORY: Breathing is even & unlabored, BS CTAB CARDIAC: RRR, no murmur,no extra heart sounds, Left foot 2+ edema GI: Abdomen soft, normal BS, no masses, no tenderness EXTREMITIES:  Able to move X 4 extremities NEUROLOGICAL: There is no tremor. Speech is clear. Alert and oriented X 3.  PSYCHIATRIC: Affect and behavior are appropriate    Labs reviewed: Recent Labs    01/04/19 0524 01/05/19 0337 01/18/19 01/25/19 1354  NA 136 135 141 138  K 4.8 4.5 4.0 4.2  CL 105 105  --  103  CO2 22 23  --  28  GLUCOSE 154* 168*  --  97  BUN 26* 28* 17 20  CREATININE 1.13* 1.02* 0.9 1.00  CALCIUM 8.6* 8.1*  --  9.1   Recent Labs    12/27/18 1108 12/31/18 0541 01/25/19 1354  AST 32 35 34  ALT 21 26 44  ALKPHOS 84 56 182*  BILITOT 0.9 0.6 <0.2*  PROT 9.2* 5.0* 6.1*  ALBUMIN 3.6 2.2* 2.6*   Recent Labs     12/31/18 0541  01/04/19 0524 01/05/19 0337 01/18/19 01/25/19 1354  WBC 9.2   < > 9.9 19.8* 1.9 7.7  NEUTROABS 6.6  --   --   --  0 5.5  HGB 10.4*   < > 11.3* 10.6* 9.9* 10.7*  HCT 32.5*   < > 35.0* 32.2* 29* 32.5*  MCV 91.3   < > 90.9 89.2  --  89.5  PLT 238   < > 195 242 179 223   < > = values in this interval not displayed.   Lab Results  Component Value Date   TSH 1.162 12/28/2018    Significant Diagnostic Results in last 30 days:  US Paracentesis  Result Date: 01/08/2019 INDICATION: Patient with history of advanced ovarian cancer with malignant ascites. Request for therapeutic paracentesis today. EXAM: ULTRASOUND GUIDED  THERAPEUTIC PARACENTESIS MEDICATIONS: 10 mL 1% lidocaine COMPLICATIONS: None immediate. PROCEDURE: Informed written consent was obtained from the patient after a discussion of the risks, benefits and alternatives to treatment. A timeout was performed prior to the initiation of the procedure. Initial ultrasound scanning demonstrates a moderate amount of ascites within the left lower abdominal quadrant. The left lower abdomen was prepped and draped in the usual sterile fashion. 1% lidocaine was used for local anesthesia. Following this, a 19 gauge, 7-cm, Yueh catheter was introduced. An ultrasound image was saved for documentation purposes. The paracentesis was performed. The catheter was removed and a dressing was applied. The patient tolerated the procedure well without immediate post procedural complication. FINDINGS: A total of approximately 3.3 L of hazy yellow fluid was removed. IMPRESSION: Successful ultrasound-guided paracentesis yielding 3.3 liters of peritoneal fluid. Read by Cyndee Brightly Electronically Signed   By: Jerilynn Mages.  Shick M.D.   On: 01/08/2019 14:31   Ir Paracentesis  Result Date: 01/27/2019 INDICATION: Favoring cancer with malignant ascites. Request for therapeutic paracentesis. EXAM: ULTRASOUND GUIDED PARACENTESIS MEDICATIONS: 1% lidocaine 10 mL  COMPLICATIONS: None immediate. PROCEDURE: Informed written consent was obtained from the patient after a discussion of the risks, benefits and alternatives to treatment. A timeout was performed prior to the initiation of the procedure. Initial ultrasound scanning demonstrates a moderate amount of ascites within the left lower abdominal quadrant. The left lower abdomen was prepped and draped in the usual sterile fashion. 1% lidocaine with epinephrine was used for local anesthesia. Following this, a 19 gauge, 10-cm, Yueh catheter was introduced. An ultrasound image was saved for documentation purposes. The paracentesis was performed. The catheter was removed and a dressing was applied. The patient tolerated the procedure well without immediate post procedural complication. FINDINGS: A total of approximately 3.7 L of clear yellow fluid was removed. IMPRESSION: Successful ultrasound-guided paracentesis yielding 3 7 liters of peritoneal fluid. Read by: Gareth Eagle PA-C Electronically Signed   By: Corrie Mckusick D.O.   On: 01/27/2019 11:22    Assessment/Plan  1. Malignant neoplasm of ovary, unspecified laterality ([place]) - had recently finished 2nd infusion, follows up with hematology/oncology Dr. 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 Acute deep vein thrombosis (DVT) of femoral vein of left lower extremity (HCC) -Continue Lovenox 80 mg subcu every 12 hours  4. Slow transit constipation -Verbalized moving her bowels regularly, continue senna 8.6 mg 2 tabs nightly    Family/ staff Communication:  Discussed plan of care with patient.  Labs/tests ordered:  None  Goals of care:   Short-term rehabilitation.   Durenda Age, NP [facility] [phone] (Monday-Friday 8:00 a.m. - 5:00 p.m.) [phone] (after hours)

## 2019-02-18 ENCOUNTER — Inpatient Hospital Stay (HOSPITAL_BASED_OUTPATIENT_CLINIC_OR_DEPARTMENT_OTHER): Payer: Self-pay | Admitting: Hematology and Oncology

## 2019-02-18 ENCOUNTER — Telehealth: Payer: Self-pay | Admitting: Oncology

## 2019-02-18 ENCOUNTER — Telehealth: Payer: Self-pay | Admitting: Hematology and Oncology

## 2019-02-18 ENCOUNTER — Inpatient Hospital Stay: Payer: Self-pay | Attending: Hematology and Oncology

## 2019-02-18 ENCOUNTER — Other Ambulatory Visit: Payer: Self-pay | Admitting: Hematology and Oncology

## 2019-02-18 ENCOUNTER — Encounter: Payer: Self-pay | Admitting: Hematology and Oncology

## 2019-02-18 ENCOUNTER — Inpatient Hospital Stay: Payer: Self-pay

## 2019-02-18 VITALS — BP 134/57 | HR 100 | Temp 97.5°F | Resp 18 | Ht 67.0 in | Wt 175.6 lb

## 2019-02-18 DIAGNOSIS — R180 Malignant ascites: Secondary | ICD-10-CM

## 2019-02-18 DIAGNOSIS — C569 Malignant neoplasm of unspecified ovary: Secondary | ICD-10-CM | POA: Insufficient documentation

## 2019-02-18 DIAGNOSIS — D630 Anemia in neoplastic disease: Secondary | ICD-10-CM | POA: Insufficient documentation

## 2019-02-18 DIAGNOSIS — Z5111 Encounter for antineoplastic chemotherapy: Secondary | ICD-10-CM | POA: Insufficient documentation

## 2019-02-18 DIAGNOSIS — R601 Generalized edema: Secondary | ICD-10-CM

## 2019-02-18 DIAGNOSIS — I82412 Acute embolism and thrombosis of left femoral vein: Secondary | ICD-10-CM

## 2019-02-18 DIAGNOSIS — R918 Other nonspecific abnormal finding of lung field: Secondary | ICD-10-CM

## 2019-02-18 LAB — COMPREHENSIVE METABOLIC PANEL WITH GFR
ALT: 33 U/L (ref 0–44)
AST: 27 U/L (ref 15–41)
Albumin: 2.9 g/dL — ABNORMAL LOW (ref 3.5–5.0)
Alkaline Phosphatase: 141 U/L — ABNORMAL HIGH (ref 38–126)
Anion gap: 11 (ref 5–15)
BUN: 24 mg/dL — ABNORMAL HIGH (ref 8–23)
CO2: 23 mmol/L (ref 22–32)
Calcium: 9.4 mg/dL (ref 8.9–10.3)
Chloride: 105 mmol/L (ref 98–111)
Creatinine, Ser: 1.08 mg/dL — ABNORMAL HIGH (ref 0.44–1.00)
GFR calc Af Amer: 59 mL/min — ABNORMAL LOW (ref >60)
GFR calc non Af Amer: 51 mL/min — ABNORMAL LOW (ref >60)
Glucose, Bld: 155 mg/dL — ABNORMAL HIGH (ref 70–99)
Potassium: 4 mmol/L (ref 3.5–5.1)
Sodium: 139 mmol/L (ref 135–145)
Total Bilirubin: 0.3 mg/dL (ref 0.3–1.2)
Total Protein: 6.5 g/dL (ref 6.5–8.1)

## 2019-02-18 LAB — CBC WITH DIFFERENTIAL/PLATELET
Abs Immature Granulocytes: 0.08 10*3/uL — ABNORMAL HIGH (ref 0.00–0.07)
Basophils Absolute: 0 10*3/uL (ref 0.0–0.1)
Basophils Relative: 0 %
Eosinophils Absolute: 0 10*3/uL (ref 0.0–0.5)
Eosinophils Relative: 0 %
HCT: 32.6 % — ABNORMAL LOW (ref 36.0–46.0)
Hemoglobin: 10.6 g/dL — ABNORMAL LOW (ref 12.0–15.0)
Immature Granulocytes: 1 %
LYMPHS ABS: 0.6 10*3/uL — AB (ref 0.7–4.0)
LYMPHS PCT: 6 %
MCH: 29.4 pg (ref 26.0–34.0)
MCHC: 32.5 g/dL (ref 30.0–36.0)
MCV: 90.6 fL (ref 80.0–100.0)
Monocytes Absolute: 0.5 10*3/uL (ref 0.1–1.0)
Monocytes Relative: 5 %
NEUTROS ABS: 8.7 10*3/uL — AB (ref 1.7–7.7)
Neutrophils Relative %: 88 %
PLATELETS: 143 10*3/uL — AB (ref 150–400)
RBC: 3.6 MIL/uL — ABNORMAL LOW (ref 3.87–5.11)
RDW: 14.9 % (ref 11.5–15.5)
WBC: 10 10*3/uL (ref 4.0–10.5)
nRBC: 0 % (ref 0.0–0.2)

## 2019-02-18 MED ORDER — SODIUM CHLORIDE 0.9 % IV SOLN
480.0000 mg | Freq: Once | INTRAVENOUS | Status: AC
  Administered 2019-02-18: 480 mg via INTRAVENOUS
  Filled 2019-02-18: qty 48

## 2019-02-18 MED ORDER — PALONOSETRON HCL INJECTION 0.25 MG/5ML
INTRAVENOUS | Status: AC
  Filled 2019-02-18: qty 5

## 2019-02-18 MED ORDER — DIPHENHYDRAMINE HCL 50 MG/ML IJ SOLN
INTRAMUSCULAR | Status: AC
  Filled 2019-02-18: qty 1

## 2019-02-18 MED ORDER — FAMOTIDINE IN NACL 20-0.9 MG/50ML-% IV SOLN
INTRAVENOUS | Status: AC
  Filled 2019-02-18: qty 50

## 2019-02-18 MED ORDER — FAMOTIDINE IN NACL 20-0.9 MG/50ML-% IV SOLN
20.0000 mg | Freq: Once | INTRAVENOUS | Status: AC
  Administered 2019-02-18: 20 mg via INTRAVENOUS

## 2019-02-18 MED ORDER — DIPHENHYDRAMINE HCL 50 MG/ML IJ SOLN
50.0000 mg | Freq: Once | INTRAMUSCULAR | Status: AC
  Administered 2019-02-18: 50 mg via INTRAVENOUS

## 2019-02-18 MED ORDER — PALONOSETRON HCL INJECTION 0.25 MG/5ML
0.2500 mg | Freq: Once | INTRAVENOUS | Status: AC
  Administered 2019-02-18: 0.25 mg via INTRAVENOUS

## 2019-02-18 MED ORDER — SODIUM CHLORIDE 0.9 % IV SOLN
Freq: Once | INTRAVENOUS | Status: AC
  Administered 2019-02-18: 12:00:00 via INTRAVENOUS
  Filled 2019-02-18: qty 250

## 2019-02-18 MED ORDER — SODIUM CHLORIDE 0.9 % IV SOLN
Freq: Once | INTRAVENOUS | Status: AC
  Administered 2019-02-18: 12:00:00 via INTRAVENOUS
  Filled 2019-02-18: qty 5

## 2019-02-18 MED ORDER — SODIUM CHLORIDE 0.9 % IV SOLN
175.0000 mg/m2 | Freq: Once | INTRAVENOUS | Status: AC
  Administered 2019-02-18: 342 mg via INTRAVENOUS
  Filled 2019-02-18: qty 57

## 2019-02-18 NOTE — Assessment & Plan Note (Signed)
She has obvious recurrent ascites I will order therapeutic paracentesis We will schedule CT in 3 weeks for objective assessment of response to treatment

## 2019-02-18 NOTE — Progress Notes (Signed)
Also asked patient if she has ever applied for Medicaid due to her having Medicare Part A only. Patient states she has funds that exceed their allowed amount in savings and does not qualify. Advised patient she would have to apply and be denied for Medicaid in order to apply for an additional discount through the hospital. She will automatically receive a 55% discount for being uninsured. Asked patient if she has applied for additional services through Medicare such as [employer] and she states is over the reserve amount for them as well. Patient wanted to know about a payment plan. Advised her once she receives the bill, she may call patient accounting to set this up. Advised about [employer] and gave brochure. She verbalized understanding.  She has my card for any additional financial questions or concerns.

## 2019-02-18 NOTE — Assessment & Plan Note (Signed)
She has diffuse anasarca So far, her leg swelling is improving and serum albumin has improved

## 2019-02-18 NOTE — Progress Notes (Signed)
[place] OFFICE PROGRESS NOTE  Patient Care Team: Patient, No Pcp Per as PCP - General (General Practice)  ASSESSMENT & PLAN:  Malignant neoplasm of ovary ([place]) So far, she tolerated chemotherapy well.  The only complication I can see so far is just hair loss She has recurrent ascites We will proceed with chemotherapy today and I will schedule therapeutic paracentesis next week I plan to repeat CT imaging before I see her again in 3 weeks for objective assessment of response to therapy  Malignant ascites She has obvious recurrent ascites I will order therapeutic paracentesis We will schedule CT in 3 weeks for objective assessment of response to treatment  Acute DVT (deep venous thrombosis) (HCC) Her leg swelling has improved She will continue Lovenox for now  Anemia in neoplastic disease This is likely anemia of chronic disease. The patient denies recent history of bleeding such as epistaxis, hematuria or hematochezia. She is asymptomatic from the anemia. We will observe for now.   Anasarca She has diffuse anasarca So far, her leg swelling is improving and serum albumin has improved  Pulmonary nodules/lesions, multiple Her initial CT scan showed pulmonary nodules I will order a CT scan of the chest in 3 weeks for assessment.   Orders Placed This Encounter  Procedures  . US Paracentesis    Standing Status:   Future    Standing Expiration Date:   02/18/2020    Order Specific Question:   If therapeutic, is there a maximum amount of fluid to be removed?    Answer:   Yes    Order Specific Question:   What is the maximum amount of fluide to be removed?    Answer:   5 liter    Order Specific Question:   Are labs required for specimen collection?    Answer:   No    Order Specific Question:   Is Albumin medication needed?    Answer:   No    Order Specific Question:   Reason for Exam (SYMPTOM  OR DIAGNOSIS REQUIRED)    Answer:   malignant ascites    Order  Specific Question:   Preferred imaging location?    Answer:   [facility]  . CT ABDOMEN PELVIS W CONTRAST    Standing Status:   Future    Standing Expiration Date:   02/19/2020    Order Specific Question:   If indicated for the ordered procedure, I authorize the administration of contrast media per Radiology protocol    Answer:   Yes    Order Specific Question:   Preferred imaging location?    Answer:   [facility]    Order Specific Question:   Radiology Contrast Protocol - do NOT remove file path    Answer:   \\[email or web address]  . CT CHEST W CONTRAST    Standing Status:   Future    Standing Expiration Date:   02/19/2020    Order Specific Question:   If indicated for the ordered procedure, I authorize the administration of contrast media per Radiology protocol    Answer:   Yes    Order Specific Question:   Preferred imaging location?    Answer:   [facility]    Order Specific Question:   Radiology Contrast Protocol - do NOT remove file path    Answer:   \\[email or web address]    INTERVAL HISTORY: Please see below for problem oriented charting. She returns for further follow-up She tolerated chemotherapy well Denies  nausea or vomiting She has mild constipation, resolved with laxative No peripheral neuropathy No recent infection, fever or chills No cough or shortness of breath She found that her leg swelling is improving The patient denies any recent signs or symptoms of bleeding such as spontaneous epistaxis, hematuria or hematochezia.   SUMMARY OF ONCOLOGIC HISTORY:   Malignant neoplasm of ovary ([place])   12/27/2018 Imaging    Venous Doppler US lower extremity: Right: No evidence of common femoral vein obstruction. Left: Findings consistent with acute deep vein thrombosis involving the left common femoral vein, and left femoral vein. Findings consistent with acute superficial vein thrombosis involving the  left great saphenous vein. Findings consistent with age indeterminate deep vein thrombosis involving the left proximal profunda vein, left popliteal vein, left posterior tibial vein, and left peroneal vein. Left external iliac vein appears thrombosied. IVC was not viualized. No cystic structure found in the popliteal fossa.    12/27/2018 Imaging    Ct scan of chest, abdomen and pelvis showed: 1. 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 This is associated with a very large volume of (presumably malignant) ascites. 2. Multiple tiny 1-3 mm pulmonary nodules scattered throughout the lungs bilaterally. These are nonspecific, but favored to represent benign areas of mucoid impaction within terminal bronchioles. However, close attention on follow-up studies is recommended to ensure the stability or resolution of this finding, as the possibility of metastatic disease is not excluded. 3. Aortic atherosclerosis, in addition to left anterior descending coronary artery disease. Assessment for potential risk factor modification, dietary therapy or pharmacologic therapy may be warranted, if clinically indicated.     12/27/2018 Tumor Marker    Patient's tumor was tested for the following markers: CA-125. Results of the tumor marker test revealed 40.2.    12/27/2018 - 01/06/2019 Hospital Admission    She was admitted to the hospital and was diagnosed with DVT and ovarian cancer    12/28/2018 Pathology Results    PERITONEAL/ASCITIC FLUID (SPECIMEN 1 OF 1 COLLECTED 12/28/18): NON-SMALL CELL CARCINOMA, FAVOR ADENOCARCINOMA.    12/28/2018 Procedure    Successful ultrasound-guided diagnostic and therapeutic paracentesis yielding 5 liters of peritoneal fluid.    12/30/2018 Echocardiogram    Left ventricle: The cavity size was normal. Wall thickness was normal. Systolic function was normal. The estimated ejection  fraction was in the range of 60% to 65%. Wall motion was normal; there were no regional wall motion abnormalities. Doppler parameters are consistent with abnormal left ventricular relaxation (grade 1 diastolic dysfunction).    12/31/2018 Procedure    Successful ultrasound-guided therapeutic paracentesis yielding 9.3 liters of peritoneal fluid.     12/31/2018 Procedure    This IVC filter is potentially retrievable. The patient will be assessed for filter retrieval by Interventional Radiology in approximately 8-12 weeks. Further recommendations regarding filter retrieval, continued surveillance or declaration of device permanence, will be made at that time.     01/04/2019 -  Chemotherapy    The patient had carboplatin and taxol for chemotherapy treatment.     01/25/2019 Cancer Staging    Staging form: Ovary, Fallopian Tube, and Primary Peritoneal Carcinoma, AJCC 8th Edition - Clinical: cT3, cN0, cM0 - Signed by Heath Lark, MD on 01/25/2019    01/25/2019 Tumor Marker    Patient's tumor was tested for the following markers: CA-125. Results of the tumor marker test revealed 24.9    01/27/2019 Procedure  Successful ultrasound-guided paracentesis yielding 3 7 liters of peritoneal fluid.      REVIEW OF SYSTEMS:   Constitutional: Denies fevers, chills or abnormal weight loss Eyes: Denies blurriness of vision Ears, nose, mouth, throat, and face: Denies mucositis or sore throat Respiratory: Denies cough, dyspnea or wheezes Cardiovascular: Denies palpitation, chest discomfort or lower extremity swelling Gastrointestinal:  Denies nausea, heartburn or change in bowel habits Skin: Denies abnormal skin rashes Lymphatics: Denies new lymphadenopathy or easy bruising Neurological:Denies numbness, tingling or new weaknesses Behavioral/Psych: Mood is stable, no new changes  All other systems were reviewed with the patient and are negative.  I have reviewed the past medical history, past surgical  history, social history and family history with the patient and they are unchanged from previous note.  ALLERGIES:  is allergic to penicillins.  MEDICATIONS:  Current Outpatient Medications  Medication Sig Dispense Refill  . bismuth subsalicylate (PEPTO BISMOL) 262 MG/15ML suspension Take 30 mLs by mouth every 4 (four) hours as needed for indigestion.    Marland Kitchen dexamethasone (DECADRON) 4 MG tablet Take 3 tabs at the night before and 3 tab the morning of chemotherapy, every 3 weeks, by mouth 60 tablet 0  . enoxaparin (LOVENOX) 80 MG/0.8ML injection Inject 0.8 mLs (80 mg total) into the skin every 12 (twelve) hours. 22.4 Syringe 1  . lidocaine-prilocaine (EMLA) cream Apply 1 application topically as needed. 30 g 6  . Multiple Vitamins-Minerals (MULTIVITAMIN WITH MINERALS) tablet Take 1 tablet by mouth daily.    . Nutritional Supplements (NUTRITIONAL SUPPLEMENT PO) Take 1 each by mouth 2 (two) times daily. Magic Cup    . ondansetron (ZOFRAN) 8 MG tablet Take 1 tablet (8 mg total) by mouth every 8 (eight) hours as needed for nausea. 30 tablet 3  . prochlorperazine (COMPAZINE) 10 MG tablet Take 1 tablet (10 mg total) by mouth every 6 (six) hours as needed for nausea or vomiting. 30 tablet 1  . senna (SENOKOT) 8.6 MG TABS tablet Take 2 tablets by mouth at bedtime.     No current facility-administered medications for this visit.     PHYSICAL EXAMINATION: ECOG PERFORMANCE STATUS: 2 - Symptomatic, <50% confined to bed  Vitals:   02/18/19 1053  BP: (!) 134/57  Pulse: 100  Resp: 18  Temp: (!) 97.5 F (36.4 C)  SpO2: 98%   Filed Weights   02/18/19 1053  Weight: 175 lb 9.6 oz (79.7 kg)    GENERAL:alert, no distress and comfortable SKIN: skin color, texture, turgor are normal, no rashes or significant lesions EYES: normal, Conjunctiva are pink and non-injected, sclera clear OROPHARYNX:no exudate, no erythema and lips, buccal mucosa, and tongue normal  NECK: supple, thyroid normal size,  non-tender, without nodularity LYMPH:  no palpable lymphadenopathy in the cervical, axillary or inguinal LUNGS: clear to auscultation and percussion with normal breathing effort HEART: regular rate & rhythm and no murmurs with moderate bilateral lower extremity edema ABDOMEN:abdomen soft, non-tender and normal bowel sounds.  Diffuse abdominal distention consistent with ascites Musculoskeletal:no cyanosis of digits and no clubbing  NEURO: alert & oriented x 3 with fluent speech, no focal motor/sensory deficits  LABORATORY DATA:  I have reviewed the data as listed    Component Value Date/Time   NA 139 02/18/2019 1012   NA 141 01/18/2019   K 4.0 02/18/2019 1012   CL 105 02/18/2019 1012   CO2 23 02/18/2019 1012   GLUCOSE 155 (H) 02/18/2019 1012   BUN 24 (H) 02/18/2019 1012   BUN 17 01/18/2019  CREATININE 1.08 (H) 02/18/2019 1012   CALCIUM 9.4 02/18/2019 1012   PROT 6.5 02/18/2019 1012   ALBUMIN 2.9 (L) 02/18/2019 1012   AST 27 02/18/2019 1012   ALT 33 02/18/2019 1012   ALKPHOS 141 (H) 02/18/2019 1012   BILITOT 0.3 02/18/2019 1012   GFRNONAA 51 (L) 02/18/2019 1012   GFRAA 59 (L) 02/18/2019 1012    No results found for: SPEP, UPEP  Lab Results  Component Value Date   WBC 10.0 02/18/2019   NEUTROABS 8.7 (H) 02/18/2019   HGB 10.6 (L) 02/18/2019   HCT 32.6 (L) 02/18/2019   MCV 90.6 02/18/2019   PLT 143 (L) 02/18/2019      Chemistry      Component Value Date/Time   NA 139 02/18/2019 1012   NA 141 01/18/2019   K 4.0 02/18/2019 1012   CL 105 02/18/2019 1012   CO2 23 02/18/2019 1012   BUN 24 (H) 02/18/2019 1012   BUN 17 01/18/2019   CREATININE 1.08 (H) 02/18/2019 1012   GLU 110 01/18/2019      Component Value Date/Time   CALCIUM 9.4 02/18/2019 1012   ALKPHOS 141 (H) 02/18/2019 1012   AST 27 02/18/2019 1012   ALT 33 02/18/2019 1012   BILITOT 0.3 02/18/2019 1012       RADIOGRAPHIC STUDIES: I have personally reviewed the radiological images as listed and agreed  with the findings in the report. Ir Paracentesis  Result Date: 01/27/2019 INDICATION: Favoring cancer with malignant ascites. Request for therapeutic paracentesis. EXAM: ULTRASOUND GUIDED PARACENTESIS MEDICATIONS: 1% lidocaine 10 mL COMPLICATIONS: None immediate. PROCEDURE: Informed written consent was obtained from the patient after a discussion of the risks, benefits and alternatives to treatment. A timeout was performed prior to the initiation of the procedure. Initial ultrasound scanning demonstrates a moderate amount of ascites within the left lower abdominal quadrant. The left lower abdomen was prepped and draped in the usual sterile fashion. 1% lidocaine with epinephrine was used for local anesthesia. Following this, a 19 gauge, 10-cm, Yueh catheter was introduced. An ultrasound image was saved for documentation purposes. The paracentesis was performed. The catheter was removed and a dressing was applied. The patient tolerated the procedure well without immediate post procedural complication. FINDINGS: A total of approximately 3.7 L of clear yellow fluid was removed. IMPRESSION: Successful ultrasound-guided paracentesis yielding 3 7 liters of peritoneal fluid. Read by: Gareth Eagle PA-C Electronically Signed   By: Corrie Mckusick D.O.   On: 01/27/2019 11:22    All questions were answered. The patient knows to call the clinic with any problems, questions or concerns. No barriers to learning was detected.  I spent 30 minutes counseling the patient face to face. The total time spent in the appointment was 40 minutes and more than 50% was on counseling and review of test results  Heath Lark, MD 02/18/2019 11:19 AM

## 2019-02-18 NOTE — Telephone Encounter (Signed)
Gave avs and calendar ° °

## 2019-02-18 NOTE — Assessment & Plan Note (Signed)
So far, she tolerated chemotherapy well.  The only complication I can see so far is just hair loss She has recurrent ascites We will proceed with chemotherapy today and I will schedule therapeutic paracentesis next week I plan to repeat CT imaging before I see her again in 3 weeks for objective assessment of response to therapy

## 2019-02-18 NOTE — Assessment & Plan Note (Signed)
This is likely anemia of chronic disease. The patient denies recent history of bleeding such as epistaxis, hematuria or hematochezia. She is asymptomatic from the anemia. We will observe for now.  

## 2019-02-18 NOTE — Patient Instructions (Signed)
[facility] Discharge Instructions for Patients Receiving Chemotherapy  Today you received the following chemotherapy agents:  Taxol, Carboplatin  To help prevent nausea and vomiting after your treatment, we encourage you to take your nausea medication as prescribed.   If you develop nausea and vomiting that is not controlled by your nausea medication, call the clinic.   BELOW ARE SYMPTOMS THAT SHOULD BE REPORTED IMMEDIATELY:  *FEVER GREATER THAN 100.5 F  *CHILLS WITH OR WITHOUT FEVER  NAUSEA AND VOMITING THAT IS NOT CONTROLLED WITH YOUR NAUSEA MEDICATION  *UNUSUAL SHORTNESS OF BREATH  *UNUSUAL BRUISING OR BLEEDING  TENDERNESS IN MOUTH AND THROAT WITH OR WITHOUT PRESENCE OF ULCERS  *URINARY PROBLEMS  *BOWEL PROBLEMS  UNUSUAL RASH Items with * indicate a potential emergency and should be followed up as soon as possible.  Feel free to call the clinic should you have any questions or concerns. The clinic phone number is [phone].  Please show the CHEMO ALERT CARD at check-in to the Emergency Department and triage nurse.   

## 2019-02-18 NOTE — Telephone Encounter (Signed)
Left a message for [place] transportation.  Requsted a return call.

## 2019-02-18 NOTE — Telephone Encounter (Addendum)
Haley Palmer with [employer] called back.  Advised her of appointment for paracentesis on Monday, 02/22/19 with arrival at 9:45.  Also notified her of apt for labs at 12 pm and CT at 1 pm on 03/09/19. She verbalized understanding and agreement.

## 2019-02-18 NOTE — Assessment & Plan Note (Signed)
Her leg swelling has improved She will continue Lovenox for now

## 2019-02-18 NOTE — Progress Notes (Signed)
Went to infusion to introduce myself as Arboriculturist and to offer available resources.  Discussed one-time $1000 Radio broadcast assistant to assist with personal expenses while going through treatment. Patient states she can bring proof of income on 03/09/19.  Gave my card for any additional financial questions or concerns.

## 2019-02-18 NOTE — Assessment & Plan Note (Signed)
Her initial CT scan showed pulmonary nodules I will order a CT scan of the chest in 3 weeks for assessment.

## 2019-02-19 LAB — CA 125: Cancer Antigen (CA) 125: 27.6 U/mL (ref 0.0–38.1)

## 2019-02-22 ENCOUNTER — Ambulatory Visit (HOSPITAL_COMMUNITY)
Admission: RE | Admit: 2019-02-22 | Discharge: 2019-02-22 | Disposition: A | Discharge status: Home / Self Care | Payer: Self-pay | Source: Ambulatory Visit | Attending: Hematology and Oncology | Admitting: Hematology and Oncology

## 2019-02-22 ENCOUNTER — Encounter (HOSPITAL_COMMUNITY): Payer: Self-pay | Admitting: Student

## 2019-02-22 DIAGNOSIS — C801 Malignant (primary) neoplasm, unspecified: Secondary | ICD-10-CM | POA: Insufficient documentation

## 2019-02-22 DIAGNOSIS — R180 Malignant ascites: Secondary | ICD-10-CM | POA: Insufficient documentation

## 2019-02-22 HISTORY — PX: IR PARACENTESIS: IMG2679

## 2019-02-22 MED ORDER — LIDOCAINE HCL 1 % IJ SOLN
INTRAMUSCULAR | Status: DC | PRN
  Administered 2019-02-22: 10 mL

## 2019-02-22 MED ORDER — LIDOCAINE HCL 1 % IJ SOLN
INTRAMUSCULAR | Status: AC
  Filled 2019-02-22: qty 20

## 2019-02-22 NOTE — Procedures (Signed)
PROCEDURE SUMMARY:  Successful US guided paracentesis from right lateral abdomen.  Yielded 5.0 liters of yellow fluid.  No immediate complications.  Pt tolerated well.   Specimen was not sent for labs.  EBL < 56mL  Docia Barrier PA-C 02/22/2019 12:04 PM

## 2019-03-04 ENCOUNTER — Encounter: Payer: Self-pay | Admitting: Internal Medicine

## 2019-03-04 ENCOUNTER — Non-Acute Institutional Stay (SKILLED_NURSING_FACILITY): Payer: Self-pay | Admitting: Internal Medicine

## 2019-03-04 DIAGNOSIS — C569 Malignant neoplasm of unspecified ovary: Secondary | ICD-10-CM

## 2019-03-04 DIAGNOSIS — I82412 Acute embolism and thrombosis of left femoral vein: Secondary | ICD-10-CM

## 2019-03-04 DIAGNOSIS — R180 Malignant ascites: Secondary | ICD-10-CM

## 2019-03-04 NOTE — Assessment & Plan Note (Addendum)
Surprisingly asymptomatic except for early satiety.  The early satiety as well as the paracentesis of 5 L may be playing a role in the reported weight loss of 6.29% over the last 13 days. Repeat paracentesis prn if symptoms progress

## 2019-03-04 NOTE — Assessment & Plan Note (Signed)
CT pelvic scan 3/10 Oncology follow-up 3/12

## 2019-03-04 NOTE — Assessment & Plan Note (Addendum)
Edema left lower extremity improved in the context of Lovenox as well as improvement in her albumin 03/04/2019 Homans sign negative Continue Lovenox at SNF until discontinued by Dr. Alvy Bimler

## 2019-03-04 NOTE — Patient Instructions (Signed)
See assessment and plan under each diagnosis in the problem list and acutely for this visit 

## 2019-03-04 NOTE — Assessment & Plan Note (Deleted)
Surprisingly asymptomatic except for early satiety Repeat paracentesis prn if symptoms progress

## 2019-03-04 NOTE — Progress Notes (Signed)
NURSING HOME LOCATION:  [facility] ROOM NUMBER:  [id number] CODE STATUS:  Full Code  PCP:  No PCP  This is a nursing facility follow up for of chronic medical diagnoses; but Nutrition has reported 6.29% weight loss of the last 13 days.  Interim medical record and care since last [place] visit was updated with review of diagnostic studies and change in clinical status since last visit were documented.  HPI: She saw her oncologist Dr. Alvy Bimler 2/20.  Albumin had increased from 2.6 up to 2.9 with associated decrease in left lower extremity edema.  Her Ca125 was 27.6; on 12/27/2018 the value was 40.2.  Despite being on Lovenox prophylaxis for DVT her anemia was stable with hemoglobin 10.6 and hematocrit 32.6.  Chemotherapy for her ovarian cancer was found to be well tolerated with complications only of hair loss and recurrent ascites.  Chemotherapy was administered.  Therapeutic paracentesis of 5 L was completed 2/24.  The paracenteses are scheduled based on symptoms.  At this time she states that she is not having abdominal pain related to the ascites.  She has a follow-up visit with her oncologist 3/12.  She will have a pelvic CT 2 days prior to that visit.  There has been mention of follow-up CT scan to assess pulmonary nodules but this has not been scheduled to date.  Review of systems: She states that she has been markedly sleepy.  She states her appetite is okay but she describes early satiety being able to eat only small amounts at a time.  She does supplement meals with snacks.  She does describe intermittent gas production pre-bowel movements and belching after eating.  Stools are described as loose but not watery.  There is no melena or rectal bleeding.  She surprisingly describes no anxiety or depression.  She states she is frustrated trying to meet financial obligations by phone, being unable to perform this in person.  Constitutional: No fever, fatigue  Eyes: No redness,  discharge, pain, vision change ENT/mouth: No nasal congestion,  purulent discharge, earache, change in hearing, sore throat  Cardiovascular: No chest pain, palpitations, paroxysmal nocturnal dyspnea, claudication Respiratory: No cough, sputum production, hemoptysis, DOE, significant snoring, apnea   Gastrointestinal: No heartburn, dysphagia, nausea /vomiting, rectal bleeding, melena Genitourinary: No dysuria, hematuria, pyuria, incontinence, nocturia Musculoskeletal: No joint stiffness, joint swelling, weakness, pain Dermatologic: No rash, pruritus, change in appearance of skin Neurologic: No dizziness, headache, syncope, seizures, numbness, tingling Psychiatric: No significant anxiety, depression, insomnia, anorexia Endocrine: No change in hair/skin/nails, excessive thirst, excessive hunger, excessive urination  Hematologic/lymphatic: No significant bruising, lymphadenopathy, abnormal bleeding Allergy/immunology: No itchy/watery eyes, significant sneezing, urticaria, angioedema  Physical exam:  Pertinent or positive findings: Hair thinning is noted.  Bowel sounds are present; abdomen is markedly distended but soft and nontender to palpation.  The left lower extremity reveals nonpitting edema and is visibly larger than the right.  Homans sign was negative.  General appearance: Adequately nourished; no acute distress, increased work of breathing is present.   Lymphatic: No lymphadenopathy about the head, neck, axilla. Eyes: No conjunctival inflammation or lid edema is present. There is no scleral icterus. Ears:  External ear exam shows no significant lesions or deformities.   Nose:  External nasal examination shows no deformity or inflammation. Nasal mucosa are pink and moist without lesions, exudates Oral exam:  Lips and gums are healthy appearing. There is no oropharyngeal erythema or exudate. Neck:  No thyromegaly, masses, tenderness noted.    Heart:  Normal rate and regular rhythm. S1 and  S2 normal without gallop, murmur, click, rub .  Lungs: Chest clear to auscultation without wheezes, rhonchi, rales, rubs. GU: Deferred  Extremities:  No cyanosis, clubbing  Neurologic exam : Balance, Rhomberg, finger to nose testing could not be completed due to clinical state Skin: Warm & dry w/o tenting. No significant lesions or rash.  See summary under each active problem in the Problem List with associated updated therapeutic plan

## 2019-03-05 ENCOUNTER — Encounter: Payer: Self-pay | Admitting: Internal Medicine

## 2019-03-05 ENCOUNTER — Telehealth: Payer: Self-pay | Admitting: Oncology

## 2019-03-05 NOTE — Telephone Encounter (Signed)
[facility] and talked with patient's nurse, Jocelyn.  She is aware of when patient needs to drink contrast before the scan and also said transportation has been arranged for all her appointments next week.

## 2019-03-09 ENCOUNTER — Inpatient Hospital Stay: Payer: Self-pay | Attending: Hematology and Oncology

## 2019-03-09 ENCOUNTER — Other Ambulatory Visit: Payer: Self-pay

## 2019-03-09 ENCOUNTER — Inpatient Hospital Stay: Payer: Self-pay

## 2019-03-09 ENCOUNTER — Other Ambulatory Visit: Payer: Self-pay | Admitting: Hematology and Oncology

## 2019-03-09 ENCOUNTER — Ambulatory Visit (HOSPITAL_COMMUNITY)
Admission: RE | Admit: 2019-03-09 | Discharge: 2019-03-09 | Disposition: A | Discharge status: Home / Self Care | Payer: Self-pay | Source: Ambulatory Visit | Attending: Hematology and Oncology | Admitting: Hematology and Oncology

## 2019-03-09 DIAGNOSIS — C569 Malignant neoplasm of unspecified ovary: Secondary | ICD-10-CM

## 2019-03-09 DIAGNOSIS — D630 Anemia in neoplastic disease: Secondary | ICD-10-CM

## 2019-03-09 DIAGNOSIS — R918 Other nonspecific abnormal finding of lung field: Secondary | ICD-10-CM | POA: Insufficient documentation

## 2019-03-09 DIAGNOSIS — R180 Malignant ascites: Secondary | ICD-10-CM | POA: Insufficient documentation

## 2019-03-09 LAB — COMPREHENSIVE METABOLIC PANEL
ALT: 39 U/L (ref 0–44)
AST: 32 U/L (ref 15–41)
Albumin: 2.8 g/dL — ABNORMAL LOW (ref 3.5–5.0)
Alkaline Phosphatase: 146 U/L — ABNORMAL HIGH (ref 38–126)
Anion gap: 9 (ref 5–15)
BUN: 20 mg/dL (ref 8–23)
CO2: 26 mmol/L (ref 22–32)
Calcium: 8.8 mg/dL — ABNORMAL LOW (ref 8.9–10.3)
Chloride: 102 mmol/L (ref 98–111)
Creatinine, Ser: 1.12 mg/dL — ABNORMAL HIGH (ref 0.44–1.00)
GFR calc Af Amer: 56 mL/min — ABNORMAL LOW (ref >60)
GFR, EST NON AFRICAN AMERICAN: 49 mL/min — AB (ref >60)
Glucose, Bld: 96 mg/dL (ref 70–99)
Potassium: 4.2 mmol/L (ref 3.5–5.1)
Sodium: 137 mmol/L (ref 135–145)
Total Bilirubin: 0.8 mg/dL (ref 0.3–1.2)
Total Protein: 6.2 g/dL — ABNORMAL LOW (ref 6.5–8.1)

## 2019-03-09 LAB — CBC WITH DIFFERENTIAL/PLATELET
Abs Immature Granulocytes: 0.09 10*3/uL — ABNORMAL HIGH (ref 0.00–0.07)
Basophils Absolute: 0 10*3/uL (ref 0.0–0.1)
Basophils Relative: 0 %
Eosinophils Absolute: 0 10*3/uL (ref 0.0–0.5)
Eosinophils Relative: 1 %
HCT: 24 % — ABNORMAL LOW (ref 36.0–46.0)
Hemoglobin: 7.6 g/dL — ABNORMAL LOW (ref 12.0–15.0)
Immature Granulocytes: 2 %
LYMPHS PCT: 16 %
Lymphs Abs: 1 10*3/uL (ref 0.7–4.0)
MCH: 30.2 pg (ref 26.0–34.0)
MCHC: 31.7 g/dL (ref 30.0–36.0)
MCV: 95.2 fL (ref 80.0–100.0)
Monocytes Absolute: 1 10*3/uL (ref 0.1–1.0)
Monocytes Relative: 17 %
Neutro Abs: 3.8 10*3/uL (ref 1.7–7.7)
Neutrophils Relative %: 64 %
Platelets: 72 10*3/uL — ABNORMAL LOW (ref 150–400)
RBC: 2.52 MIL/uL — ABNORMAL LOW (ref 3.87–5.11)
RDW: 18.4 % — ABNORMAL HIGH (ref 11.5–15.5)
WBC: 6 10*3/uL (ref 4.0–10.5)
nRBC: 0 % (ref 0.0–0.2)

## 2019-03-09 LAB — SAMPLE TO BLOOD BANK

## 2019-03-09 MED ORDER — IOHEXOL 300 MG/ML  SOLN
100.0000 mL | Freq: Once | INTRAMUSCULAR | Status: AC | PRN
  Administered 2019-03-09: 80 mL via INTRAVENOUS

## 2019-03-09 MED ORDER — SODIUM CHLORIDE (PF) 0.9 % IJ SOLN
INTRAMUSCULAR | Status: AC
  Filled 2019-03-09: qty 50

## 2019-03-11 ENCOUNTER — Other Ambulatory Visit: Payer: Self-pay

## 2019-03-11 ENCOUNTER — Encounter: Payer: Self-pay | Admitting: Oncology

## 2019-03-11 ENCOUNTER — Encounter: Payer: Self-pay | Admitting: Hematology and Oncology

## 2019-03-11 ENCOUNTER — Inpatient Hospital Stay (HOSPITAL_BASED_OUTPATIENT_CLINIC_OR_DEPARTMENT_OTHER): Payer: Self-pay | Admitting: Hematology and Oncology

## 2019-03-11 ENCOUNTER — Inpatient Hospital Stay: Payer: Self-pay

## 2019-03-11 VITALS — BP 120/45 | HR 106 | Temp 97.8°F | Resp 18

## 2019-03-11 DIAGNOSIS — D630 Anemia in neoplastic disease: Secondary | ICD-10-CM

## 2019-03-11 DIAGNOSIS — R180 Malignant ascites: Secondary | ICD-10-CM

## 2019-03-11 DIAGNOSIS — C569 Malignant neoplasm of unspecified ovary: Secondary | ICD-10-CM

## 2019-03-11 DIAGNOSIS — Z7189 Other specified counseling: Secondary | ICD-10-CM

## 2019-03-11 LAB — PREPARE RBC (CROSSMATCH)

## 2019-03-11 MED ORDER — ACETAMINOPHEN 325 MG PO TABS
650.0000 mg | ORAL_TABLET | Freq: Once | ORAL | Status: AC
  Administered 2019-03-11: 650 mg via ORAL

## 2019-03-11 MED ORDER — SODIUM CHLORIDE 0.9% IV SOLUTION
250.0000 mL | Freq: Once | INTRAVENOUS | Status: AC
  Administered 2019-03-11: 250 mL via INTRAVENOUS
  Filled 2019-03-11: qty 250

## 2019-03-11 MED ORDER — DIPHENHYDRAMINE HCL 25 MG PO CAPS
ORAL_CAPSULE | ORAL | Status: AC
  Filled 2019-03-11: qty 1

## 2019-03-11 MED ORDER — DIPHENHYDRAMINE HCL 25 MG PO CAPS
25.0000 mg | ORAL_CAPSULE | Freq: Once | ORAL | Status: AC
  Administered 2019-03-11: 25 mg via ORAL

## 2019-03-11 MED ORDER — ACETAMINOPHEN 325 MG PO TABS
ORAL_TABLET | ORAL | Status: AC
  Filled 2019-03-11: qty 2

## 2019-03-11 NOTE — Progress Notes (Signed)
Haley Glad RN came to get number to billing for patient to contact regarding concerns. Wrote number on the back of my card.  Reviewed my notes and saw that I spoke with patient on 2/20 and gave her resources that may help her with her billing concerns such as applying for [place] and then Financial Assistance through [facility] as well as [employer].  Reviewed additional notes and saw I spoke with patient about [place] and she was to bring proof of income. Patient states she does not have anything with her today. Advised her she can bring award letter or bank statement at her next visit. She verbalized understanding. She has my card for any additional financial questions or concerns.

## 2019-03-11 NOTE — Patient Instructions (Signed)
Blood Transfusion, Adult, Care After This sheet gives you information about how to care for yourself after your procedure. Your doctor may also give you more specific instructions. If you have problems or questions, contact your doctor. Follow these instructions at home:   Take over-the-counter and prescription medicines only as told by your doctor.  Go back to your normal activities as told by your doctor.  Follow instructions from your doctor about how to take care of the area where an IV tube was put into your vein (insertion site). Make sure you: ? Wash your hands with soap and water before you change your bandage (dressing). If there is no soap and water, use hand sanitizer. ? Change your bandage as told by your doctor.  Check your IV insertion site every day for signs of infection. Check for: ? More redness, swelling, or pain. ? More fluid or blood. ? Warmth. ? Pus or a bad smell. Contact a doctor if:  You have more redness, swelling, or pain around the IV insertion site.  You have more fluid or blood coming from the IV insertion site.  Your IV insertion site feels warm to the touch.  You have pus or a bad smell coming from the IV insertion site.  Your pee (urine) turns pink, red, or brown.  You feel weak after doing your normal activities. Get help right away if:  You have signs of a serious allergic or body defense (immune) system reaction, including: ? Itchiness. ? Hives. ? Trouble breathing. ? Anxiety. ? Pain in your chest or lower back. ? Fever, flushing, and chills. ? Fast pulse. ? Rash. ? Watery poop (diarrhea). ? Throwing up (vomiting). ? Dark pee. ? Serious headache. ? Dizziness. ? Stiff neck. ? Yellow color in your face or the white parts of your eyes (jaundice). Summary  After a blood transfusion, return to your normal activities as told by your doctor.  Every day, check for signs of infection where the IV tube was put into your vein.  Some  signs of infection are warm skin, more redness and pain, more fluid or blood, and pus or a bad smell where the needle went in.  Contact your doctor if you feel weak or have any unusual symptoms. This information is not intended to replace advice given to you by your health care provider. Make sure you discuss any questions you have with your health care provider. Document Released: 01/06/2015 Document Revised: 08/09/2016 Document Reviewed: 08/09/2016 Elsevier Interactive Patient Education  2019 [employer] Inc.  

## 2019-03-12 ENCOUNTER — Telehealth: Payer: Self-pay

## 2019-03-12 ENCOUNTER — Encounter: Payer: Self-pay | Admitting: Hematology and Oncology

## 2019-03-12 DIAGNOSIS — Z7189 Other specified counseling: Secondary | ICD-10-CM | POA: Insufficient documentation

## 2019-03-12 LAB — BPAM RBC
BLOOD PRODUCT EXPIRATION DATE: 202004072359
Blood Product Expiration Date: 202004082359
ISSUE DATE / TIME: 202003121307
ISSUE DATE / TIME: 202003121307
Unit Type and Rh: 6200
Unit Type and Rh: 6200

## 2019-03-12 LAB — TYPE AND SCREEN
ABO/RH(D): A POS
Antibody Screen: NEGATIVE
Unit division: 0
Unit division: 0

## 2019-03-12 LAB — ABO/RH: ABO/RH(D): A POS

## 2019-03-12 NOTE — Assessment & Plan Note (Signed)
She has obvious recurrent ascites I will order therapeutic and diagnostic paracentesis

## 2019-03-12 NOTE — Assessment & Plan Note (Signed)
We discussed some of the risks, benefits, and alternatives of blood transfusions. The patient is symptomatic from anemia and the hemoglobin level is critically low.  Some of the side-effects to be expected including risks of transfusion reactions, chills, infection, syndrome of volume overload and risk of hospitalization from various reasons and the patient is willing to proceed and went ahead to sign consent today. She will receive 2 units of blood.  The cause of her anemia is due to chronic disease and recent chemotherapy

## 2019-03-12 NOTE — Assessment & Plan Note (Signed)
I have reviewed imaging study with the patient Unfortunately, she has minimum response to treatment.  She continues to have large volume ascites I have briefly discussed the plan of care with GYN oncologist She is not a surgical candidate due to refractory disease We discussed the risk and benefits of pursuing additional fluid cytology/biopsy I will cancel her treatment I will order therapeutic and paracentesis for symptomatic relief as well as diagnostic purposes If fluid cytology is not helpful, she may need repeat biopsy I will wait for the procedure to be performed before scheduling an appointment to see her back.

## 2019-03-12 NOTE — Telephone Encounter (Signed)
Pt scheduled for paracentesis on Wed 3/18 at 11:00 Call placed to [facility] and [facility] [phone] to arrange transportation.  Facility is aware they will need to have pt at [facility] radiology dept by 10:45 day of procedure.

## 2019-03-12 NOTE — Progress Notes (Signed)
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  She continues to have large volume ascites I have briefly discussed the plan of care with GYN oncologist She is not a surgical candidate due to refractory disease We discussed the risk and benefits of pursuing additional fluid cytology/biopsy I will cancel her treatment I will order therapeutic and paracentesis for symptomatic relief as well as diagnostic purposes If fluid cytology is not helpful, she may need repeat biopsy I will wait for the procedure to be performed before scheduling an appointment to see her back.  Anemia in neoplastic disease We discussed some of the risks, benefits, and alternatives of blood transfusions. The patient is symptomatic from anemia and the hemoglobin level is critically low.  Some of the side-effects to be expected including risks of transfusion reactions, chills, infection, syndrome of volume overload and risk of hospitalization from various reasons and the patient is willing to proceed and went ahead to sign consent today. She will receive 2 units of blood.  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 US Paracentesis    Standing Status:   Future     Standing Expiration Date:   03/10/2020    Order Specific Question:   If therapeutic, is there a maximum amount of fluid to be removed?    Answer:   Yes    Order Specific Question:   What is the maximum amount of fluide to be removed?    Answer:   5 liters    Order Specific Question:   Are labs required for specimen collection?    Answer:   Yes    Order Specific Question:   Lab orders requested (DO NOT place separate lab orders, these will be automatically ordered during procedure specimen collection):    Answer:   Cytology - Non Pap    Order Specific Question:   Is Albumin medication needed?    Answer:   No    Order Specific Question:   Reason for Exam (SYMPTOM  OR DIAGNOSIS REQUIRED)    Answer:   ovarian ca    Order Specific Question:   Preferred imaging location?    Answer:   [facility]    INTERVAL HISTORY: Please see below for problem oriented charting. She returns for further follow-up She continues to have significant abdominal distention despite multiple paracentesis She denies nausea or constipation No peripheral neuropathy She complains of fatigue The patient denies any recent signs or symptoms of bleeding such as spontaneous epistaxis, hematuria or hematochezia.   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 Left: Findings consistent with acute deep vein thrombosis involving the left common femoral vein, and left femoral vein.  Findings consistent with acute superficial vein thrombosis involving the left great saphenous vein. Findings consistent with age indeterminate deep vein thrombosis involving the left proximal profunda vein, left popliteal vein, left posterior tibial vein, and left peroneal vein. Left external iliac vein appears thrombosied. IVC was not viualized. No cystic structure found in the popliteal fossa.    12/27/2018  Imaging    Ct scan of chest, abdomen and pelvis showed: 1. 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 This is associated with a very large volume of (presumably malignant) ascites. 2. Multiple tiny 1-3 mm pulmonary nodules scattered throughout the lungs bilaterally. These are nonspecific, but favored to represent benign areas of mucoid impaction within terminal bronchioles. However, close attention on follow-up studies is recommended to ensure the stability or resolution of this finding, as the possibility of metastatic disease is not excluded. 3. Aortic atherosclerosis, in addition to left anterior descending coronary artery disease. Assessment for potential risk factor modification, dietary therapy or pharmacologic therapy may be warranted, if clinically indicated.     12/27/2018 Tumor Marker    Patient's tumor was tested for the following markers: CA-125. Results of the tumor marker test revealed 40.2.    12/27/2018 - 01/06/2019 Hospital Admission    She was admitted to the hospital and was diagnosed with DVT and ovarian cancer    12/28/2018 Pathology Results    PERITONEAL/ASCITIC FLUID (SPECIMEN 1 OF 1 COLLECTED 12/28/18): NON-SMALL CELL CARCINOMA, FAVOR ADENOCARCINOMA.    12/28/2018 Procedure    Successful ultrasound-guided diagnostic and therapeutic paracentesis yielding 5 liters of peritoneal fluid.    12/30/2018 Echocardiogram    Left ventricle: The cavity size was normal. Wall thickness was normal. Systolic function was normal. The estimated ejection fraction was in the range of 60% to 65%. Wall motion was normal; there were no regional wall motion abnormalities. Doppler parameters are consistent with abnormal left ventricular relaxation (grade 1 diastolic dysfunction).    12/31/2018 Procedure    Successful ultrasound-guided therapeutic paracentesis yielding 9.3 liters of peritoneal  fluid.     12/31/2018 Procedure    This IVC filter is potentially retrievable. The patient will be assessed for filter retrieval by Interventional Radiology in approximately 8-12 weeks. Further recommendations regarding filter retrieval, continued surveillance or declaration of device permanence, will be made at that time.     01/04/2019 -  Chemotherapy    The patient had carboplatin and taxol for chemotherapy treatment.     01/25/2019 Cancer Staging    Staging form: Ovary, Fallopian Tube, and Primary Peritoneal Carcinoma, AJCC 8th Edition - Clinical: cT3, cN0, cM0 - Signed by Heath Lark, MD on 01/25/2019    01/25/2019 Tumor Marker    Patient's tumor was tested for the following markers: CA-125. Results of the tumor marker test revealed 24.9    01/27/2019 Procedure    Successful ultrasound-guided paracentesis yielding 3 7 liters of peritoneal fluid.     02/22/2019 Procedure    Successful ultrasound-guided therapeutic paracentesis yielding 5.0 liters of peritoneal fluid.    03/09/2019 Imaging    30.5 cm mildly complex cystic lesion in the central abdomen, likely associated with the right ovary/adnexa, grossly unchanged.  Associated moderate abdominopelvic ascites, mildly decreased, likely malignant. Suspected mild peritoneal studding/omental caking beneath the anterior abdominal wall.  Minimal subpleural nodularity in the right lower lobe, measuring up to 5 mm (mean diameter), grossly unchanged and indeterminate.  REVIEW OF SYSTEMS:   Constitutional: Denies fevers, chills or abnormal weight loss Eyes: Denies blurriness of vision Ears, nose, mouth, throat, and face: Denies mucositis or sore throat Respiratory: Denies cough, dyspnea or wheezes Cardiovascular: Denies palpitation, chest discomfort  Gastrointestinal:  Denies nausea, heartburn or change in bowel habits Skin: Denies abnormal skin rashes Lymphatics: Denies new lymphadenopathy or easy bruising Neurological:Denies  numbness, tingling or new weaknesses Behavioral/Psych: Mood is stable, no new changes  All other systems were reviewed with the patient and are negative.  I have reviewed the past medical history, past surgical history, social history and family history with the patient and they are unchanged from previous note.  ALLERGIES:  is allergic to penicillins.  MEDICATIONS:  Current Outpatient Medications  Medication Sig Dispense Refill  . bisacodyl (DULCOLAX) 10 MG suppository Constipation (2 of 4): If not relieved by MOM, give 10 mg Bisacodyl suppositiory rectally X 1 dose in 24 hours as needed (Do not use constipation standing orders for residents with renal failure/CFR less than 30. Contact MD for orders)    . bismuth subsalicylate (PEPTO BISMOL) 262 MG/15ML suspension Take 30 mLs by mouth every 4 (four) hours as needed for indigestion.    Marland Kitchen dexamethasone (DECADRON) 4 MG tablet Take 3 tabs at the night before and 3 tab the morning of chemotherapy, every 3 weeks, by mouth 60 tablet 0  . enoxaparin (LOVENOX) 80 MG/0.8ML injection Inject 0.8 mLs (80 mg total) into the skin every 12 (twelve) hours. 22.4 Syringe 1  . lidocaine-prilocaine (EMLA) cream Apply 1 application topically as needed. 30 g 6  . magnesium hydroxide (MILK OF MAGNESIA) 400 MG/5ML suspension Constipation (1 of 4): If no BM in 3 days, give 30 cc Milk of Magnesium p.o. x 1 dose in 24 hours as needed (Do not use standing constipation orders for residents with renal failure CFR less than 30. Contact MD for orders)    . Multiple Vitamins-Minerals (MULTIVITAMIN WITH MINERALS) tablet Take 1 tablet by mouth daily.    . Nutritional Supplements (NUTRITIONAL SUPPLEMENT PO) Take 1 each by mouth 2 (two) times daily. Magic Cup    . ondansetron (ZOFRAN) 8 MG tablet Take 1 tablet (8 mg total) by mouth every 8 (eight) hours as needed for nausea. 30 tablet 3  . prochlorperazine (COMPAZINE) 10 MG tablet Take 1 tablet (10 mg total) by mouth every 6 (six)  hours as needed for nausea or vomiting. 30 tablet 1  . senna (SENOKOT) 8.6 MG TABS tablet Take 2 tablets by mouth at bedtime.    . Sodium Phosphates (RA SALINE ENEMA RE) Constipation (3 of 4): If not relieved by Biscodyl suppository, give disposable Saline Enema rectally X 1 dose/24 hrs as needed (Do not use constipation standing orders for residents with renal failure/CFR less than 30. Contact MD for orders)    Constipation (4 of 4): Contact MD as needed if no results from enema (Nursing Measure)     No current facility-administered medications for this visit.     PHYSICAL EXAMINATION: ECOG PERFORMANCE STATUS: 2 - Symptomatic, <50% confined to bed  Vitals:   03/11/19 1103  BP: (!) 120/45  Pulse: (!) 106  Resp: 18  Temp: 97.8 F (36.6 C)  SpO2: 96%   There were no vitals filed for this visit.  GENERAL:alert, no distress and comfortable.  She looks pale SKIN: skin color, texture, turgor are normal, no rashes or significant lesions EYES: normal, Conjunctiva are pink and non-injected, sclera clear OROPHARYNX:no exudate, no erythema  and lips, buccal mucosa, and tongue normal  NECK: supple, thyroid normal size, non-tender, without nodularity LYMPH:  no palpable lymphadenopathy in the cervical, axillary or inguinal LUNGS: clear to auscultation and percussion with normal breathing effort HEART: regular rate & rhythm and no murmurs with moderate bilateral lower extremity edema ABDOMEN:abdomen soft, distended with ascites Musculoskeletal:no cyanosis of digits and no clubbing  NEURO: alert & oriented x 3 with fluent speech, no focal motor/sensory deficits  LABORATORY DATA:  I have reviewed the data as listed    Component Value Date/Time   NA 137 03/09/2019 1210   NA 141 01/18/2019   K 4.2 03/09/2019 1210   CL 102 03/09/2019 1210   CO2 26 03/09/2019 1210   GLUCOSE 96 03/09/2019 1210   BUN 20 03/09/2019 1210   BUN 17 01/18/2019   CREATININE 1.12 (H) 03/09/2019 1210   CALCIUM 8.8  (L) 03/09/2019 1210   PROT 6.2 (L) 03/09/2019 1210   ALBUMIN 2.8 (L) 03/09/2019 1210   AST 32 03/09/2019 1210   ALT 39 03/09/2019 1210   ALKPHOS 146 (H) 03/09/2019 1210   BILITOT 0.8 03/09/2019 1210   GFRNONAA 49 (L) 03/09/2019 1210   GFRAA 56 (L) 03/09/2019 1210    No results found for: SPEP, UPEP  Lab Results  Component Value Date   WBC 6.0 03/09/2019   NEUTROABS 3.8 03/09/2019   HGB 7.6 (L) 03/09/2019   HCT 24.0 (L) 03/09/2019   MCV 95.2 03/09/2019   PLT 72 (L) 03/09/2019      Chemistry      Component Value Date/Time   NA 137 03/09/2019 1210   NA 141 01/18/2019   K 4.2 03/09/2019 1210   CL 102 03/09/2019 1210   CO2 26 03/09/2019 1210   BUN 20 03/09/2019 1210   BUN 17 01/18/2019   CREATININE 1.12 (H) 03/09/2019 1210   GLU 110 01/18/2019      Component Value Date/Time   CALCIUM 8.8 (L) 03/09/2019 1210   ALKPHOS 146 (H) 03/09/2019 1210   AST 32 03/09/2019 1210   ALT 39 03/09/2019 1210   BILITOT 0.8 03/09/2019 1210       RADIOGRAPHIC STUDIES: I have personally reviewed the radiological images as listed and agreed with the findings in the report. Ct Chest W Contrast  Result Date: 03/09/2019 CLINICAL DATA:  Ovarian cancer, status post chemotherapy EXAM: CT CHEST, ABDOMEN, AND PELVIS WITH CONTRAST TECHNIQUE: Multidetector CT imaging of the chest, abdomen and pelvis was performed following the standard protocol during bolus administration of intravenous contrast. CONTRAST:  65mL OMNIPAQUE IOHEXOL 300 MG/ML  SOLN COMPARISON:  12/27/2018 FINDINGS: CT CHEST FINDINGS Cardiovascular: Heart is normal in size.  No pericardial effusion. No evidence of thoracic aortic aneurysm. Mild Atherosclerotic calcifications of the aortic arch. Mild coronary atherosclerosis of the LAD. Mediastinum/Nodes: No suspicious mediastinal, hilar, or axillary lymphadenopathy. Visualized thyroid is notable for a 14 mm right thyroid nodule. Lungs/Pleura: Mild centrilobular and paraseptal emphysematous  changes, upper lobe predominant. Mild linear/platelike scarring in the left lower lobe. Minimal subpleural nodularity in the medial right lower lobe (series 6/images 55 and 57), measuring up to 6 x 4 mm, unchanged. No focal consolidation. No pleural effusion or pneumothorax. Musculoskeletal: Degenerative changes the lower thoracic spine. CT ABDOMEN PELVIS FINDINGS Hepatobiliary: Tiny probable cysts in the liver measuring up to 4 mm (series 2/image 53). Gallbladder is unremarkable. No intrahepatic or extrahepatic ductal dilatation. Pancreas: Within normal limits. Spleen: Within normal limits. Adrenals/Urinary Tract: Adrenal glands are within normal limits. Kidneys are  within normal limits.  No hydronephrosis. Bladder is underdistended but unremarkable. Stomach/Bowel: Stomach is notable for a tiny hiatal hernia. No evidence of bowel obstruction. Appendix is not discretely visualized. Vascular/Lymphatic: No evidence of abdominal aortic aneurysm. Atherosclerotic calcifications of the abdominal aorta and branch vessels. IVC filter. Reproductive: Uterus is within normal limits. 23.2 x 30.5 x 25.5 cm mildly complex cystic lesion in the central abdomen, likely associated with the right ovary/adnexa, grossly unchanged. Left ovary is not discretely visualized. Other: Moderate abdominopelvic ascites, mildly decreased. Associated mild complexity in the pelvic cul-de-sac (series 2/image 103), suggesting malignant ascites/peritoneal disease. Additional mild peritoneal studding/omental caking is suspected beneath the anterior abdominal wall (series 2/image 64). Musculoskeletal: Degenerative changes of the lumbar spine. IMPRESSION: 30.5 cm mildly complex cystic lesion in the central abdomen, likely associated with the right ovary/adnexa, grossly unchanged. Associated moderate abdominopelvic ascites, mildly decreased, likely malignant. Suspected mild peritoneal studding/omental caking beneath the anterior abdominal wall. Minimal  subpleural nodularity in the right lower lobe, measuring up to 5 mm (mean diameter), grossly unchanged and indeterminate. Electronically Signed   By: Julian Hy M.D.   On: 03/09/2019 16:13   Ct Abdomen Pelvis W Contrast  Result Date: 03/09/2019 CLINICAL DATA:  Ovarian cancer, status post chemotherapy EXAM: CT CHEST, ABDOMEN, AND PELVIS WITH CONTRAST TECHNIQUE: Multidetector CT imaging of the chest, abdomen and pelvis was performed following the standard protocol during bolus administration of intravenous contrast. CONTRAST:  31mL OMNIPAQUE IOHEXOL 300 MG/ML  SOLN COMPARISON:  12/27/2018 FINDINGS: CT CHEST FINDINGS Cardiovascular: Heart is normal in size.  No pericardial effusion. No evidence of thoracic aortic aneurysm. Mild Atherosclerotic calcifications of the aortic arch. Mild coronary atherosclerosis of the LAD. Mediastinum/Nodes: No suspicious mediastinal, hilar, or axillary lymphadenopathy. Visualized thyroid is notable for a 14 mm right thyroid nodule. Lungs/Pleura: Mild centrilobular and paraseptal emphysematous changes, upper lobe predominant. Mild linear/platelike scarring in the left lower lobe. Minimal subpleural nodularity in the medial right lower lobe (series 6/images 55 and 57), measuring up to 6 x 4 mm, unchanged. No focal consolidation. No pleural effusion or pneumothorax. Musculoskeletal: Degenerative changes the lower thoracic spine. CT ABDOMEN PELVIS FINDINGS Hepatobiliary: Tiny probable cysts in the liver measuring up to 4 mm (series 2/image 53). Gallbladder is unremarkable. No intrahepatic or extrahepatic ductal dilatation. Pancreas: Within normal limits. Spleen: Within normal limits. Adrenals/Urinary Tract: Adrenal glands are within normal limits. Kidneys are within normal limits.  No hydronephrosis. Bladder is underdistended but unremarkable. Stomach/Bowel: Stomach is notable for a tiny hiatal hernia. No evidence of bowel obstruction. Appendix is not discretely visualized.  Vascular/Lymphatic: No evidence of abdominal aortic aneurysm. Atherosclerotic calcifications of the abdominal aorta and branch vessels. IVC filter. Reproductive: Uterus is within normal limits. 23.2 x 30.5 x 25.5 cm mildly complex cystic lesion in the central abdomen, likely associated with the right ovary/adnexa, grossly unchanged. Left ovary is not discretely visualized. Other: Moderate abdominopelvic ascites, mildly decreased. Associated mild complexity in the pelvic cul-de-sac (series 2/image 103), suggesting malignant ascites/peritoneal disease. Additional mild peritoneal studding/omental caking is suspected beneath the anterior abdominal wall (series 2/image 64). Musculoskeletal: Degenerative changes of the lumbar spine. IMPRESSION: 30.5 cm mildly complex cystic lesion in the central abdomen, likely associated with the right ovary/adnexa, grossly unchanged. Associated moderate abdominopelvic ascites, mildly decreased, likely malignant. Suspected mild peritoneal studding/omental caking beneath the anterior abdominal wall. Minimal subpleural nodularity in the right lower lobe, measuring up to 5 mm (mean diameter), grossly unchanged and indeterminate. Electronically Signed   By: Henderson Newcomer.D.  On: 03/09/2019 16:13   Ir Paracentesis  Result Date: 02/22/2019 INDICATION: Patient with history of recurrent malignant ascites. Request is made for therapeutic paracentesis up 5 L maximum. EXAM: ULTRASOUND GUIDED THERAPEUTIC PARACENTESIS MEDICATIONS: 10 mL 1% lidocaine COMPLICATIONS: None immediate. PROCEDURE: Informed written consent was obtained from the patient after a discussion of the risks, benefits and alternatives to treatment. A timeout was performed prior to the initiation of the procedure. Initial ultrasound scanning demonstrates a large amount of ascites within the right lower abdominal quadrant. The right lower abdomen was prepped and draped in the usual sterile fashion. 1% lidocaine was used for  local anesthesia. Following this, a 19 gauge, 7-cm, Yueh catheter was introduced. An ultrasound image was saved for documentation purposes. The paracentesis was performed. The catheter was removed and a dressing was applied. The patient tolerated the procedure well without immediate post procedural complication. FINDINGS: A total of approximately 5.0 liters of yellow fluid was removed. IMPRESSION: Successful ultrasound-guided therapeutic paracentesis yielding 5.0 liters of peritoneal fluid. Read by: Brynda Greathouse PA-C Electronically Signed   By: Jacqulynn Cadet M.D.   On: 02/22/2019 12:06    All questions were answered. The patient knows to call the clinic with any problems, questions or concerns. No barriers to learning was detected.  I spent 40 minutes counseling the patient face to face. The total time spent in the appointment was 55 minutes and more than 50% was on counseling and review of test results  Heath Lark, MD 03/12/2019 1:09 PM

## 2019-03-12 NOTE — Assessment & Plan Note (Signed)
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

## 2019-03-17 ENCOUNTER — Other Ambulatory Visit: Payer: Self-pay | Admitting: Hematology and Oncology

## 2019-03-17 ENCOUNTER — Other Ambulatory Visit: Payer: Self-pay

## 2019-03-17 ENCOUNTER — Ambulatory Visit (HOSPITAL_COMMUNITY)
Admission: RE | Admit: 2019-03-17 | Discharge: 2019-03-17 | Disposition: A | Discharge status: Home / Self Care | Payer: Self-pay | Source: Ambulatory Visit | Attending: Hematology and Oncology | Admitting: Hematology and Oncology

## 2019-03-17 DIAGNOSIS — R180 Malignant ascites: Secondary | ICD-10-CM

## 2019-03-17 DIAGNOSIS — C569 Malignant neoplasm of unspecified ovary: Secondary | ICD-10-CM

## 2019-03-17 NOTE — Progress Notes (Signed)
Patient ID: Haley Palmer, female   DOB: 10-03-1946, 73 y.o.   MRN: [id number] Patient presented to ultrasound department today for paracentesis.  On limited ultrasound of abdomen in all 4 quadrants there is minimal free fluid/ascites noted.  Majority of abdominal region is occupied by a large central pelvic cystic mass.  Paracentesis not performed.  Images were reviewed by Dr. Earleen Newport. Pt updated.

## 2019-03-19 ENCOUNTER — Telehealth: Payer: Self-pay

## 2019-03-19 NOTE — Telephone Encounter (Signed)
-----   Message from Heath Lark, MD sent at 03/19/2019  7:33 AM EDT ----- Regarding: appointment Pls call her She did not have paracentesis because the swelling seen was not from fluid but from cancer DId she has any thoughts about whether she wants more chemo? We will have to schedule appt next week to discuss

## 2019-03-19 NOTE — Telephone Encounter (Signed)
Called and given below message. She verbalized understanding. She would like to get chemotherapy if Dr. Alvy Bimler thinks she would benefit. She would like appt to discuss.  [place] [facility] [place] [phone] back and given appt for 3/23 at 11 am, they will arrange transportation. Instructed to arrive 30 minutes early. Called and gave nurse appt details she will give info to Ms. Butlet

## 2019-03-20 ENCOUNTER — Non-Acute Institutional Stay (SKILLED_NURSING_FACILITY): Payer: Medicare Other | Admitting: Internal Medicine

## 2019-03-20 ENCOUNTER — Encounter: Payer: Self-pay | Admitting: Internal Medicine

## 2019-03-20 DIAGNOSIS — F320 Major depressive disorder, single episode, mild: Secondary | ICD-10-CM | POA: Insufficient documentation

## 2019-03-20 DIAGNOSIS — H539 Unspecified visual disturbance: Secondary | ICD-10-CM

## 2019-03-20 NOTE — Assessment & Plan Note (Signed)
Trial of SSRI with titration as clinically indicated This may help anorexia

## 2019-03-20 NOTE — Progress Notes (Signed)
NURSING HOME LOCATION:  [facility] ROOM NUMBER:  [id number]  CODE STATUS:  Full code  PCP:  No PCP on record  This is a nursing facility follow up for specific acute issue of acute visual loss.  Interim medical record and care since last [place] visit was updated with review of diagnostic studies and change in clinical status since last visit were documented.  HPI: On 3/16 while watching TV she had central visual loss described as a "black blotch".  This persisted for approximately 3 hours and then "dissipated" and floaters appeared "like strings".  She has a history of astigmatism but has no history of macular degeneration or other ophthalmologic conditions.  Her ophthalmology care was conducted through [facility] on [address].  She has worn gas-permeable contacts for years; but here she has reverted to older glasses. No history of CVA but obviously may have a higher risk of thrombotic phenomena with her cancer.  She is on Lovenox twice daily.   Yesterday a limited ultrasound of the abdomen revealed minimal amount of free fluid/ascites.  The previously documented large central pelvic cystic mass was unchanged.  Paracentesis was not performed. Her paternal grandmother did have TIAs.  Review of systems: She denies any other sensory deficits.  She denies any other neurologic or cardiac symptoms.  She has had some exertional dyspnea which she relates to her profound abdominal distention She does describe fatigue as well as anorexia.  She relates her anorexia due to the food here at the facility.  States she is not getting her Magic Cups on a regular basis.  She describes intermittent constipation as well as intermittent loose/runny stools.  There is been no frank diarrhea. She describes some depression which she relates to having to give her cat away as well as the financial stresses under which she is living.   Constitutional: No fever, significant weight change Eyes: No  redness, discharge, pain ENT/mouth: No nasal congestion,  purulent discharge, earache, change in hearing, sore throat  Cardiovascular: No chest pain, palpitations, paroxysmal nocturnal dyspnea, claudication, edema  Respiratory: No cough, sputum production, hemoptysis, significant snoring, apnea   Gastrointestinal: No heartburn, dysphagia, abdominal pain, nausea /vomiting, rectal bleeding, melena Genitourinary: No dysuria, hematuria, pyuria, incontinence, nocturia Musculoskeletal: No joint stiffness, joint swelling,pain Dermatologic: No rash, pruritus, change in appearance of skin Neurologic: No dizziness, headache, syncope, seizures, numbness, tingling Psychiatric: No insomnia Endocrine: No change in hair/skin/nails, excessive thirst, excessive hunger, excessive urination  Hematologic/lymphatic: No significant bruising, lymphadenopathy, abnormal bleeding Allergy/immunology: No itchy/watery eyes, significant sneezing, urticaria, angioedema  Physical exam:  Pertinent or positive findings:No visual field deficit. Normal vision with glasses & EOM intact.Patchy alopecia.Tight distended abdomen with decreased bowel sounds.Decreased DPP.Trace edema.RUE slightly weaker than LUE.  General appearance: no acute distress, increased work of breathing is present.   Lymphatic: No lymphadenopathy about the head, neck, axilla. Eyes: No conjunctival inflammation or lid edema is present. There is no scleral icterus. Ears:  External ear exam shows no significant lesions or deformities.   Nose:  External nasal examination shows no deformity or inflammation. Nasal mucosa are pink and moist without lesions, exudates Oral exam:  Lips and gums are healthy appearing. There is no oropharyngeal erythema or exudate. Neck:  No thyromegaly, masses, tenderness noted.    Heart:  Normal rate and regular rhythm. S1 and S2 normal without gallop, murmur, click, rub .  Lungs: Chest clear to auscultation without wheezes, rhonchi,  rales, rubs. GU: Deferred  Extremities:  No  cyanosis, clubbing Skin: Warm & dry w/o tenting. No significant lesions or rash.  See summary under each active problem in the Problem List with associated updated therapeutic plan

## 2019-03-20 NOTE — Patient Instructions (Signed)
See assessment and plan under each diagnosis in the problem list and acutely for this visit 

## 2019-03-22 ENCOUNTER — Telehealth: Payer: Self-pay | Admitting: Oncology

## 2019-03-22 ENCOUNTER — Ambulatory Visit: Payer: Self-pay | Admitting: Hematology and Oncology

## 2019-03-31 NOTE — Telephone Encounter (Signed)
Called [facility] at [phone] to reschedule appointment with Dr. Alvy Bimler.  The receptionist said she passed away at 8:30 am this morning and could not give any details on what happened.

## 2019-03-31 DEATH — deceased

## 2020-01-01 IMAGING — CT CT ABD-PELV W/O CM
2 of 5 series · 12 of 36 positions shown, 15 images · non-contrast
Comparison: None.

CLINICAL DATA: 72-year-old female with history of abdominal
distension. Evaluate for potential mass.

EXAM:
CT CHEST, ABDOMEN AND PELVIS WITHOUT CONTRAST
TECHNIQUE: Multidetector CT imaging of the chest, abdomen and pelvis was
performed following the standard protocol without IV contrast.

[Series 2: cap w/o · axial · non-contrast · 0.90mm/px · z∈[+857,+1337]mm · 9 of 122 slices shown, 12 images]
[im 13/122  mediastinal]
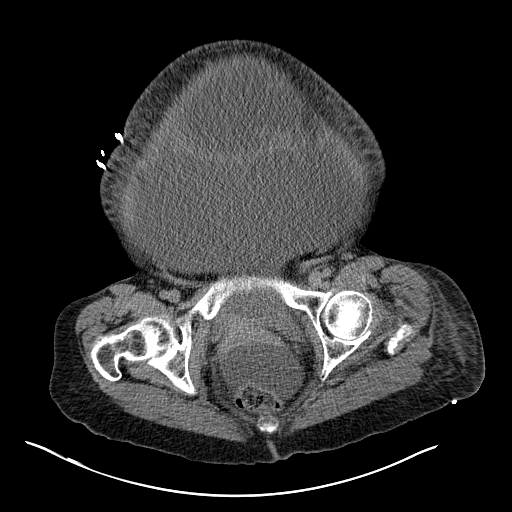
[im 13/122  lung]
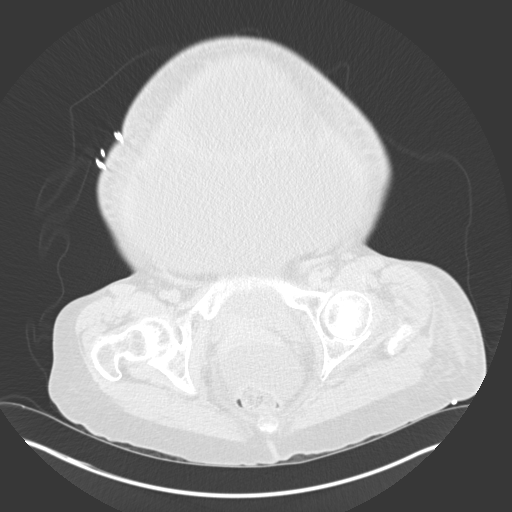
[im 25/122  lung]
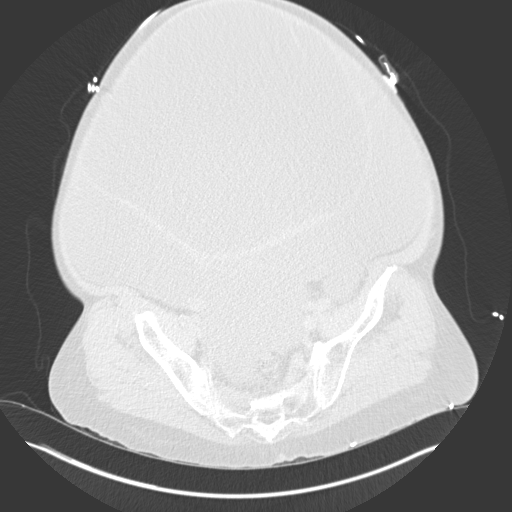
[im 37/122  lung]
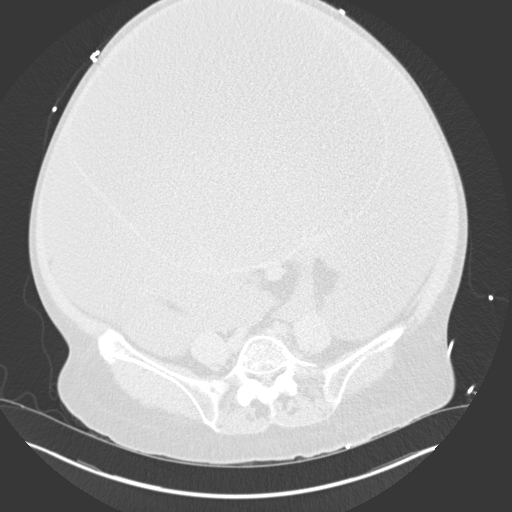
[im 49/122  lung]
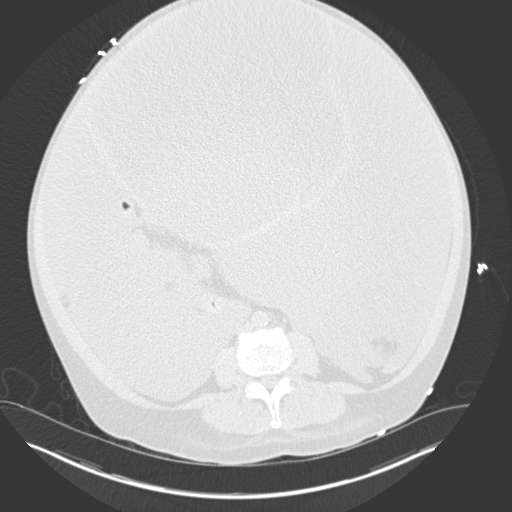
[im 61/122  mediastinal]
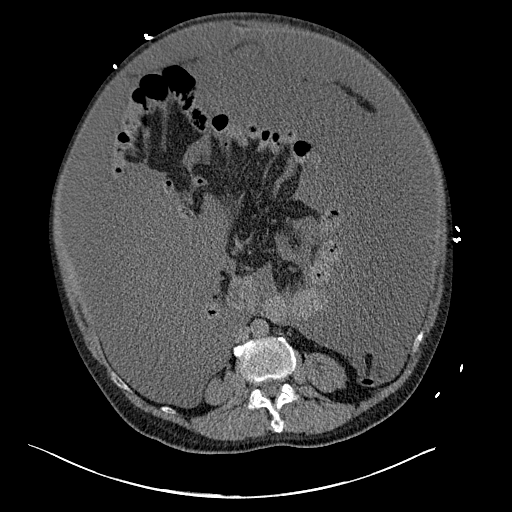
[im 61/122  lung]
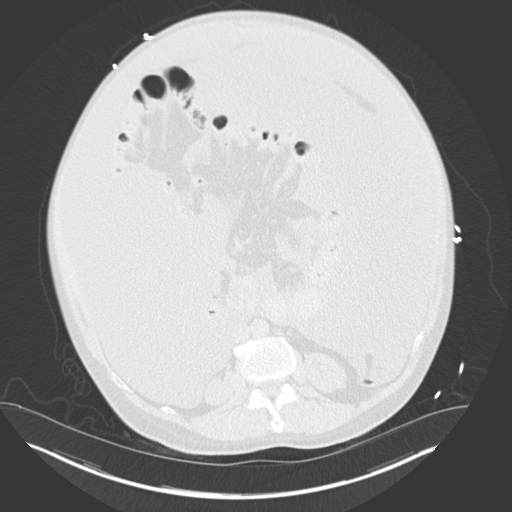
[im 73/122  lung]
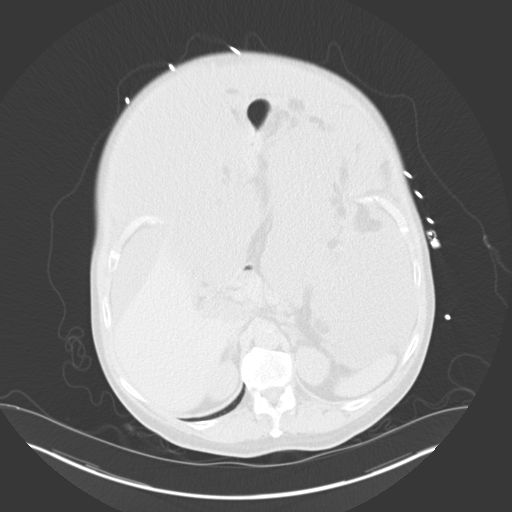
[im 85/122  lung]
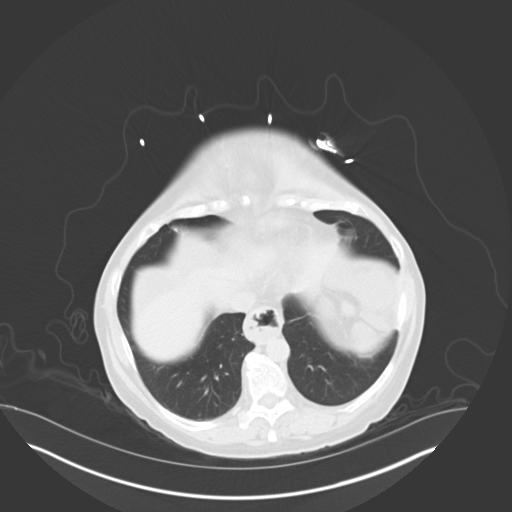
[im 97/122  lung]
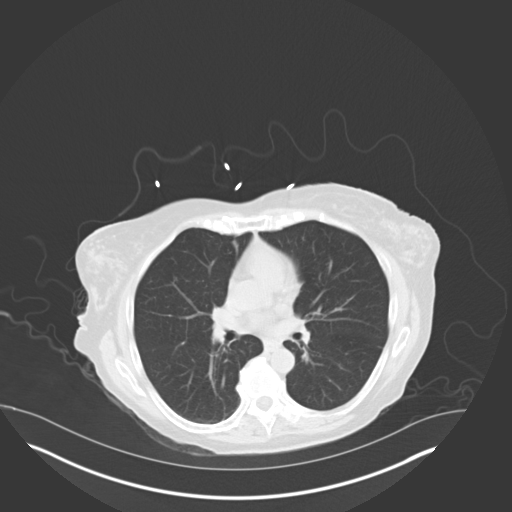
[im 109/122  mediastinal]
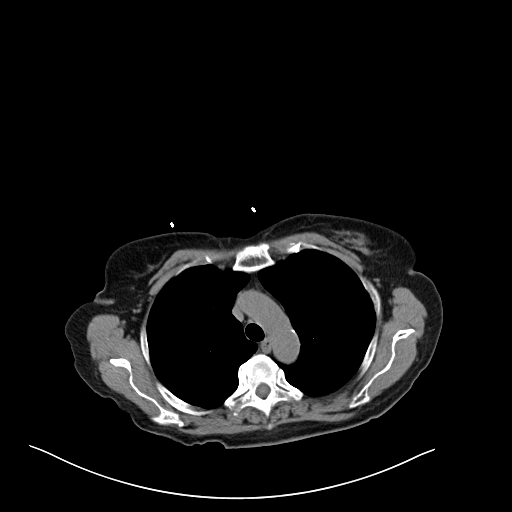
[im 109/122  lung]
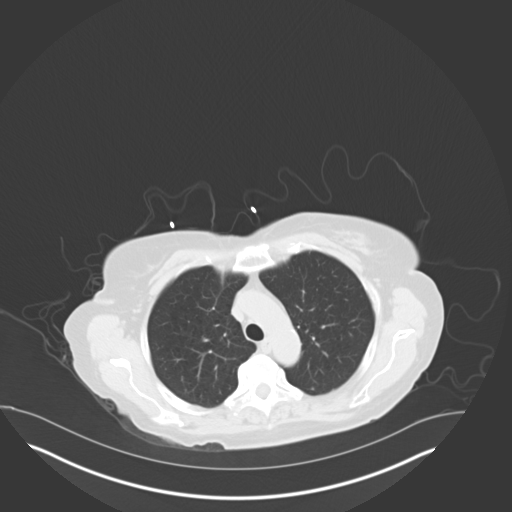

[Series 6: coronals · coronal · 0.91mm/px · 3 of 216 slices shown]
[im 44/216  lung]
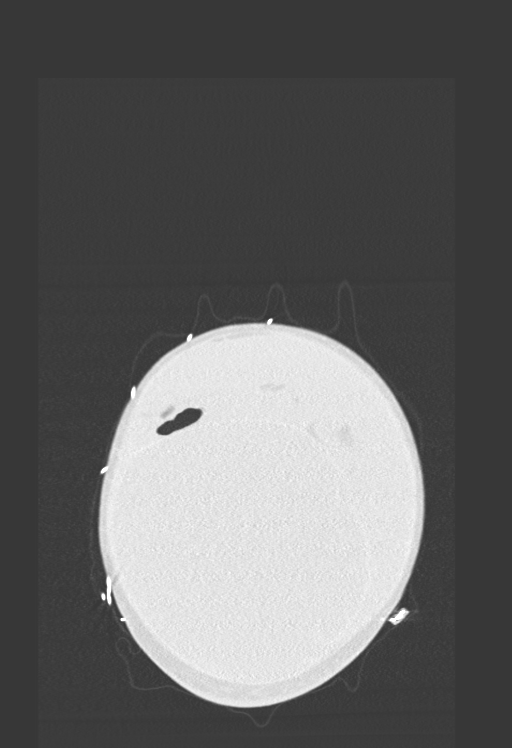
[im 87/216  lung]
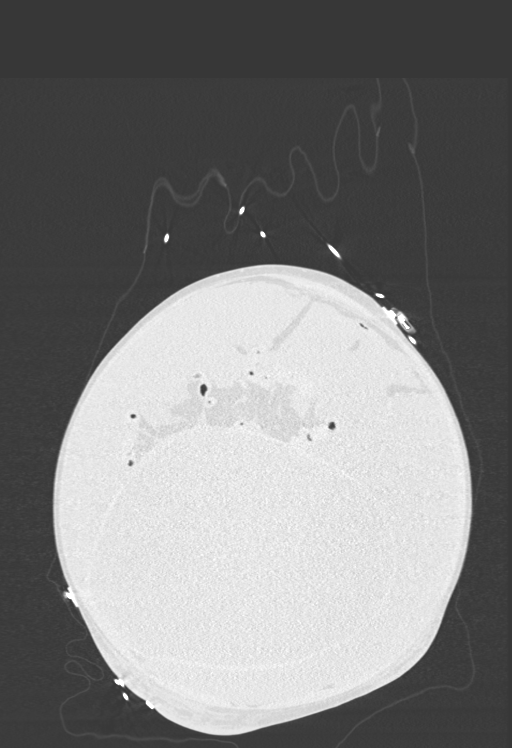
[im 130/216  lung]
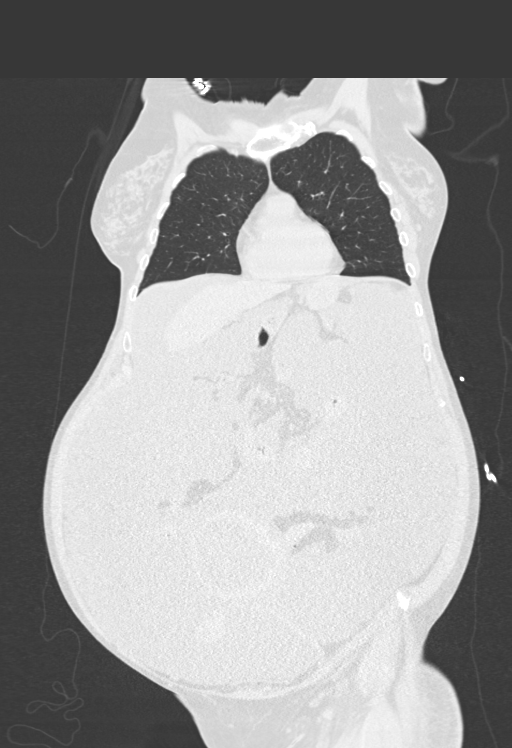

[12 of 36 positions shown; findings below may reference images not displayed]

FINDINGS: CT CHEST FINDINGS

Cardiovascular: Heart size is normal. There is no significant
pericardial fluid, thickening or pericardial calcification.
Atherosclerotic calcifications in the left anterior descending
coronary artery.

Mediastinum/Nodes: No pathologically enlarged mediastinal or hilar
lymph nodes. Moderate-sized hiatal hernia. No axillary
lymphadenopathy.

Lungs/Pleura: Mild diffuse bronchial wall thickening with mild
centrilobular and paraseptal emphysema. Innumerable tiny 1-3 mm
pulmonary nodules scattered throughout the periphery of the lungs
bilaterally, nonspecific. No larger more suspicious appearing
pulmonary nodules or masses are noted. No acute consolidative
airspace disease. No pleural effusions. Areas of linear scarring
noted in the right middle lobe, inferior segment of the lingula and
anterior aspect of the left lower lobe.

Musculoskeletal: There are no aggressive appearing lytic or blastic
lesions noted in the visualized portions of the skeleton.

CT ABDOMEN PELVIS FINDINGS

Hepatobiliary: No definite cystic or solid hepatic lesions are
confidently identified on today's noncontrast CT examination.
Gallbladder is not confidently identified may be surgically absent,
or may be obscured by adjacent ascites.

Pancreas: No definite pancreatic mass noted on today's noncontrast
CT examination. No definite surrounding peripancreatic inflammatory
changes.

Spleen: Unremarkable.

Adrenals/Urinary Tract: Unenhanced appearance of the kidneys and
bilateral adrenal glands is normal. No hydroureteronephrosis.
Urinary bladder is poorly demonstrated, but unremarkable in
appearance.

Stomach/Bowel: Normal appearance of the stomach. No pathologic
dilatation of small bowel or colon. Appendix is not confidently
identified.

Vascular/Lymphatic: Aortic atherosclerosis. No lymphadenopathy
identified in the abdomen or pelvis on today's noncontrast CT
examination.

Reproductive: Unenhanced appearance of the uterus is unremarkable.
In the central abdomen and pelvis there is a very large centrally
low-attenuation rim enhancing mass (axial image 85 of series 2 and
sagittal image 92 of series 7) measuring 28.7 x 25.6 x 25.6 cm. This
appears potentially related to the right adnexal region, although
the origin of this lesion is uncertain. Left ovary is not
confidently identified.

Other: Large volume of ascites.  No pneumoperitoneum.

Musculoskeletal: There are no aggressive appearing lytic or blastic
lesions noted in the visualized portions of the skeleton.
IMPRESSION: 1. 28.7 x 25.6 x 25.6 cm mass in the central abdomen and pelvis,
strongly favored to be of ovarian origin, highly concerning for
ovarian neoplasm. This is associated with a very large volume of
(presumably malignant) ascites.
2. Multiple tiny 1-3 mm pulmonary nodules scattered throughout the
lungs bilaterally. These are nonspecific, but favored to represent
benign areas of mucoid impaction within terminal bronchioles.
However, close attention on follow-up studies is recommended to
ensure the stability or resolution of this finding, as the
possibility of metastatic disease is not excluded.
3. Aortic atherosclerosis, in addition to left anterior descending
coronary artery disease. Assessment for potential risk factor
modification, dietary therapy or pharmacologic therapy may be
warranted, if clinically indicated.

## 2020-01-01 IMAGING — DX DG CHEST 1V PORT
1 series · 1 of 1 positions shown · non-contrast
Comparison: None.

CLINICAL DATA: Abdominal distension and leg swelling

EXAM:
PORTABLE CHEST 1 VIEW

[chest ap]
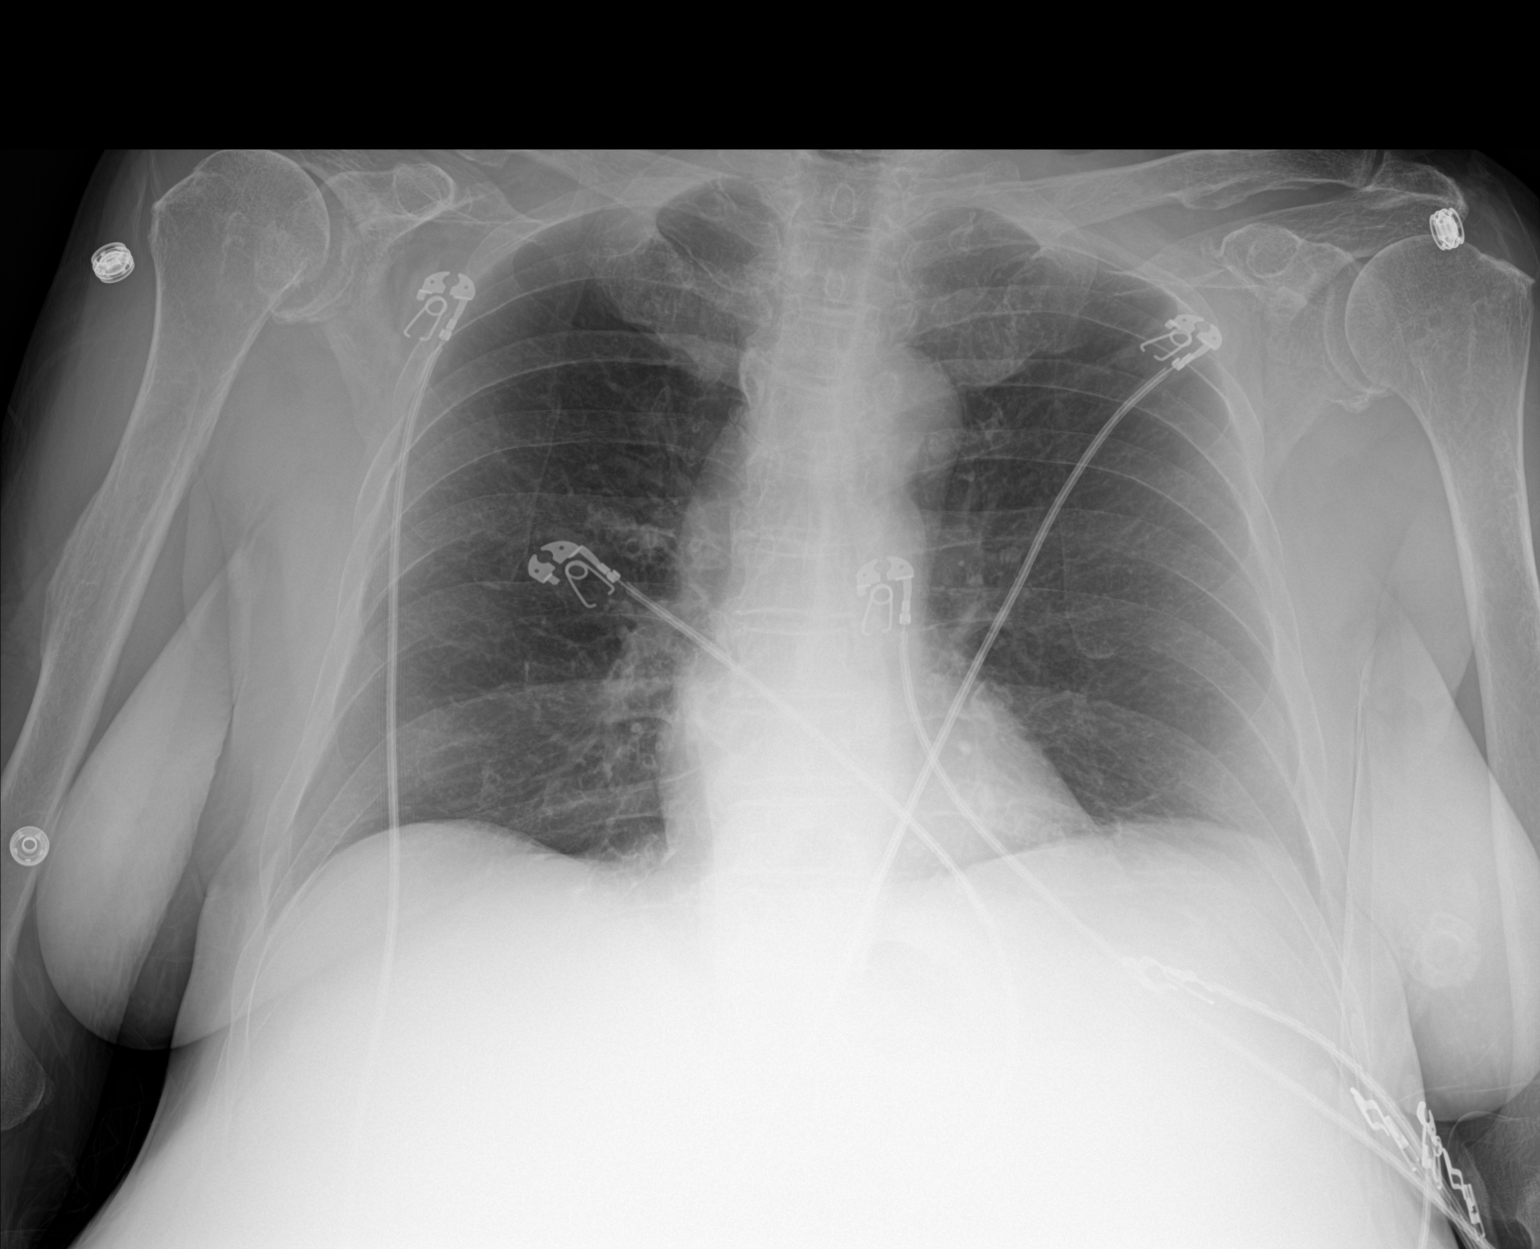

[1 of 1 positions shown; findings below may reference images not displayed]

FINDINGS: Monitoring leads overlie the patient. Normal cardiac and mediastinal
contours. No consolidative pulmonary opacities. No pleural effusion
or pneumothorax. Thoracic spine degenerative changes.
IMPRESSION: No active disease.

## 2020-01-02 IMAGING — US US PARACENTESIS
1 series · 8 of 8 positions shown · non-contrast
Comparison: none

INDICATION: Patient with history of lower extremity DVT, abdomino-pelvic mass,
renal insufficiency, ascites ; request made for diagnostic and
therapeutic paracentesis up to 5 liters.

[Series 1: us paracentesis · 8 of 8 slices shown]
[im 1/8]
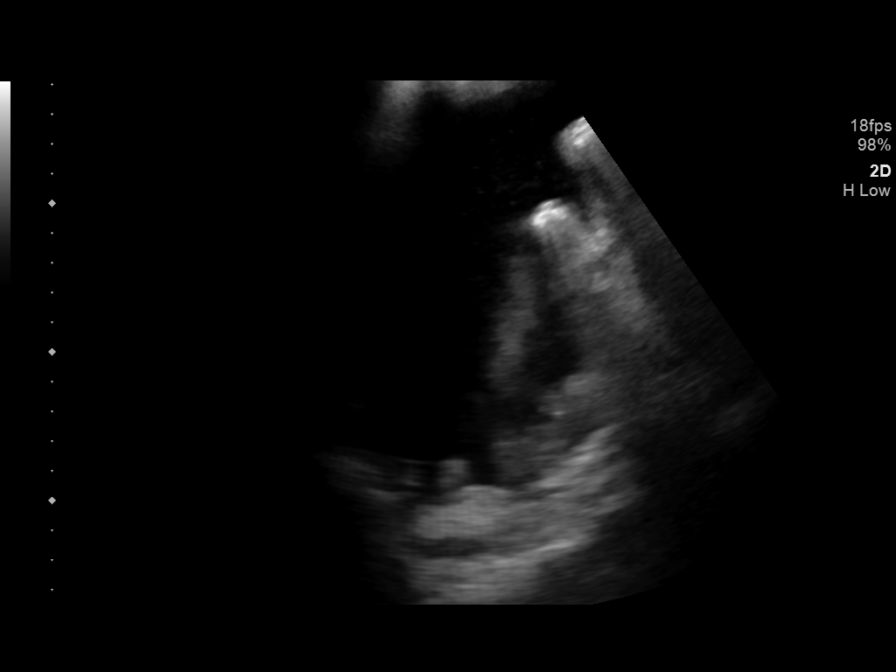
[im 2/8]
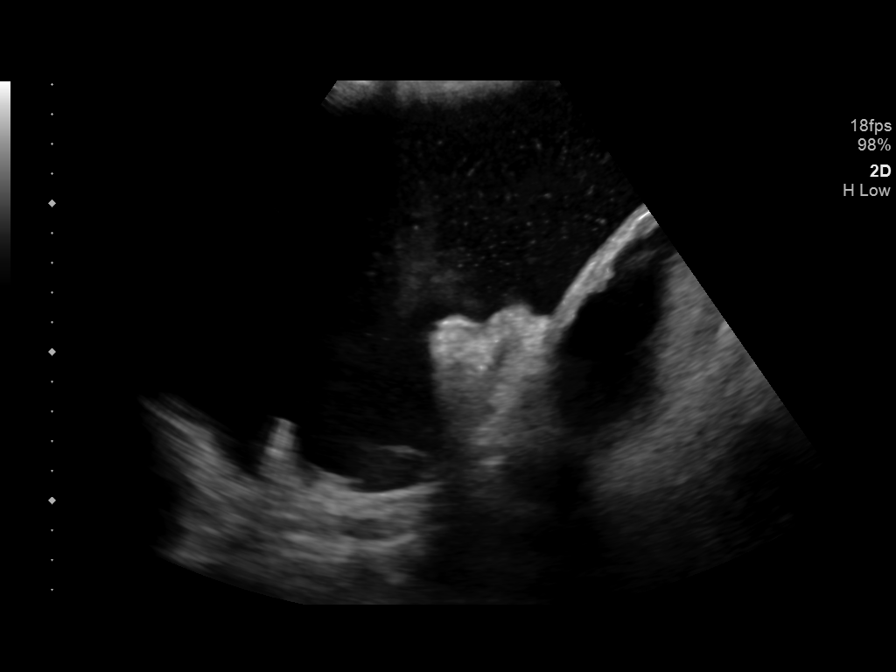
[im 3/8]
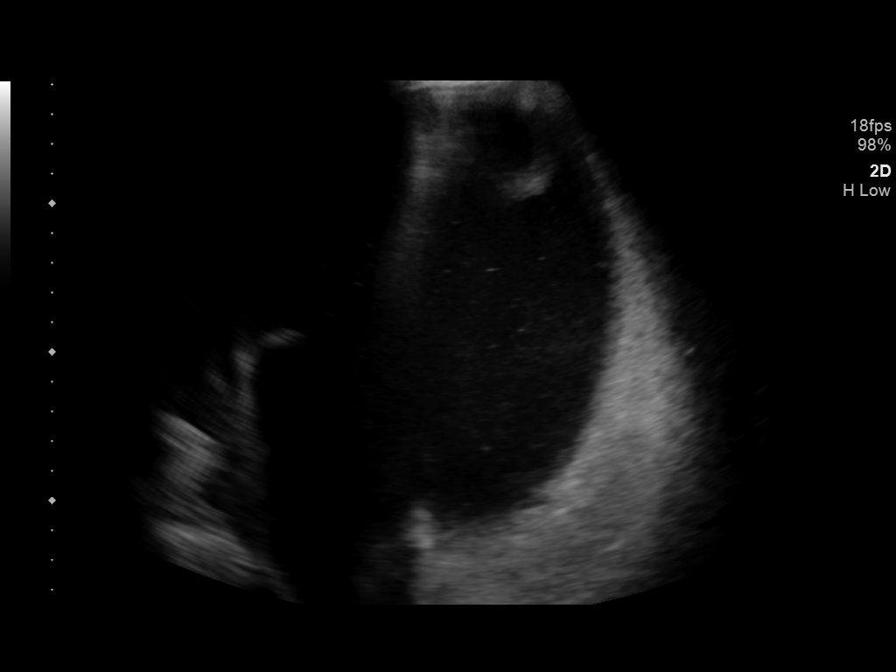
[im 4/8]
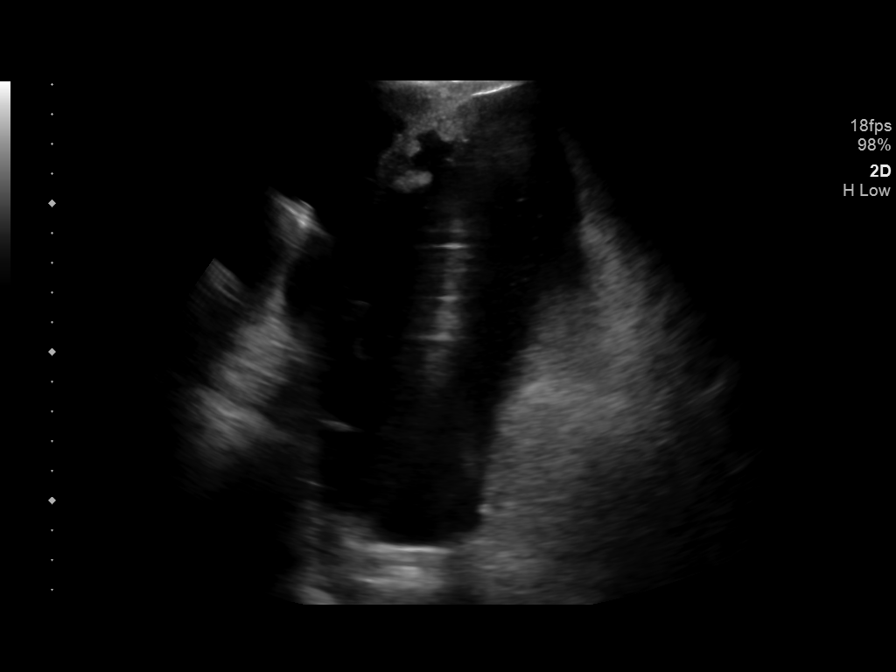
[im 5/8]
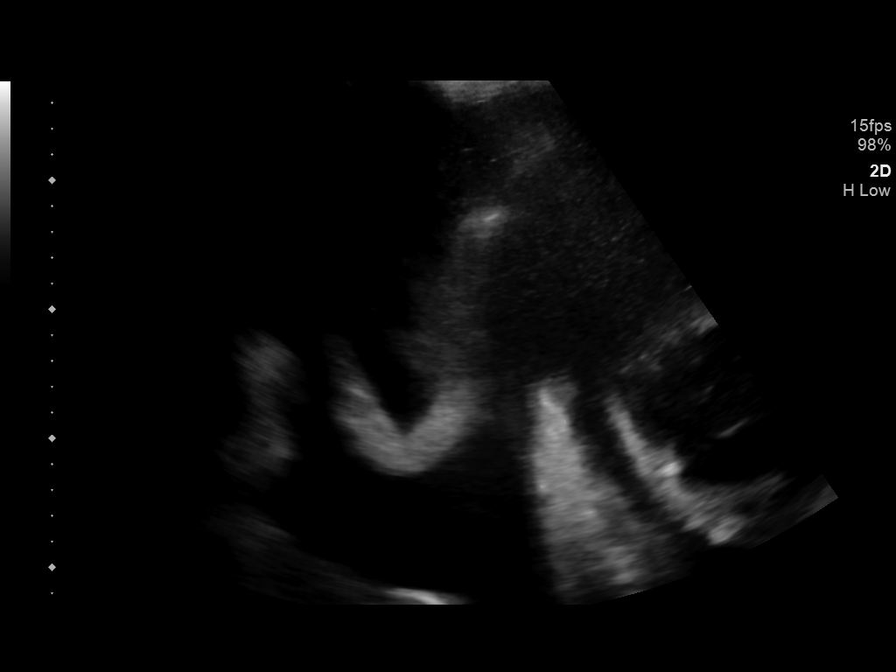
[im 6/8]
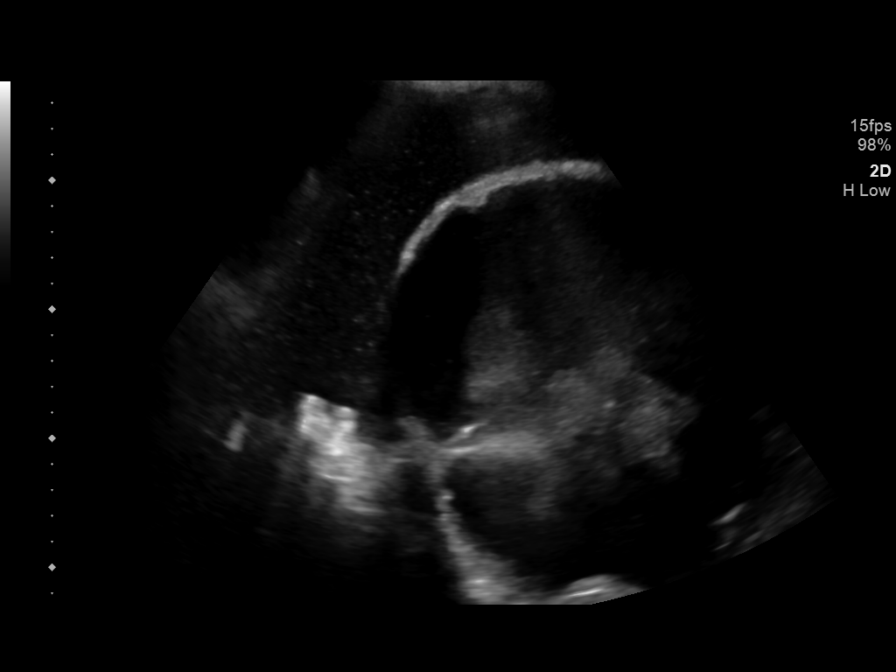
[im 7/8]
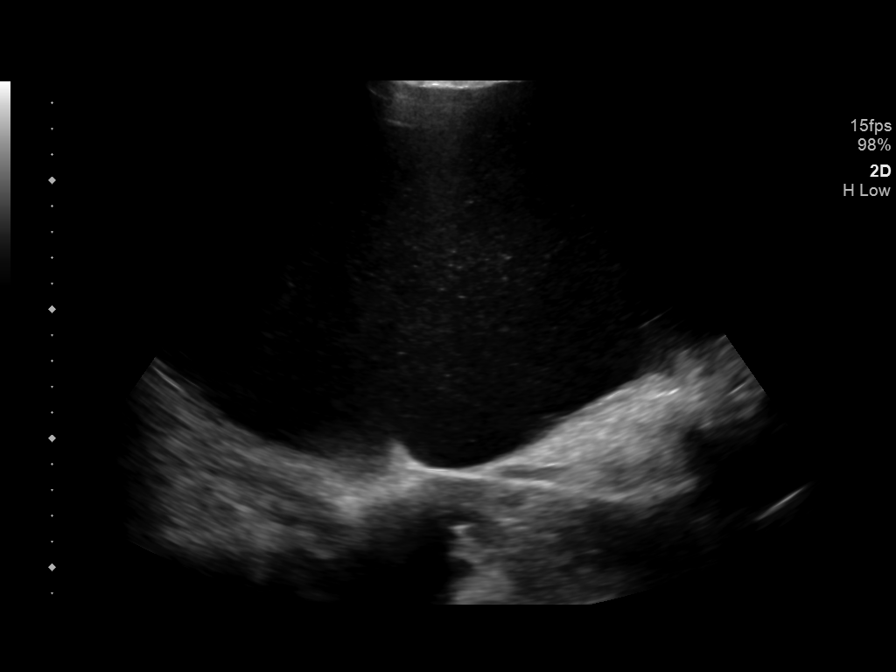
[im 8/8]
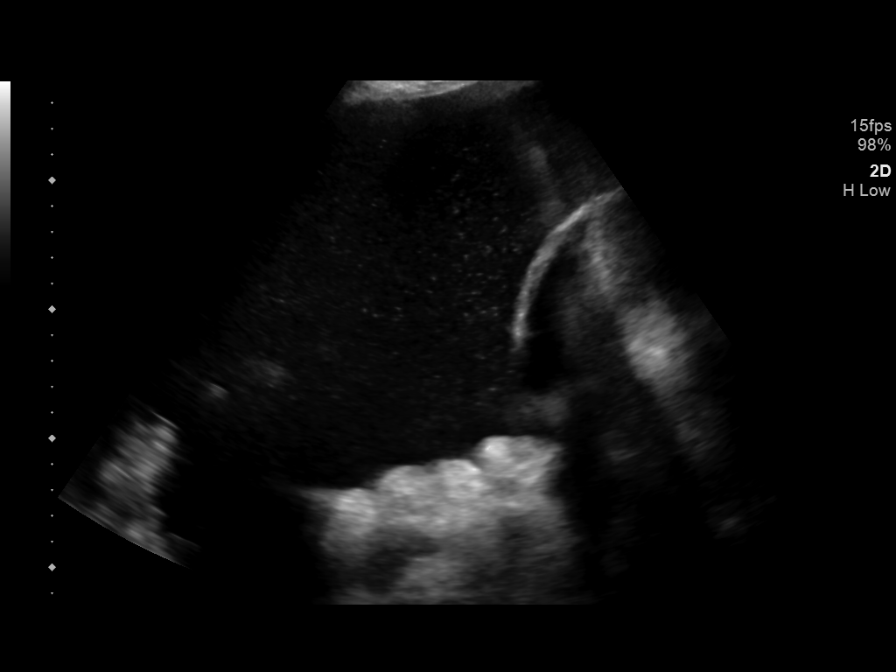

[8 of 8 positions shown; findings below may reference images not displayed]

EXAM:
ULTRASOUND GUIDED DIAGNOSTIC AND THERAPEUTIC PARACENTESIS

MEDICATIONS:
None

COMPLICATIONS:
None immediate.

PROCEDURE:
Informed written consent was obtained from the patient after a
discussion of the risks, benefits and alternatives to treatment. A
timeout was performed prior to the initiation of the procedure.

Initial ultrasound scanning demonstrates a large amount of ascites
within the left mid to lower abdominal quadrant. The left mid to
lower abdomen was prepped and draped in the usual sterile fashion.
1% lidocaine was used for local anesthesia.

Following this, a 19 gauge, 7-cm, Yueh catheter was introduced. An
ultrasound image was saved for documentation purposes. The
paracentesis was performed. The catheter was removed and a dressing
was applied. The patient tolerated the procedure well without
immediate post procedural complication.
FINDINGS: A total of approximately 5 liters of slightly hazy, yellow fluid was
removed. Samples were sent to the laboratory as requested by the
clinical team.
IMPRESSION: Successful ultrasound-guided diagnostic and therapeutic paracentesis
yielding 5 liters of peritoneal fluid.

## 2020-01-05 IMAGING — US US PARACENTESIS
1 series · 6 of 6 positions shown · non-contrast
Comparison: none

INDICATION: Patient with history of metastatic adenocarcinoma, abdominopelvic
mass, recurrent malignant ascites. Request made for therapeutic
paracentesis.

[Series 1: us paracentesis · 6 of 6 slices shown]
[im 1/6]
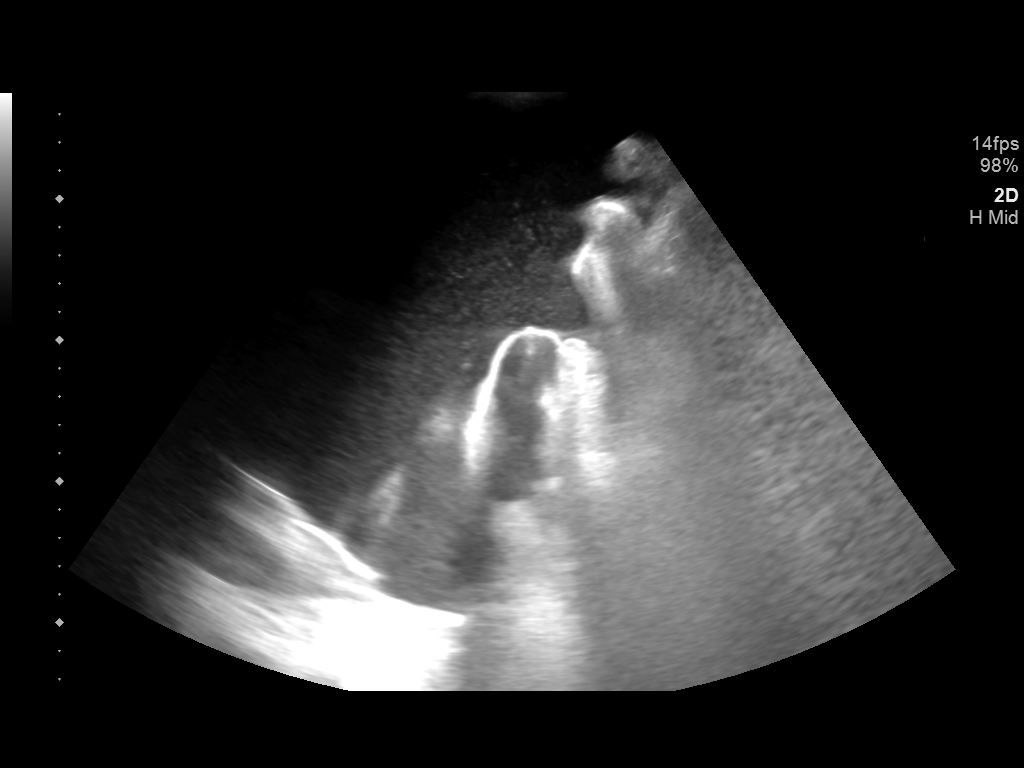
[im 2/6]
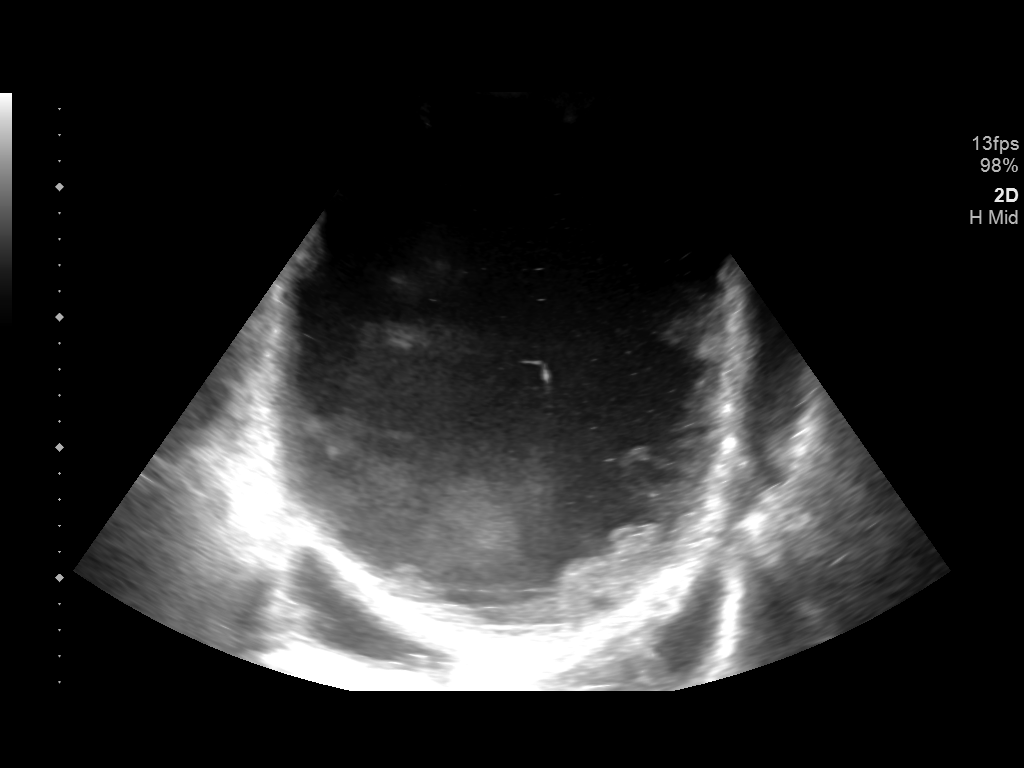
[im 3/6]
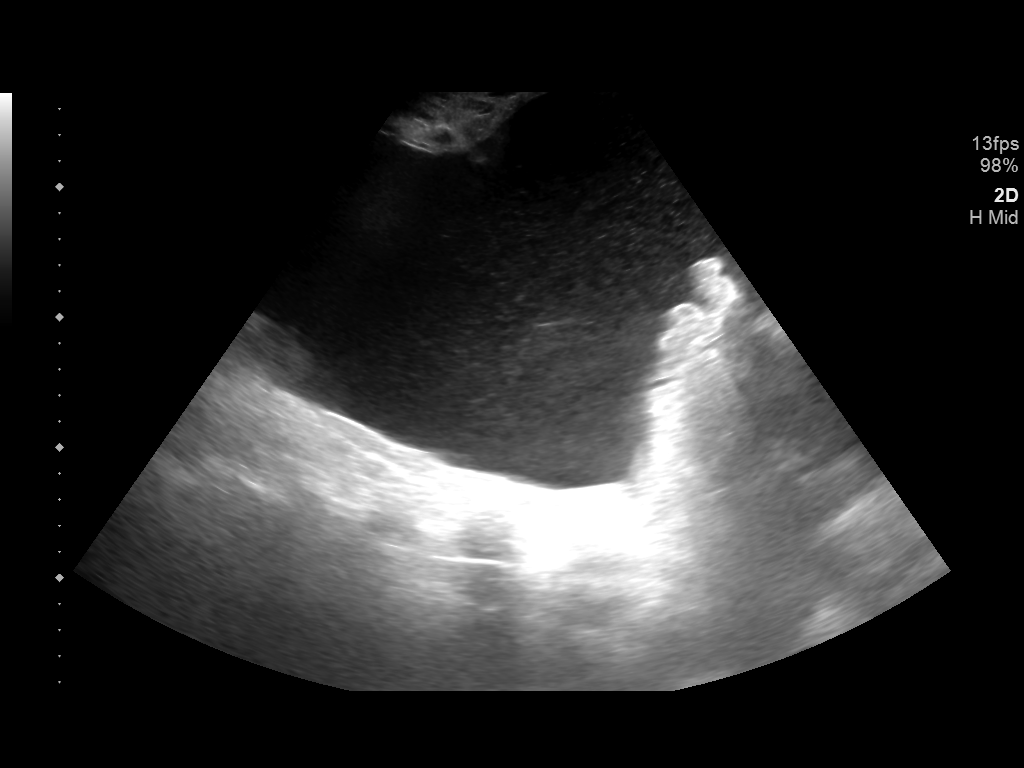
[im 4/6]
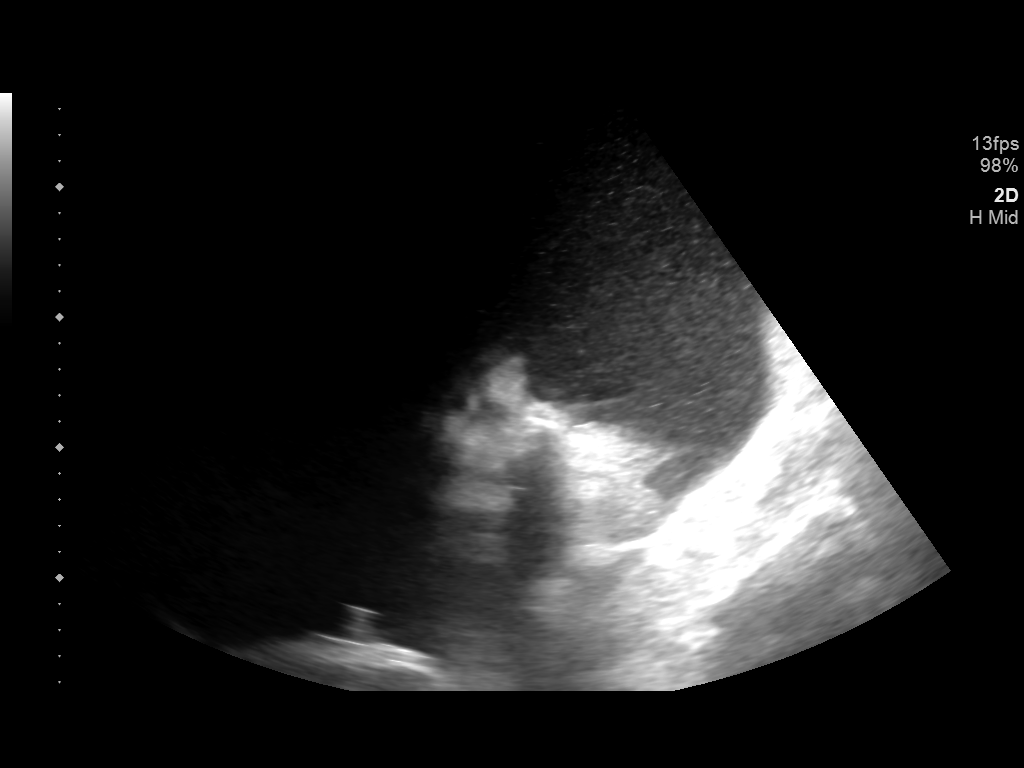
[im 5/6]
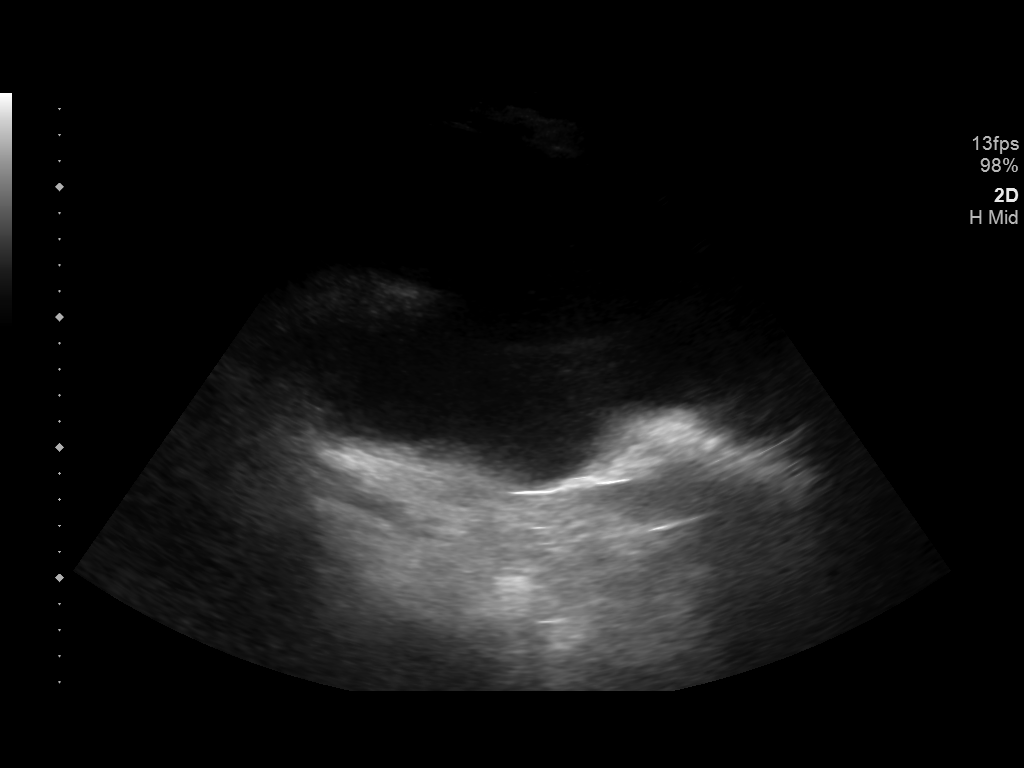
[im 6/6]
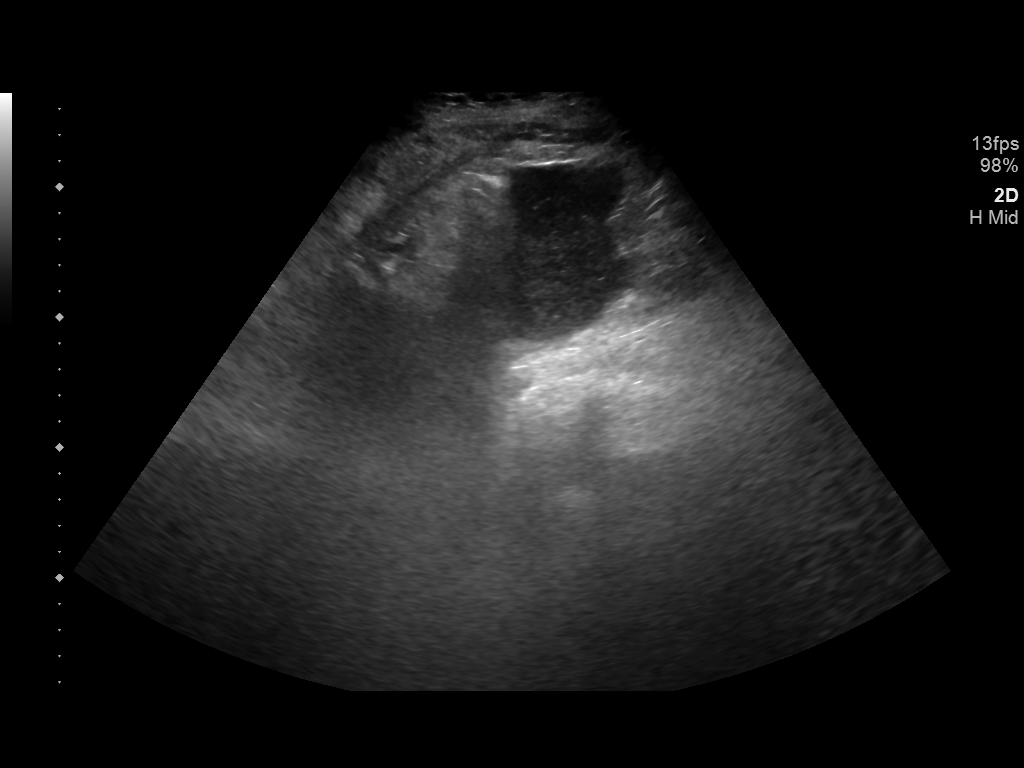

[6 of 6 positions shown; findings below may reference images not displayed]

EXAM:
ULTRASOUND GUIDED THERAPEUTIC PARACENTESIS

MEDICATIONS:
None

COMPLICATIONS:
None immediate.

PROCEDURE:
Informed written consent was obtained from the patient after a
discussion of the risks, benefits and alternatives to treatment. A
timeout was performed prior to the initiation of the procedure.

Initial ultrasound scanning demonstrates a large amount of ascites
within the left lower abdominal quadrant. The left lower abdomen was
prepped and draped in the usual sterile fashion. 1% lidocaine was
used for local anesthesia.

Following this, a 19 gauge, 10-cm, Yueh catheter was introduced. An
ultrasound image was saved for documentation purposes. The
paracentesis was performed. The catheter was removed and a dressing
was applied. The patient tolerated the procedure well without
immediate post procedural complication.
FINDINGS: A total of approximately 9.3 liters of slightly hazy, yellow fluid
was removed.
IMPRESSION: Successful ultrasound-guided therapeutic paracentesis yielding
liters of peritoneal fluid.

## 2020-01-05 IMAGING — US IR IVC FILTER PLMT / S&I /IMG GUID/MOD SED
1 series · 3 of 3 positions shown · non-contrast
Comparison: none

INDICATION: 72-year-old with a large central abdominal and pelvic mass.

[Series 1: ir (id) (id)/(id)/(id) ir · 3 of 3 slices shown]
[im 1/3]
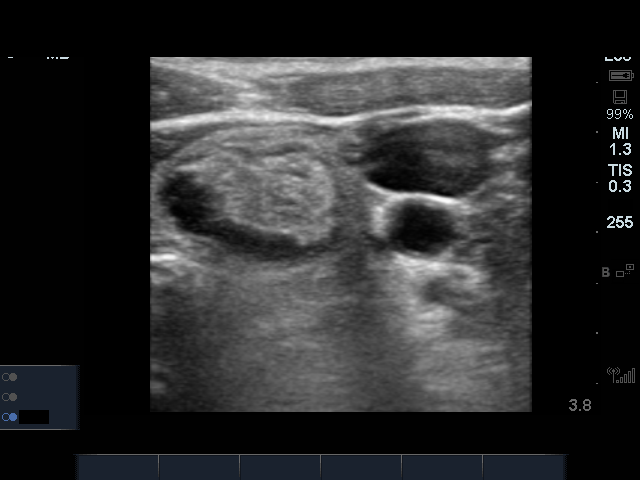
[im 2/3]
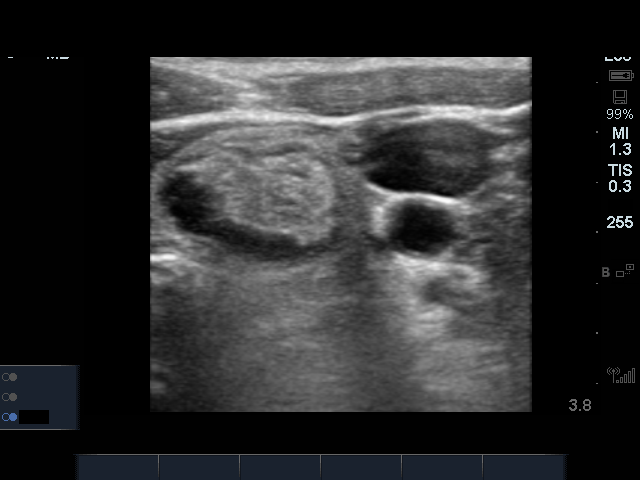
[im 3/3]
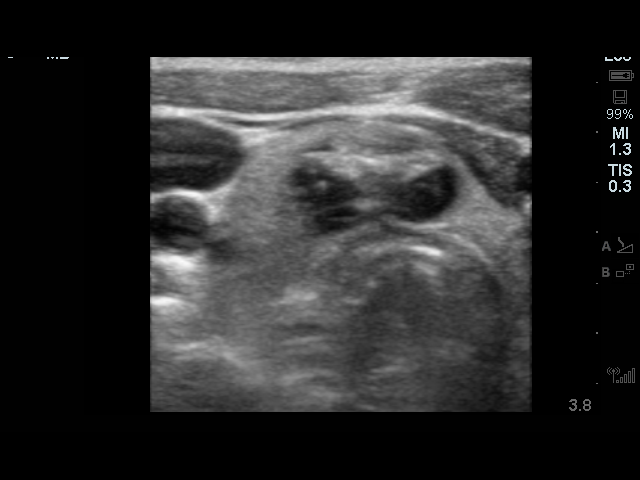

[3 of 3 positions shown; findings below may reference images not displayed]

EXAM:
IVC FILTER PLACEMENT; IVC VENOGRAM; ULTRASOUND FOR VASCULAR ACCESS

MEDICATIONS:
None.

ANESTHESIA/SEDATION:
Fentanyl 100 mcg IV; Versed 2.0 mg IV

Moderate Sedation Time:  26 minutes minutes

The patient was continuously monitored during the procedure by the
interventional radiology nurse under my direct supervision.

CONTRAST:  Carbon dioxide

FLUOROSCOPY TIME:  Fluoroscopy Time: 4 minutes 48 seconds (437 mGy).

COMPLICATIONS:
None immediate.

PROCEDURE:
The procedure was explained to the patient. The risks and benefits
of the procedure were discussed and the patient's questions were
addressed. Informed consent was obtained from the patient.
Ultrasound demonstrated a patent right internal jugular vein.
Ultrasound images were obtained for documentation. The right side of
the neck was prepped and draped in a sterile fashion. Maximal
barrier sterile technique was utilized including caps, mask, sterile
gowns, sterile gloves, sterile drape, hand hygiene and skin
antiseptic. The skin was anesthetized with 1% lidocaine. A 21 gauge
needle was directed into the vein with ultrasound guidance and a
micropuncture dilator set was placed. A wire was advanced into the
IVC. The filter sheath was advanced over the wire into the IVC. An
IVC venogram was performed with carbon dioxide due to renal
insufficiency. Fluoroscopic images were obtained for documentation.
Renal veins were not confidently identified with carbon dioxide.
Therefore, 5 French catheter and Bentson wire were used to cannulate
the bilateral renal veins. Delsy Jager filter was deployed below
the lowest renal vein. Left renal vein was again cannulated with a
catheter and wire to ensure placement below the renal vein. Final
spot radiograph images were obtained. Vascular sheath was removed
with manual compression. Bandage placed over the puncture site.
FINDINGS: IVC is small but patent. Bilateral renal veins were identified by
cannulating the veins with a catheter and wire. The filter was
deployed below the lowest renal vein.
IMPRESSION: Successful placement of a retrievable IVC filter.

PLAN:
This IVC filter is potentially retrievable. The patient will be
approximately 8-12 weeks. Further recommendations regarding filter
retrieval, continued surveillance or declaration of device
permanence, will be made at that time.
